# Patient Record
Sex: Male | Born: 1935 | Race: White | Hispanic: No | Marital: Married | Smoking: Former smoker
Health system: Southern US, Community
[De-identification: ages and names within clinical notes are randomized; demographics above are authoritative.]

## PROBLEM LIST (undated history)

## (undated) DIAGNOSIS — J9621 Acute and chronic respiratory failure with hypoxia: Principal | ICD-10-CM

## (undated) DIAGNOSIS — M1990 Unspecified osteoarthritis, unspecified site: Secondary | ICD-10-CM

## (undated) DIAGNOSIS — I2510 Atherosclerotic heart disease of native coronary artery without angina pectoris: Secondary | ICD-10-CM

## (undated) DIAGNOSIS — I4821 Permanent atrial fibrillation: Secondary | ICD-10-CM

## (undated) DIAGNOSIS — E782 Mixed hyperlipidemia: Secondary | ICD-10-CM

## (undated) DIAGNOSIS — I10 Essential (primary) hypertension: Secondary | ICD-10-CM

## (undated) HISTORY — DX: Mixed hyperlipidemia: E78.2

## (undated) HISTORY — DX: Permanent atrial fibrillation: I48.21

## (undated) HISTORY — PX: JOINT REPLACEMENT: SHX530

## (undated) HISTORY — DX: Essential (primary) hypertension: I10

## (undated) HISTORY — DX: Atherosclerotic heart disease of native coronary artery without angina pectoris: I25.10

---

## 2005-09-04 ENCOUNTER — Ambulatory Visit: Payer: Self-pay | Admitting: *Deleted

## 2005-09-10 ENCOUNTER — Encounter (HOSPITAL_COMMUNITY): Admission: RE | Admit: 2005-09-10 | Discharge: 2005-10-10 | Payer: Self-pay | Admitting: *Deleted

## 2005-09-10 ENCOUNTER — Ambulatory Visit: Payer: Self-pay | Admitting: *Deleted

## 2005-09-10 ENCOUNTER — Encounter: Payer: Self-pay | Admitting: Cardiology

## 2005-09-11 ENCOUNTER — Encounter: Payer: Self-pay | Admitting: Cardiology

## 2005-09-12 ENCOUNTER — Encounter: Payer: Self-pay | Admitting: Cardiology

## 2005-09-12 ENCOUNTER — Ambulatory Visit: Payer: Self-pay | Admitting: *Deleted

## 2005-09-12 ENCOUNTER — Ambulatory Visit (HOSPITAL_COMMUNITY): Admission: RE | Admit: 2005-09-12 | Discharge: 2005-09-12 | Payer: Self-pay | Admitting: *Deleted

## 2005-09-20 ENCOUNTER — Ambulatory Visit: Payer: Self-pay | Admitting: Cardiology

## 2005-09-20 ENCOUNTER — Encounter: Payer: Self-pay | Admitting: Cardiology

## 2005-09-20 ENCOUNTER — Inpatient Hospital Stay (HOSPITAL_BASED_OUTPATIENT_CLINIC_OR_DEPARTMENT_OTHER): Admission: RE | Admit: 2005-09-20 | Discharge: 2005-09-20 | Payer: Self-pay | Admitting: Cardiology

## 2005-09-26 ENCOUNTER — Ambulatory Visit: Payer: Self-pay | Admitting: *Deleted

## 2006-11-04 ENCOUNTER — Ambulatory Visit: Payer: Self-pay | Admitting: Cardiology

## 2007-10-23 ENCOUNTER — Ambulatory Visit: Payer: Self-pay | Admitting: Cardiology

## 2008-10-27 ENCOUNTER — Ambulatory Visit: Payer: Self-pay | Admitting: Cardiology

## 2009-11-07 DIAGNOSIS — E785 Hyperlipidemia, unspecified: Secondary | ICD-10-CM

## 2009-11-07 DIAGNOSIS — I10 Essential (primary) hypertension: Secondary | ICD-10-CM

## 2009-11-07 DIAGNOSIS — I2510 Atherosclerotic heart disease of native coronary artery without angina pectoris: Secondary | ICD-10-CM

## 2009-11-09 ENCOUNTER — Ambulatory Visit: Payer: Self-pay | Admitting: Cardiology

## 2009-12-26 ENCOUNTER — Encounter: Payer: Self-pay | Admitting: Cardiology

## 2010-07-24 ENCOUNTER — Encounter: Payer: Self-pay | Admitting: Cardiology

## 2010-09-28 ENCOUNTER — Encounter: Payer: Self-pay | Admitting: Cardiology

## 2010-11-08 ENCOUNTER — Encounter: Payer: Self-pay | Admitting: Cardiology

## 2010-11-08 ENCOUNTER — Ambulatory Visit
Admission: RE | Admit: 2010-11-08 | Discharge: 2010-11-08 | Payer: Self-pay | Source: Home / Self Care | Attending: Cardiology | Admitting: Cardiology

## 2010-11-08 DIAGNOSIS — I4821 Permanent atrial fibrillation: Secondary | ICD-10-CM | POA: Insufficient documentation

## 2010-11-14 ENCOUNTER — Encounter (INDEPENDENT_AMBULATORY_CARE_PROVIDER_SITE_OTHER): Payer: Self-pay | Admitting: *Deleted

## 2010-11-14 NOTE — Assessment & Plan Note (Signed)
Summary: yearly   Visit Type:  Follow-up Primary Provider:  DR [name]  CC:  no complaints.  History of Present Illness: Craig [name] returned for followup management of his coronary heart disease and hypertension.  In 2006, he was evaluated by Dr. Dorethea Clan because of an abnormal electrocardiogram showing anterior T- wave changes.  He had an echocardiogram which showed LVH, but good LV systolic function.  He had a catheterization which showed mild nonobstructive disease with 30% lesions in several areas.  He has done well since that time.  He preferred to follow up with me and I have seen him annually for the last couple of years.    He said he has had no recent chest pain, shortness of breath, or palpitations.   Current Medications (verified): 1)  Furosemide 20 Mg Tabs (Furosemide) .... Take One Tablet By Mouth Daily. 2)  Doxazosin Mesylate 4 Mg Tabs (Doxazosin Mesylate) .Marland Kitchen.. 1 Tab Once Daily 3)  Gemfibrozil 600 Mg Tabs (Gemfibrozil) .Marland Kitchen.. 1 Tab Two Times A Day 4)  Metoprolol Succinate 25 Mg Xr24h-Tab (Metoprolol Succinate) .... Take One Tablet By Mouth Daily 5)  Lipitor 20 Mg Tabs (Atorvastatin Calcium) .... Take One Tablet By Mouth Daily. 6)  Amlodipine Besylate 5 Mg Tabs (Amlodipine Besylate) .... Take One Tablet By Mouth Daily 7)  Nu Iron 150mg  .... 1 Tab Two Times A Day 8)  Clotramazole .... Cream For Dry Skin 9)  Bethamethasone .... Cream Dry Skin 10)  Dipropionate .... Cream For Dry Skin  Allergies (verified): No Known Drug Allergies  Past History:  Past Medical History: Reviewed history from 11/07/2009 and no changes required. Current Problems:  CAD (ICD-414.00) HYPERLIPIDEMIA (ICD-272.4) HYPERTENSION (ICD-401.9)  Review of Systems       ROS is negative except as outlined in HPI.   Vital Signs:  Patient profile:   76 year old male Height:      73 inches Weight:      229 pounds BMI:     30.32 Pulse rate:   74 / minute BP sitting:   150 / 77  (left  arm) Cuff size:   large  Vitals Entered By: Burnett Kanaris, CNA (November 09, 2009 11:20 AM)  Physical Exam  Additional Exam:  Gen. Well-nourished, in no distress   Neck: No JVD, thyroid not enlarged, no carotid bruits Lungs: No tachypnea, clear without rales, rhonchi or wheezes Cardiovascular: Rhythm regular, PMI not displaced,  heart sounds  normal, no murmurs or gallops, no peripheral edema, pulses normal in all 4 extremities. Abdomen: BS normal, abdomen soft and non-tender without masses or organomegaly, no hepatosplenomegaly. MS: No deformities, no cyanosis or clubbing   Neuro:  No focal sns   Skin:  no lesions    Impression & Recommendations:  Problem # 1:  HYPERTENSION (ICD-401.9) His BP is up today and we will increase the amlodipine.  Our target is SBP < 130-135. His updated medication list for this problem includes:    Furosemide 20 Mg Tabs (Furosemide) .Marland Kitchen... Take one tablet by mouth daily.    Doxazosin Mesylate 4 Mg Tabs (Doxazosin mesylate) .Marland Kitchen... 1 tab once daily    Metoprolol Succinate 25 Mg Xr24h-tab (Metoprolol succinate) .Marland Kitchen... Take one tablet by mouth daily    Amlodipine Besylate 10 Mg Tabs (Amlodipine besylate) .Marland Kitchen... Take one tablet by mouth daily  Orders: EKG w/ Interpretation (93000)  Problem # 2:  HYPERLIPIDEMIA (ICD-272.4) This will be checked by Dr Sherril Croon. His updated medication list for this problem includes:  Gemfibrozil 600 Mg Tabs (Gemfibrozil) .Marland Kitchen... 1 tab two times a day    Lipitor 20 Mg Tabs (Atorvastatin calcium) .Marland Kitchen... Take one tablet by mouth daily.  Orders: EKG w/ Interpretation (93000)  Problem # 3:  CAD (ICD-414.00) He has non-obst CAD.  He should be managed as secondary prevention. His updated medication list for this problem includes:    Metoprolol Succinate 25 Mg Xr24h-tab (Metoprolol succinate) .Marland Kitchen... Take one tablet by mouth daily    Amlodipine Besylate 10 Mg Tabs (Amlodipine besylate) .Marland Kitchen... Take one tablet by mouth daily  Orders: EKG  w/ Interpretation ([address])  Patient Instructions: 1)  Your physician has recommended you make the following change in your medication: 1) Increase Norvasc (amlodipine) to 10mg  once daily.  2)  Your physician wants you to follow-up in: 1 year with Dr. Lewayne Bunting in the [place] office.  You will receive a reminder letter in the mail two months in advance. If you don't receive a letter, please call our office to schedule the follow-up appointment. Prescriptions: AMLODIPINE BESYLATE 10 MG TABS (AMLODIPINE BESYLATE) Take one tablet by mouth daily  #90 x 3   Entered by:   Sherri Rad, RN, BSN   Authorized by:   Lenoria Farrier, MD, [facility]   Signed by:   Sherri Rad, RN, BSN on 11/09/2009   Method used:   Electronically to        [facility] (mail-order)       [address]       [place], [place]  [address]       Ph: [phone]       Fax: [phone]   RxID:   [id number] LIPITOR 20 MG TABS (ATORVASTATIN CALCIUM) Take one tablet by mouth daily.  #90 x 3   Entered by:   Sherri Rad, RN, BSN   Authorized by:   Lenoria Farrier, MD, [facility]   Signed by:   Sherri Rad, RN, BSN on 11/09/2009   Method used:   Electronically to        [facility] (mail-order)       [address]       [place], [place]  [address]       Ph: [phone]       Fax: [phone]   RxID:   [id number] METOPROLOL SUCCINATE 25 MG XR24H-TAB (METOPROLOL SUCCINATE) Take one tablet by mouth daily  #90 x 3   Entered by:   Sherri Rad, RN, BSN   Authorized by:   Lenoria Farrier, MD, [facility]   Signed by:   Sherri Rad, RN, BSN on 11/09/2009   Method used:   Electronically to        [facility] (mail-order)       [address]       [place], [place]  [address]       Ph: [phone]       Fax: [phone]   RxID:   [id number] GEMFIBROZIL 600 MG TABS (GEMFIBROZIL) 1 tab two times a day  #180 x 3   Entered by:   Sherri Rad, RN, BSN   Authorized  by:   Lenoria Farrier, MD, [facility]   Signed by:   Sherri Rad, RN, BSN on 11/09/2009   Method used:   Electronically to        [facility] (mail-order)       [address]       [place], [place]  [address]  Ph: [phone]       Fax: [phone]   RxID:   [id number] DOXAZOSIN MESYLATE 4 MG TABS (DOXAZOSIN MESYLATE) 1 tab once daily  #90 x 3   Entered by:   Sherri Rad, RN, BSN   Authorized by:   Lenoria Farrier, MD, [facility]   Signed by:   Sherri Rad, RN, BSN on 11/09/2009   Method used:   Electronically to        [facility] (mail-order)       [address]       [place], [place]  [address]       Ph: [phone]       Fax: [phone]   RxID:   [id number] FUROSEMIDE 20 MG TABS (FUROSEMIDE) Take one tablet by mouth daily.  #90 x 3   Entered by:   Sherri Rad, RN, BSN   Authorized by:   Lenoria Farrier, MD, [facility]   Signed by:   Sherri Rad, RN, BSN on 11/09/2009   Method used:   Electronically to        [facility] (mail-order)       [address]       [place], [place]  [address]       Ph: [phone]       Fax: [phone]   RxID:   [id number]

## 2010-11-15 ENCOUNTER — Ambulatory Visit: Payer: Self-pay

## 2010-11-16 NOTE — Cardiovascular Report (Signed)
Summary: Cardiac Catheterization  Cardiac Catheterization   Imported By: Dorise Hiss 11/08/2010 09:11:24  _____________________________________________________________________  External Attachment:    Type:   Image     Comment:   External Document

## 2010-11-16 NOTE — Assessment & Plan Note (Signed)
Summary: 1 yr fu per jan reminder   Visit Type:  Follow-up Primary Provider:  DR [name]  CC:  patient need surgical clearance left hip replacement.  History of Present Illness: the patient is a 76 year old male a former patient of Dr. Charlies Constable.  The patient has been evaluated several years ago in 2006 with a cardiac catheterization because of an abnormal EKG with significant T wave inversions.  The patient had nonobstructive coronary artery disease.  The patient brought all the way from [facility] and has not transferred to his care to the [facility] office because of Dr. Marian Sorrow retirement.  The patient states that he's been doing well.  He denies any chest pain shortness of breath orthopnea or PND.  However he is limited in his physical activity due " hip pain".  A matter of fact the patient is contemplating possible future surgery on the right hip joint.  Routine EKG today and on physical examination we established that the patient has new onset atrial fibrillation.  Duration is unknown.  The patient is entirely asymptomatic with this.  His heart rate appears to be controlled at 96 beats/min.  He reports no palpitations.  He has not had presyncope or syncope.  The patient's score for thromboembolic risk is low at one.  He only has a history of hypertension.  Prior echocardiogram demonstrated normal LV function and the patient has no clinical findings or historical findings of congestive heart failure to suggest that he may have developed tachycardia-induced cardiomyopathy  Preventive Screening-Counseling & Management  Alcohol-Tobacco     Smoking Status: quit     Year Quit: 1984  Current Medications (verified): 1)  Furosemide 20 Mg Tabs (Furosemide) .... Take One Tablet By Mouth Daily. 2)  Doxazosin Mesylate 4 Mg Tabs (Doxazosin Mesylate) .Marland Kitchen.. 1 Tab Once Daily 3)  Gemfibrozil 600 Mg Tabs (Gemfibrozil) .Marland Kitchen.. 1 Tab Two Times A Day 4)  Metoprolol Succinate 25 Mg Xr24h-Tab (Metoprolol  Succinate) .... Take One Tablet By Mouth Daily 5)  Lipitor 20 Mg Tabs (Atorvastatin Calcium) .... Take One Tablet By Mouth Daily. 6)  Amlodipine Besylate 10 Mg Tabs (Amlodipine Besylate) .... Take One Tablet By Mouth Daily 7)  Bethamethasone .... Cream Dry Skin 8)  Allopurinol 300 Mg Tabs (Allopurinol) .... Take 1 Tablet By Mouth Once A Day 9)  Colcrys 0.6 Mg Tabs (Colchicine) .... Take 1 Tablet By Mouth Two Times A Day 10)  Aspir-Low 81 Mg Tbec (Aspirin) .... Take 1 Tablet By Mouth Once A Day  Allergies (verified): No Known Drug Allergies  Comments:  Nurse/Medical Assistant: The patient's medication list and allergies were reviewed with the patient and were updated in the Medication and Allergy Lists.  Past History:  Risk Factors: Smoking Status: quit (11/08/2010)  Past Medical History: Current Problems:  CAD (ICD-414.00) cardiac catheterization in 2006 with no obstructive coronary arteries disease normal LV function. HYPERLIPIDEMIA (ICD-272.4) HYPERTENSION (ICD-401.9) atrial fibrillation new onset diagnosed her clinic visits November 07, 2009  Family History: Reviewed history and no changes required. Negative FH of Diabetes, Hypertension, or Coronary Artery Disease  Social History: Reviewed history from 11/07/2009 and no changes required. His daughter Amy ,who has been a PA, I think is living now in [place], expecting her second child.Smoking Status:  quit  Review of Systems  The patient denies fatigue, malaise, fever, weight gain/loss, vision loss, decreased hearing, hoarseness, chest pain, palpitations, shortness of breath, prolonged cough, wheezing, sleep apnea, coughing up blood, abdominal pain, blood in stool, nausea,  vomiting, diarrhea, heartburn, incontinence, blood in urine, muscle weakness, joint pain, leg swelling, rash, skin lesions, headache, fainting, dizziness, depression, anxiety, enlarged lymph nodes, easy bruising or bleeding, and environmental  allergies.         right-sided hip pain  Vital Signs:  Patient profile:   76 year old male Height:      73 inches Weight:      218 pounds BMI:     28.87 Pulse rate:   98 / minute BP sitting:   127 / 83  (left arm) Cuff size:   large  Vitals Entered By: Carlye Grippe (November 08, 2010 9:08 AM)  Nutrition Counseling: Patient's BMI is greater than 25 and therefore counseled on weight management options. CC: patient need surgical clearance left hip replacement   Physical Exam  Additional Exam:  General: Well-developed, well-nourished in no distress head: Normocephalic and atraumatic eyes PERRLA/EOMI intact, conjunctiva and lids normal nose: No deformity or lesions mouth normal dentition, normal posterior pharynx neck: Supple, no JVD.  No masses, thyromegaly or abnormal cervical nodes lungs: Normal breath sounds bilaterally without wheezing.  Normal percussion heart: irregular rate and rhythm with normal S1 and S2, no S3 or S4.  PMI is normal.  No pathological murmurs abdomen: Normal bowel sounds, abdomen is soft and nontender without masses, organomegaly or hernias noted.  No hepatosplenomegaly musculoskeletal: Back normal, normal gait muscle strength and tone normal pulsus: Pulse is normal in all 4 extremities Extremities: No peripheral pitting edema neurologic: Alert and oriented x 3 skin: Intact without lesions or rashes cervical nodes: No significant adenopathy psychologic: Normal affect    EKG  Procedure date:  11/08/2010  Findings:      atrial fibrillation heart rate 96 beats/min.  Chronic T wave inversions in inferolateral leads no change from prior tracing  Impression & Recommendations:  Problem # 1:  CAD (ICD-414.00) the patient has nonobstructive coronary artery disease.  He has no chest pain.  No further workup is needed.  He needs to continue with risk factor modification.  His EKG does shows significant T-wave inversions which are old and have bled in the  past for cardiac catheterization.  I provided the patient and copy of his EKG to carry with him at all times His updated medication list for this problem includes:    Metoprolol Succinate 25 Mg Xr24h-tab (Metoprolol succinate) .Marland Kitchen... Take one tablet by mouth daily    Amlodipine Besylate 10 Mg Tabs (Amlodipine besylate) .Marland Kitchen... Take one tablet by mouth daily    Aspir-low 81 Mg Tbec (Aspirin) .Marland Kitchen... Take 1 tablet by mouth once a day  Orders: EKG w/ Interpretation ([address])  Problem # 2:  HYPERLIPIDEMIA (ICD-272.4) followed by Dr. Sherril Croon.  patient is scheduled for blood work next week.really referred her management to his primary care physician His updated medication list for this problem includes:    Gemfibrozil 600 Mg Tabs (Gemfibrozil) .Marland Kitchen... 1 tab two times a day    Lipitor 20 Mg Tabs (Atorvastatin calcium) .Marland Kitchen... Take one tablet by mouth daily.  Problem # 3:  HYPERTENSION (ICD-401.9) blood pressure is well controlled.  There is no need to change her medications. His updated medication list for this problem includes:    Furosemide 20 Mg Tabs (Furosemide) .Marland Kitchen... Take one tablet by mouth daily.    Doxazosin Mesylate 4 Mg Tabs (Doxazosin mesylate) .Marland Kitchen... 1 tab once daily    Metoprolol Succinate 25 Mg Xr24h-tab (Metoprolol succinate) .Marland Kitchen... Take one tablet by mouth daily    Amlodipine Besylate  10 Mg Tabs (Amlodipine besylate) .Marland Kitchen... Take one tablet by mouth daily    Aspir-low 81 Mg Tbec (Aspirin) .Marland Kitchen... Take 1 tablet by mouth once a day  Problem # 4:  FIBRILLATION, ATRIAL (ICD-427.31)  the patient has developed new onset atrial fibrillation since his last office visit with Dr. Dickie La.  However the duration is unknown the patient is entirely asymptomatic with this rhythm.  His heart rate at rest also appears to be well-controlled.  However we do not know on physical activity and doing daily activity if he has reasonable rate control.  We will provide him with a 48-hour Holter monitor and maintain his heart  rate being 90 to 110 beats/min.  He may not need any further adjustments in rate controlling agents.  There is no evidence clinically that the patient has a tachycardia-induced cardiomyopathy.  We also discussed the risk of stroke associated with this rhythm.  The patient understands that his risk for stroke is relatively low and only has one risk factor which is hypertension.  At this point we will hold off on anticoagulation after discussing the pros and cons with the patient, but will reconsider anticoagulation at age 18 when he picks up an additional risk factor. His updated medication list for this problem includes:    Metoprolol Succinate 25 Mg Xr24h-tab (Metoprolol succinate) .Marland Kitchen... Take one tablet by mouth daily    Aspir-low 81 Mg Tbec (Aspirin) .Marland Kitchen... Take 1 tablet by mouth once a day  Orders: Holter Monitor (Holter Monitor) T-TSH [phone])  Patient Instructions: 1)  48 hour holter monitor  2)  Follow up in  6 months     Other Screening   PSA: Not documented   Smoking status: quit  (11/08/2010)   Hypertension   Last Blood Pressure: 127 / 83  (11/08/2010)   Serum creatinine: Not documented   Serum potassium Not documented  Self-Management Support :    Hypertension self-management support: Not documented    Lipid self-management support: Not documented

## 2010-11-17 ENCOUNTER — Encounter: Payer: Self-pay | Admitting: Cardiology

## 2010-11-17 ENCOUNTER — Ambulatory Visit (INDEPENDENT_AMBULATORY_CARE_PROVIDER_SITE_OTHER): Payer: Medicare Other

## 2010-11-17 DIAGNOSIS — I4891 Unspecified atrial fibrillation: Secondary | ICD-10-CM

## 2010-11-18 DIAGNOSIS — I4891 Unspecified atrial fibrillation: Secondary | ICD-10-CM

## 2010-11-22 ENCOUNTER — Encounter: Payer: Self-pay | Admitting: Cardiology

## 2010-11-22 NOTE — Letter (Signed)
Summary: Letter/ [facility]  Letter/ [facility]   Imported By: Dorise Hiss 11/16/2010 16:07:38  _____________________________________________________________________  External Attachment:    Type:   Image     Comment:   External Document

## 2010-11-22 NOTE — Letter (Signed)
Summary: Engineer, materials at [facility]  [address]   [place], [place] [address]   Phone: [phone]  Fax: [phone]        November 14, 2010 MRN: [id number]   [facility] [address] [place], [place]  [address]   Dear Mr. [facility],  Your test ordered by Selena Batten has been reviewed by your physician (or physician assistant) and was found to be normal or stable. Your physician (or physician assistant) felt no changes were needed at this time.  ____ Echocardiogram  ____ Cardiac Stress Test  __X__ Lab Work - TSH (thyroid)  ____ Peripheral vascular study of arms, legs or neck  ____ CT scan or X-ray  ____ Lung or Breathing test  ____ Other:   Thank you.   Hoover Brunette, LPN    Duane Boston, M.D., F.A.C.C. Thressa Sheller, M.D., F.A.C.C. Oneal Grout, M.D., F.A.C.C. Cheree Ditto, M.D., F.A.C.C. Daiva Nakayama, M.D., F.A.C.C. Kenney Houseman, M.D., F.A.C.C. Jeanne Ivan, PA-C

## 2010-11-30 NOTE — Assessment & Plan Note (Signed)
Summary: 48 HOUR HOLTER-JM  CC: 48 holter placed   CC:  48 holter placed.  Allergies: No Known Drug Allergies

## 2010-12-06 NOTE — Procedures (Addendum)
Summary: Holter and Event/ [facility]ARD[facility]  Holter and Event/ [facility]   Imported By: Dorise Hiss 12/01/2010 16:55:56  _____________________________________________________________________  External Attachment:    Type:   Image     Comment:   External Document  Appended Document: Holter and Event/ [facility] increase metoprolol E. R. 50 milligrams p.o. daily.  Patientneeds to take his dose early in the morning.folder shows episodes of rapid atrial for relation around 6 p.m. and again around 6 a.m. in the morning.  Appended Document: Holter and Event/ [facility] Patient informed of the above. new rx sent to [facility]

## 2011-02-14 ENCOUNTER — Telehealth: Payer: Self-pay | Admitting: Cardiology

## 2011-02-14 NOTE — Telephone Encounter (Signed)
All Cardiac faxed to [facility] @ [phone]  02/14/11/km

## 2011-02-22 ENCOUNTER — Encounter (HOSPITAL_COMMUNITY): Payer: Medicare Other

## 2011-02-22 ENCOUNTER — Other Ambulatory Visit: Payer: Self-pay | Admitting: Orthopedic Surgery

## 2011-02-22 ENCOUNTER — Other Ambulatory Visit (HOSPITAL_COMMUNITY): Payer: Self-pay | Admitting: Orthopedic Surgery

## 2011-02-22 ENCOUNTER — Ambulatory Visit (HOSPITAL_COMMUNITY)
Admission: RE | Admit: 2011-02-22 | Discharge: 2011-02-22 | Disposition: A | Discharge status: Home / Self Care | Payer: Medicare Other | Source: Ambulatory Visit | Attending: Orthopedic Surgery | Admitting: Orthopedic Surgery

## 2011-02-22 DIAGNOSIS — Z01812 Encounter for preprocedural laboratory examination: Secondary | ICD-10-CM | POA: Insufficient documentation

## 2011-02-22 DIAGNOSIS — Z01818 Encounter for other preprocedural examination: Secondary | ICD-10-CM | POA: Insufficient documentation

## 2011-02-22 DIAGNOSIS — I4891 Unspecified atrial fibrillation: Secondary | ICD-10-CM | POA: Insufficient documentation

## 2011-02-22 DIAGNOSIS — I10 Essential (primary) hypertension: Secondary | ICD-10-CM | POA: Insufficient documentation

## 2011-02-22 LAB — COMPREHENSIVE METABOLIC PANEL
ALT: 10 U/L (ref 0–53)
AST: 18 U/L (ref 0–37)
Albumin: 4.1 g/dL (ref 3.5–5.2)
Alkaline Phosphatase: 101 U/L (ref 39–117)
BUN: 12 mg/dL (ref 6–23)
CO2: 29 mEq/L (ref 19–32)
Calcium: 10.8 mg/dL — ABNORMAL HIGH (ref 8.4–10.5)
Chloride: 103 mEq/L (ref 96–112)
Creatinine, Ser: 1.06 mg/dL (ref 0.4–1.5)
GFR calc Af Amer: >60 mL/min (ref >60)
GFR calc non Af Amer: >60 mL/min (ref >60)
Glucose, Bld: 89 mg/dL (ref 70–99)
Potassium: 3.9 mEq/L (ref 3.5–5.1)
Sodium: 140 mEq/L (ref 135–145)
Total Bilirubin: 0.5 mg/dL (ref 0.3–1.2)
Total Protein: 7 g/dL (ref 6.0–8.3)

## 2011-02-22 LAB — URINALYSIS, ROUTINE W REFLEX MICROSCOPIC
Bilirubin Urine: NEGATIVE
Hgb urine dipstick: NEGATIVE
Ketones, ur: NEGATIVE mg/dL
Specific Gravity, Urine: 1.01 (ref 1.005–1.030)
Urobilinogen, UA: 0.2 mg/dL (ref 0.0–1.0)
pH: 6.5 (ref 5.0–8.0)

## 2011-02-22 LAB — DIFFERENTIAL
Basophils Absolute: 0 10*3/uL (ref 0.0–0.1)
Basophils Relative: 0 % (ref 0–1)
Eosinophils Absolute: 0.1 10*3/uL (ref 0.0–0.7)
Eosinophils Relative: 3 % (ref 0–5)
Lymphocytes Relative: 20 % (ref 12–46)
Lymphs Abs: 1 10*3/uL (ref 0.7–4.0)
Monocytes Absolute: 0.4 10*3/uL (ref 0.1–1.0)
Monocytes Relative: 9 % (ref 3–12)
Neutro Abs: 3.4 10*3/uL (ref 1.7–7.7)
Neutrophils Relative %: 69 % (ref 43–77)

## 2011-02-22 LAB — CBC
Hemoglobin: 15.7 g/dL (ref 13.0–17.0)
MCH: 33.3 pg (ref 26.0–34.0)
MCHC: 33.8 g/dL (ref 30.0–36.0)
MCV: 98.7 fL (ref 78.0–100.0)
Platelets: 183 10*3/uL (ref 150–400)
RBC: 4.71 MIL/uL (ref 4.22–5.81)

## 2011-02-22 LAB — PROTIME-INR
INR: 1.4 (ref 0.00–1.49)
Prothrombin Time: 17.4 seconds — ABNORMAL HIGH (ref 11.6–15.2)

## 2011-02-22 LAB — SURGICAL PCR SCREEN
MRSA, PCR: NEGATIVE
Staphylococcus aureus: NEGATIVE

## 2011-02-22 LAB — APTT: aPTT: 45 seconds — ABNORMAL HIGH (ref 24–37)

## 2011-02-27 NOTE — Assessment & Plan Note (Signed)
[facility]                            CARDIOLOGY OFFICE NOTE   NAME:Craig [name], Craig [name]                        MRN:          [id number]  DATE:10/23/2007                            DOB:          1935-10-02    Primary care physician is previously Dr. Eliberto Ivory.   CLINICAL HISTORY:  Craig [name] is 76 years old and was evaluated by Dr.  Dorethea Clan in 2006 with an abnormal ECG.  He had an echocardiogram which  showed LVH but was otherwise normal and a Cardiolite scan which  suggested inferior ischemia.  He had a catheterization and was found to  have mild nonobstructive coronary disease with normal LV function.   He has requested cardiac follow-up here since that time and I saw him  last year and we did not make any change in his therapy.  He has had no  recent chest pain, shortness of breath or palpitations.   PAST MEDICAL HISTORY:  1. Hypertension.  2. Hyperlipidemia.  3. He had previously been a smoker.   CURRENT MEDICATIONS:  Doxazosin, furosemide, gemfibrozil, aspirin,  Toprol and Lipitor.   EXAMINATION:  The blood pressure was 118/90.  There was no venous distension.  Carotid pulses were full without  bruits.  CHEST:  Clear.  The heart rhythm was regular.  No murmurs or gallops.  ABDOMEN:  Soft.  Normal bowel sounds.  There was no hepatosplenomegaly.  Peripheral pulses were full with no peripheral edema.   IMPRESSION:  1. Nonobstructive coronary artery disease.  2. Hypertension with left ventricular hypertrophy by echocardiography.  3. Abnormal electrocardiogram.  4. Hyperlipidemia.   RECOMMENDATIONS:  I think Craig [name] is doing well.  He is scheduled for  a complete physical in [place] next week by Dr. Eliberto Ivory or another doctor in  [place] next week.  We will not  make any change in his medications here today and will plan to see him  back in follow-up in a year.     [name], MD, [facility]  Electronically Signed    BRB/MedQ  DD: 10/23/2007  DT:  10/24/2007  Job #: [id number]   cc:   Weyman Pedro

## 2011-02-27 NOTE — Assessment & Plan Note (Signed)
[facility]                            CARDIOLOGY OFFICE NOTE   NAME:[name], Craig SABOL                        MRN:          [id number]  DATE:10/27/2008                            DOB:          Feb 14, 1935    PRIMARY CARE PHYSICIAN:  Doreen Beam, MD   CLINICAL HISTORY:  Craig [name] returned for followup management of his  coronary heart disease and hypertension.  In 2006, he was evaluated by  Dr. Dorethea [name] because of an abnormal electrocardiogram showing anterior T-  wave changes.  He had an echocardiogram which showed LVH, but good LV  systolic function.  He had a catheterization which showed mild  nonobstructive disease with 30% lesions in several areas.  He has done  well since that time.  He preferred to follow up with me and I have seen  him annually for the last couple of years.  He said he has had no recent  chest pain, shortness of breath, or palpitations.   PAST MEDICAL HISTORY:  Significant for hyperlipidemia and he has  previous history of smoker.  He also has some BPH.   CURRENT MEDICATIONS:  1. Doxazosin.  2. Furosemide 20 mg daily.  3. Gemfibrozil 600 mg b.i.d.  4. Aspirin 81 mg daily.  5. Toprol 25 mg daily.  6. Lipitor 20 mg nightly.   SOCIAL HISTORY:  His daughter Craig [name], who has been a PA, I think is living  now in [place], expecting her second child.   PHYSICAL EXAMINATION:  VITAL SIGNS:  Today, the blood pressure is 160/78  and pulse 85 and regular.  NECK:  There is no venous distention.  The carotid pulses were full  without bruits.  CHEST:  Clear.  CARDIAC:  Rhythm was regular.  I could hear no murmurs or gallops.  ABDOMEN:  Soft with normal bowel sounds.  There is no  hepatosplenomegaly.  EXTREMITIES:  Peripheral pulses are full with no peripheral edema.   Electrocardiogram showed sinus rhythm with anterior T-wave changes from  V3 to V6.   IMPRESSION:  1. Hypertension with left ventricular hypertrophy by echo and  ECG.  2. Hyperlipidemia.  3. Nonobstructive coronary artery disease at catheterization.   RECOMMENDATIONS:  I think Craig [name] is stable.  His blood pressure is  elevated and we know from his electrocardiogram and his echocardiogram  that he does have LVH, so I think we need to be more aggressive in  controlling his blood pressure.  I doubt going up on his Toprol will be  enough to control his blood pressure and I suggested adding amlodipine.  He has an appointment to see Dr. Sherril Croon in about 2 weeks for a complete  physical and he wanted to wait and see where his blood pressure was  there before making a final decision about starting the medicines.  We  gave him a prescription for amlodipine at 5 mg, but he will defer until  he sees Dr. Sherril Croon about implementing that.  We also documented coronary  artery disease at catheterization.  I think we should target an LDL and  cholesterol of below 80.  I will plan to see him back in followup in a  year.     Craig Elvera Lennox Juanda Chance, MD, [facility]  Electronically Signed    BRB/MedQ  DD: 10/27/2008  DT: 10/28/2008  Job #: [id number]   cc:   Doreen Beam, MD

## 2011-03-01 ENCOUNTER — Inpatient Hospital Stay (HOSPITAL_COMMUNITY): Payer: Medicare Other

## 2011-03-01 ENCOUNTER — Inpatient Hospital Stay (HOSPITAL_COMMUNITY)
Admission: RE | Admit: 2011-03-01 | Discharge: 2011-03-03 | DRG: 470 | Disposition: A | Discharge status: Home Health | Payer: Medicare Other | Source: Ambulatory Visit | Attending: Orthopedic Surgery | Admitting: Orthopedic Surgery

## 2011-03-01 DIAGNOSIS — M169 Osteoarthritis of hip, unspecified: Principal | ICD-10-CM | POA: Diagnosis present

## 2011-03-01 DIAGNOSIS — I10 Essential (primary) hypertension: Secondary | ICD-10-CM | POA: Diagnosis present

## 2011-03-01 DIAGNOSIS — R9431 Abnormal electrocardiogram [ECG] [EKG]: Secondary | ICD-10-CM | POA: Diagnosis present

## 2011-03-01 DIAGNOSIS — M1610 Unilateral primary osteoarthritis, unspecified hip: Principal | ICD-10-CM | POA: Diagnosis present

## 2011-03-01 DIAGNOSIS — D62 Acute posthemorrhagic anemia: Secondary | ICD-10-CM | POA: Diagnosis not present

## 2011-03-01 DIAGNOSIS — E785 Hyperlipidemia, unspecified: Secondary | ICD-10-CM | POA: Diagnosis present

## 2011-03-01 DIAGNOSIS — Z01812 Encounter for preprocedural laboratory examination: Secondary | ICD-10-CM

## 2011-03-01 DIAGNOSIS — I4891 Unspecified atrial fibrillation: Secondary | ICD-10-CM | POA: Diagnosis present

## 2011-03-01 LAB — ABO/RH: ABO/RH(D): O POS

## 2011-03-02 DIAGNOSIS — R9431 Abnormal electrocardiogram [ECG] [EKG]: Secondary | ICD-10-CM

## 2011-03-02 DIAGNOSIS — I4891 Unspecified atrial fibrillation: Secondary | ICD-10-CM

## 2011-03-02 LAB — CROSSMATCH
ABO/RH(D): O POS
Antibody Screen: NEGATIVE
Unit division: 0
Unit division: 0

## 2011-03-02 LAB — CARDIAC PANEL(CRET KIN+CKTOT+MB+TROPI)
Relative Index: 1.8 (ref 0.0–2.5)
Total CK: 249 U/L — ABNORMAL HIGH (ref 7–232)
Troponin I: <0.3 ng/mL (ref <0.30)

## 2011-03-02 LAB — CBC
HCT: 32.7 % — ABNORMAL LOW (ref 39.0–52.0)
MCH: 33.3 pg (ref 26.0–34.0)
MCHC: 33.6 g/dL (ref 30.0–36.0)
MCV: 99.1 fL (ref 78.0–100.0)
Platelets: 135 10*3/uL — ABNORMAL LOW (ref 150–400)
RBC: 3.3 MIL/uL — ABNORMAL LOW (ref 4.22–5.81)
WBC: 5.9 10*3/uL (ref 4.0–10.5)

## 2011-03-02 LAB — BASIC METABOLIC PANEL
BUN: 12 mg/dL (ref 6–23)
Chloride: 104 mEq/L (ref 96–112)
GFR calc non Af Amer: >60 mL/min (ref >60)
Glucose, Bld: 119 mg/dL — ABNORMAL HIGH (ref 70–99)
Potassium: 3.9 mEq/L (ref 3.5–5.1)

## 2011-03-02 NOTE — Procedures (Signed)
Craig [name], Craig [name]                 ACCOUNT NO.:  [id number]   MEDICAL RECORD NO.:  [id number]           PATIENT TYPE:   LOCATION:                                 FACILITY:   PHYSICIAN:  Vida Roller, M.D.   DATE OF BIRTH:  November 21, 1934   DATE OF PROCEDURE:  DATE OF DISCHARGE:                                  ECHOCARDIOGRAM   PRIMARY CARE PHYSICIAN:  Dr. Elby Beck, [place], [place].   INDICATIONS:  This is a gentleman with LVH on EKG and chest pain.  Technical  quality of this study is difficult.   M-MODE TRACINGS:  Aorta is 42 mm.   Left atrium is 50 mm.   Septum is 13 mm.   Posterior wall is 13 mm.   Left ventricular diastolic dimension is 42 mm.   Left ventricular systolic dimension 25 mm.   A 2D AND DOPPLER IMAGING:  The left ventricle is normal size with normal  systolic function.  Estimated ejection fraction 70-75%.  There is mild  concentric left ventricular hypertrophy.  No obvious wall motion  abnormalities are seen.   Right ventricle is normal size with normal systolic function.   Both atria are enlarged.   Aortic valve is sclerotic with no stenosis or regurgitation.   The mitral valve his mild annular calcification with no stenosis or  regurgitation.   Tricuspid valve has trace regurgitation.   There is no pericardial effusion.   The ascending aorta appears to be top normal in size.  The inferior vena  cava appears to be normal size.      Vida Roller, M.D.  Electronically Signed     JH/MEDQ  D:  09/10/2005  T:  09/11/2005  Job:  [id number]   cc:   Elby Beck, M.D.  [place], [place]

## 2011-03-02 NOTE — Cardiovascular Report (Signed)
[name], [name]                 ACCOUNT NO.:  [id number]   MEDICAL RECORD NO.:  [id number]          PATIENT TYPE:  OIB   LOCATION:  1962                         FACILITY:  [facility]   PHYSICIAN:  Charlies Constable, M.D. [facility] DATE OF BIRTH:  03-12-1935   DATE OF PROCEDURE:  09/20/2005  DATE OF DISCHARGE:                              CARDIAC CATHETERIZATION   CLINICAL HISTORY:  [name] is 76 years old and no prior known heart  disease.  I did have a history of hypertension.  He was seen for  preoperative evaluation prior to eye surgery by Dr. Sheffield Slider by Dr. Dorethea Clan.  His ECG was abnormal suggesting a possible old anterior infarction with  lateral T-wave changes.  He had an echocardiogram  which showed normal left  ventricular function and LVH and he had a Cardiolite scan which suggested  inferior ischemia.  For this reason, he was scheduled for evaluation with  cardiac catheterization.   PROCEDURE:  The procedure was done in the outpatient laboratory.  Procedure  was performed via the right femoral arteries and arterial sheath and 4  French preformed coronary catheters.  A front wall arterial puncture was  performed. Omnipaque contrast was used.  The patient tolerated the procedure  well and left the laboratory in satisfactory condition.   RESULTS:  Left main coronary artery:  The left main coronary artery was free  of significant disease.   Left anterior descending coronary artery:  The left anterior descending  coronary artery gave rise to a septal perforator and two diagonal branches.  Note it is irregular but there is no significant obstruction.   Circumflex coronary artery:  Circumflex artery gave rise to a large and  small marginal branch and two posterolateral branches.  Circumflex was  irregular and there was a 30% narrowing in the distal vessel before the  posterolateral branches.   Right coronary artery:  The right coronary artery is a fairly large vessel  that gave rise to a  conus branch, a right ventricular branch, a posterior  descending branch and three posterolateral branches.  The vessel had luminal  irregularities and there was 30% narrowing in the proximal and 30% narrowing  in the mid vessel.   Left ventriculogram:  The left ventriculogram was performed in the RAO  projection, showed good wall motion with no areas of hypokinesis.  The  estimated ejection fraction was 60%.   CONCLUSION:  Mild nonobstructive coronary artery disease with irregularities  in the left anterior descending, 30% narrowing in the distal circumflex  artery, 30% proximal and 30% mid stenosis in the right coronary artery and  normal left ventricular function.   RECOMMENDATIONS:  In view of these findings, I think the abnormal ECG is  probably best explained by hypertensive disease.  The abnormal Cardiolite  scan probably represents a false positive with inferior ischemia.  Will plan  reassurance, secondary risk factor modification and will clear the patient  to proceed with eye surgery.           ______________________________  Charlies Constable, M.D. [facility]     BB/MEDQ  D:  09/20/2005  T:  09/20/2005  Job:  [id number]   cc:   Vida Roller, M.D.  Fax: [phone]   Elby Beck, M.D.   Allen Norris, M.D.  Fax: [phone]   Cardiopulmonary Lab

## 2011-03-02 NOTE — Assessment & Plan Note (Signed)
[facility]                            CARDIOLOGY OFFICE NOTE   NAME:Mathena, Craig [name]                        MRN:          [id number]  DATE:11/04/2006                            DOB:          12-15-1934    NEW PATIENT VISIT   PRIMARY CARE PHYSICIAN:  Dr. Winona Legato.   CLINICAL HISTORY:  Craig [name] is 76 years old and was evaluated by Dr.  Dorethea [name] in late 2006 for a preoperative evaluation prior to eye surgery  by Craig [name].  His ECG was normal suggesting a possible old anterior  infarction with lateral T-wave changes.  An echocardiogram which showed  good LV function but LVH and then a Cardiolite scan which suggested  inferior ischemia.  He underwent catheterization in the outpatient  laboratory and was found to have nonobstructive disease with 30%  narrowing of the distal circumflex artery, 30% proximal, and 30% mid  stenosis in the right coronary artery, and normal LV function.   Craig [name] left practice in [place] and Craig [name] has formally been a  patient of Craig [name] but when he went to the hospital Craig [name] has  assumed his care.  He came here to get established with Cardiology here  in [place].  He says he has had no recent chest pain, shortness of  breath, or palpitations.   PAST MEDICAL HISTORY:  Significant for hypertension and hyperlipidemia.  He had previously been a smoker.   CURRENT MEDICATIONS:  Include:  1. Doxazosin.  2. Furosemide 20 mg daily.  3. Gemfibrozil.  4. Lipitor.  5. Aspirin.   SOCIAL HISTORY:  He is retired from [employer] in [place].  He is  married and has no children.  He knows Craig [name] who was a PA of ours  in [place] until she moved to [place].   FAMILY HISTORY:  Negative for heart disease.  His father died of  uncertain causes and his mother died of cancer.   REVIEW OF SYSTEMS:  Negative except for some urinary frequency.   EXAMINATION:  The blood pressure is 148/85 and the  pulse 93 and regular.  There was no venous distention.  The carotid pulses were full without  bruits.  CHEST:  Clear.  Cardiac rhythm was regular.  First and second heart sounds were normal.  There was a short, systolic ejection murmur in the left sternal edge.  I  could hear no diastolic murmur.  ABDOMEN:  Soft with normal bowel sounds.  There was no  hepatosplenomegaly.  The peripheral pulses were full and there was no peripheral edema.  MUSCULOSKELETAL:  No deformities.  SKIN:  Warm and dry.  NEUROLOGIC:  No focal neurological signs.   An electrocardiogram showed poor R-wave progression and lateral T-wave  changes.   IMPRESSION:  1. Nonobstructive coronary artery disease.  2. Hypertension with left ventricular hypertrophy by echocardiography      and an abnormal EKG.  3. Hyperlipidemia.   RECOMMENDATIONS:  Craig [name] appears to be stable from a cardiac  standpoint.  His blood pressure is somewhat up and I think he  would  benefit from a beta blocker both for secondary prevention for his  coronary heart disease and for a better control of his blood pressure.  He does have LVH on his echocardiogram indicating that his elevated  blood pressure is having a significant effect.  We will start him on  Toprol XL 25 daily.  He is to have his lipid profile checked by Dr.  Eliberto [name] next month.  I will plan to see him back in followup in a year.     [name], MD, [facility]  Electronically Signed    BRB/MedQ  DD: 11/04/2006  DT: 11/04/2006  Job #: [id number]   cc:   Craig [name]

## 2011-03-03 LAB — CBC
Hemoglobin: 10.5 g/dL — ABNORMAL LOW (ref 13.0–17.0)
Platelets: 117 10*3/uL — ABNORMAL LOW (ref 150–400)
RBC: 3.2 MIL/uL — ABNORMAL LOW (ref 4.22–5.81)
RDW: 13.5 % (ref 11.5–15.5)
WBC: 5.5 10*3/uL (ref 4.0–10.5)

## 2011-03-03 LAB — BASIC METABOLIC PANEL
BUN: 10 mg/dL (ref 6–23)
CO2: 26 mEq/L (ref 19–32)
Calcium: 9.9 mg/dL (ref 8.4–10.5)
Chloride: 109 mEq/L (ref 96–112)
Creatinine, Ser: 0.84 mg/dL (ref 0.4–1.5)
GFR calc Af Amer: >60 mL/min (ref >60)
GFR calc non Af Amer: >60 mL/min (ref >60)
Glucose, Bld: 118 mg/dL — ABNORMAL HIGH (ref 70–99)
Potassium: 4 mEq/L (ref 3.5–5.1)
Sodium: 139 mEq/L (ref 135–145)

## 2011-03-03 LAB — TSH: TSH: 1.525 u[IU]/mL (ref 0.350–4.500)

## 2011-03-08 NOTE — Consult Note (Signed)
NAMERUCKER, PRIDGEON                 ACCOUNT NO.:  [id number]  MEDICAL RECORD NO.:  [id number]           PATIENT TYPE:  I  LOCATION:  1618                         FACILITY:  [facility]  PHYSICIAN:  Pricilla Riffle, MD, FACCDATE OF BIRTH:  [birth year]  DATE OF CONSULTATION:  03/02/2011 DATE OF DISCHARGE:                                CONSULTATION   PRIMARY CARE PHYSICIAN:  Raphael Gibney, M.[place]. [place].  PRIMARY CARDIOLOGIST:  Learta Codding, M.D., [facility]  CHIEF COMPLAINT:  Atrial fibrillation after a left total hip replacement and an abnormal EKG.  HISTORY OF PRESENT ILLNESS:  Mr. Yom is a 76 year old male with a previous history of nonobstructive coronary artery disease.  He was seen in the office for preoperative evaluation in January 2012 and was noted to be in atrial fibrillation.  He had a Holter monitor after that, but those results are not available.  He is not particularly symptomatic from the atrial fibrillation and came in yesterday for the surgery. Postoperatively, he has done well and was ambulating with physical therapy and rehab today.  He did well with this.  He denies chest pain, dyspnea on exertion, presyncope or syncope.  He has not had lower extremity edema.  He denies orthopnea or PND.  He feels that his respiratory status has been unchanged.  He is aware of the atrial fibrillation and can feel the palpitations, but is not particularly symptomatic secondary to them.  He tolerates the arrhythmia very well.  PAST MEDICAL HISTORY: 1. History of an abnormal EKG with anterolateral T-wave inversions and     cardiac catheterization in 2006 showing no significant obstruction     in the LAD, circumflex and RCA 30% stenosis: 2. Preserved left ventricular function with an EF of 70%-75% and mild     concentric LVH by echocardiogram in 2006. 3. Atrial fibrillation diagnosed on a clinic visit in January 2012. 4. Remote history of tobacco use. 5. Hypertension. 6.  Hyperlipidemia. 7. BPH. 8. Osteoarthritis.  PAST SURGICAL HISTORY:  He is status post eye surgery remotely as well as hip surgery yesterday.  ALLERGIES:  No known drug allergies.  CURRENT MEDICATIONS: 1. Zyloprim 300 mg a day. 2. Norvasc 5 mg a day. 3. Ancef q.6h. 4. Celebrex 200 mg b.i.d. 5. Colace 100 mg b.i.d. 6. Cardura 8 mg a day. 7. Iron 325 mg t.i.d. 8. Lasix 20 mg a day. 9. Lopid 600 mg b.i.d. 10.Dilaudid p.r.n. 11.Toprol XL 50 mg a day. 12.Xarelto 10 mg daily. 13.Crestor 10 mg a day. 14.Senokot b.i.d. 15.Flomax 0.4 mg q.h.s.  SOCIAL HISTORY:  He lives in [place], [place] with family nearby. His niece, Amy Mercy Riding was a PA with our service until her husband was transferred to [place].  He quit tobacco back in 1984.  He is retired.  He has no history of alcohol or drug abuse.  FAMILY HISTORY:  Both of his parents are deceased and neither one had coronary artery disease, although his father's cause of death is not known.  No siblings have coronary artery disease.  REVIEW OF SYSTEMS:  He has had significant  joint pain in the left hip prior to surgery.  Since the surgery, his pain has been fairly well- controlled.  He has had no fevers or chills.  He denies lower extremity edema.  He denies melena or GI issues.  Full 14-point review of systems is otherwise negative except as stated in the HPI.  PHYSICAL EXAMINATION:  VITAL SIGNS:  Temperature 98.1, blood pressure 115/66, heart rate 104, respiratory rate 18, O2 saturation 97% on 2 liters. GENERAL:  He is a well-developed elderly white male, in no acute distress. HEENT:  Normal for age. NECK:  There is no lymphadenopathy, thyromegaly, bruit or JVD noted. CV:  His heart is irregular in rate and rhythm with an S1, S2 and a short soft systolic murmur is noted at the left upper sternal border. Distal pulses are intact in upper extremities. SKIN:  No rashes or lesions are noted. ABDOMEN:  Soft and  nontender with active bowel sounds. EXTREMITIES:  There is no cyanosis, clubbing or edema noted.  His incision is bandaged and dressed and without significant drainage. NEUROLOGIC:  He is alert and oriented.  Cranial nerves II through XII grossly intact.  DIAGNOSTIC STUDIES:  Chest x-ray performed on Feb 22, 2011 showed mild COPD, possible left lower lobe pneumonia.  EKG is AFib with RVR, rate 102 with anterolateral T-wave inversions that are not significantly changed from an EKG dated January 2012.  LABORATORY DATA:  Hemoglobin 11.0, hematocrit 32.7, WBC 5.9, platelets 135,000.  Sodium 136, potassium 3.9, chloride 104, CO2 of 26, BUN 12, creatinine 1.05, glucose 119, CK-MB 249/4.6 with an index of 1.8 and troponin I less than 0.30.  IMPRESSION: 1. Mr. Nix was seen today by Dr. Tenny Craw, the patient evaluated and the     data reviewed.  He is a 76 year old male with a history of mild     coronary artery disease, hypertension and atrial fibrillation, who     is postop day #1 of left total hip replacement.  We were asked to     see him regarding his atrial fibrillation.  He is relatively     asymptomatic.  His EKG is abnormal, but relatively unchanged from     previous studies.  With age, hypertension and history of coronary     artery disease, we recommend anticoagulation.  He is currently     getting Xarelto 10 mg and we will increase it to a full dose if     okay with ortho. 2. He should get an echocardiogram, which can be done as an     outpatient. 3. He should be continued on beta blocker and titrate as needed for     heart rate and blood pressure control.  The Norvasc was held this     morning for hypotension and we will continue to hold that in order     to make sure the beta blocker gets on board.  He should be     continued on aspirin and statin because of his history of coronary     artery disease albeit nonobstructive.     Theodore Demark,  PA-C   ______________________________ Pricilla Riffle, MD, [facility]    RB/MEDQ  D:  03/02/2011  T:  03/02/2011  Job:  [id number]  Electronically Signed by Theodore Demark PA-C on 03/08/2011 11:33:10 AM Electronically Signed by Dietrich Pates MD FACC on 03/08/2011 12:57:20 PM

## 2011-03-13 NOTE — Op Note (Signed)
NAMECARLON, CHALOUX                 ACCOUNT NO.:  [id number]  MEDICAL RECORD NO.:  [id number]           PATIENT TYPE:  I  LOCATION:  0005                         FACILITY:  [facility]  PHYSICIAN:  Grissel Tyrell L. Rendall, M.D.  DATE OF BIRTH:  07-04-1935  DATE OF PROCEDURE:  03/01/2011 DATE OF DISCHARGE:                              OPERATIVE REPORT   INDICATIONS AND JUSTIFICATION FOR PROCEDURE:  Painful left hip with bone- against-bone, unremitting pain despite all conservative measures.  PREOPERATIVE DIAGNOSIS:  Osteoarthritis of the left hip.  SURGICAL PROCEDURES:  Left total-hip arthroplasty.  POSTOPERATIVE DIAGNOSIS:  Osteoarthritis of the left hip.  SURGEON:  Tenise Stetler L. Rendall, M.D.  ASSISTANT:  Legrand Pitts. Duffy, P.A.C, present and participating in the entire procedure.  ANESTHESIA:  General.  PATHOLOGY:  Bone-against-bone left hip with an irregularly shaped left femoral head and minimally out of round acetabulum.  PROCEDURE IN DETAIL:  Under general anesthesia, the patient was placed in the right lateral decubitus position.  The hip was draped free with DuraPrep and a posterior approach was used.  The skin incision was carried down through skin and subcutaneous tissue through the IT band. The Charnley retractor was inserted.  Short external rotators and hip capsule were taken down from bone with electrocautery and bleeding was controlled.  Following this, the hip capsule was exposed with a Cobb elevator and opened in a T-shaped manner.  The hip was then dislocated. The superior femoral neck was then exposed with a Mueller and vented and once it was properly exposed, an IM initiator was used to open the canal just medial to the greater trochanter.  A canal finder was then used. Care was taken to be as far lateral as reasonably possible.  Reaming was then done and #5 on the reamers gives excellent canal fill at the level of the greater trochanter.  Femoral neck was then osteotomized  and care was taken to do this several times to get the appropriate neck length and rasps were then used 1, 3 and 5 and the calcar reamer was used to basically smooth this area.  Once the 5 stem rasp was in place and found to be in excellent fit and fill, attention was turned to the acetabulum. It was exposed with two cobras inferiorly and wing retractors superiorly in the interval between the labrum and the hip capsule.  Care was taken to remove the entire labrum.  Transverse acetabular ligament was exposed and mostly trimmed away.  Reaming was then done in the acetabulum 47, 49, 51, 53, 54, 55 and this leads to a excellent fit with ultimately the 58.  We reamed to a 57 briefly and a 58 sector cup was used.  There was a medial acetabular cyst that was encountered and this was packed full of cancellus bone from the femoral head neck.  Once this was packed and appropriately filled in, the pinnacle cup was inserted and one 20-mm screw with excellent bone grip was inserted into the superior hole into the ileum.  With this in place, trial acetabular liners were used, first the +4 10-degrees for a 36  hip ball and a 58 liner.  With the #5 rasp in place, different neck lengths were tried and the high offset with a +5 neck length seems to give the best fit and fill with the leg lengths virtually identical.  The hip was very stable.  With this in place, the hip did not dislocate with flexion and internal rotation or full extension either.  Permanent components were then obtained with the exception of the metal ball.  The apex hole eliminator was inserted, the Marathon polyethylene and the Summit femoral stem 12/14 taper and the size 5 high offset.  The different hip balls were then trialed, a +5 and a +8 and the +5 felt more natural.  This was then used and the leg lengths were felt to be identical.  The hip capsule was then repaired with #1 Tycron.  The piriformis was reattached.  A small incision  in the gluteus medius tendon was repaired.  This was done to avoid damage to the tendon during reaming.  The IT band was then closed with #1 Tycron, subcutaneous with #1 Vicryl, 2-0 Vicryl and skin clips.  Operative time approximately one hour to one hour and 20 minutes.  Blood loss estimated at 800.  The patient was stable through the procedure and returned to recovery in good condition.     Zailey Audia L. Priscille Kluver, M.D.     Renato Gails  D:  03/01/2011  T:  03/01/2011  Job:  [id number]  Electronically Signed by Erasmo Leventhal M.D. on 03/13/2011 05:37:54 PM

## 2011-03-28 NOTE — Discharge Summary (Addendum)
Craig, [name]                 ACCOUNT NO.:  [id number]  MEDICAL RECORD NO.:  [id number]  LOCATION:  1618                         FACILITY:  [facility]  PHYSICIAN:  Ardyth Kelso L. Rendall, M.D.  DATE OF BIRTH:  1934-12-19  DATE OF ADMISSION:  03/01/2011 DATE OF DISCHARGE:  03/03/2011                              DISCHARGE SUMMARY   ADMISSION DIAGNOSES: 1. End-stage osteoarthritis, left hip. 2. History of atrial fibrillation. 3. Hypertension. 4. Hypercholesterolemia. 5. Benign prostatic hypertrophy. 6. Gout.  DISCHARGE DIAGNOSES: 1. End-stage osteoarthritis left hip, status post left total hip     arthroplasty. 2. Acute blood loss anemia secondary to surgery. 3. New onset atrial fibrillation with a history of chronic atrial     fibrillation. 4. Hypertension. 5. Hypercholesterolemia. 6. Benign prostatic hypertrophy. 7. Gout.  SURGICAL PROCEDURE:  On Mar 01, 2011, Craig [name] underwent a left total hip arthroplasty by Dr. Jonny Ruiz L.  Rendall, assisted by Arnoldo Morale, PA-C. He had a DePuy Pinnacle acetabular shell sector 2, 58 mm outer diameter, placed with 1 Pinnacle cancellous bone screw 8.5 x 20 mm.  A apex hole eliminator.  A Pinnacle Marathon polyethylene acetabular liner +4 10 degree 36 mm inner diameter, 58 mm outer diameter.  A Summit femoral stem 12/14 taper with a fixed size 5 and a metal-on-metal femoral head +5 neck length, 12/14 taper.  COMPLICATIONS:  None.  CONSULTS: 1. Physical therapy consult, Mar 02, 2011, in addition to a [facility]     Cardiology consult. 2. Occupational therapy consult, Mar 03, 2011.  HISTORY OF PRESENT ILLNESS:  This 76 year old white male patient presented to Dr. Priscille [name] with a 1-1/2-year history of gradual-onset progressive left hip pain without injury or surgery.  Left hip pain is now constant with weightbearing and dull ache over the lateral hip and groin without radiation.  Pain increases with walking and prolonged sitting and decreases  with rest and some walking.  The hip pops, it might give way.  He can lie on his left side for a short period of time, but he has difficulty putting on his socks and shoes.  He is taking Naprosyn for some relief, but x-rays show end-stage arthritic changes and because of that he is presenting for a left hip replacement.  HOSPITAL COURSE:  Craig [name] tolerated his surgical procedure well without immediate postoperative complications.  He was transferred to the orthopedic floor.  Postop day #1, he was afebrile, vitals were stable, however, his pulse was irregular.  With his history of AFib, a new EKG was ordered and cardiology consult.  His hemoglobin was 11, hematocrit 32.7.  Left hip prosthesis was in good position, alignment, and the leg was neurovascularly intact.  He was started on therapy.  CK, CK-MB, and troponin were done and plans were made for discharge home over the next several days if stable.  Cardiology did see him and recommended medications adjustment and an outpatient echocardiogram.  Postop day #2, he continued to do well, remained afebrile, vitals were stable.  He was tolerating therapy well and no new cardiac issues. Postop, he was able to be discharge home later that day.  MEDICATIONS:  Please see the patient home  med rec discharge sheet.  We did add Celebrex, iron sulfate, Robaxin, Percocet, Xarelto for him. Again you please see the home med discharge sheet for complete documentation of the medications.  DISCHARGE INSTRUCTIONS:  Diet:  He is to resume his regular prehospitalization diet.  Activity:  He can be out of bed, weightbearing as tolerated on the left leg with use of a walker.  No lifting or driving for 6 weeks.  Please see the white total joint discharge sheet for further activity instructions.  Wound Care:  Please see the white total joint discharge sheet for further wound care instructions.  Followup:  He needs to follow up with Dr. Priscille [name] in our  office on Tuesday, May 29th, and needs to call [phone] for that appointment.  If that does not work, he can follow up in [place] with Dr. Cleophas Dunker and then follow up with [place].  He does need to follow up with Dr. Andee Lineman in about 4- 6 weeks.  LABORATORY DATA:  Hemoglobin/hematocrit ranged from 11 and 32.7 on the 18th to 10.5 and 31.7 on the 19th.  Platelets went from 135 on the 18th to 117 on the 19th.  Glucose ranged from 119 on the 18th to 118 on the 19th.  CK was 249 with a CK-MB of 4.6 and a troponin of less than 0.3 on May 18th.  All other laboratory studies were within normal limits.  X-rays taken of the left hip after the total hip done on May 17th showed the prosthesis in good position alignment.     Craig [name], P.A.   ______________________________ Carlisle Beers. Craig [name], M.D.    KED/MEDQ  D:  03/21/2011  T:  03/22/2011  Job:  [id number]  Electronically Signed by Otilio Jefferson. on 03/28/2011 10:33:46 AM Electronically Signed by Erasmo Leventhal M.D. on 03/28/2011 11:21:47 AM

## 2011-05-11 ENCOUNTER — Encounter: Payer: Self-pay | Admitting: Cardiology

## 2011-05-18 ENCOUNTER — Ambulatory Visit (INDEPENDENT_AMBULATORY_CARE_PROVIDER_SITE_OTHER): Payer: Medicare Other | Admitting: Cardiology

## 2011-05-18 ENCOUNTER — Encounter: Payer: Self-pay | Admitting: Cardiology

## 2011-05-18 VITALS — BP 145/81 | HR 97 | Ht 73.0 in | Wt 219.0 lb

## 2011-05-18 DIAGNOSIS — I4891 Unspecified atrial fibrillation: Secondary | ICD-10-CM

## 2011-05-18 DIAGNOSIS — I2510 Atherosclerotic heart disease of native coronary artery without angina pectoris: Secondary | ICD-10-CM

## 2011-05-18 DIAGNOSIS — I10 Essential (primary) hypertension: Secondary | ICD-10-CM

## 2011-05-18 DIAGNOSIS — R9431 Abnormal electrocardiogram [ECG] [EKG]: Secondary | ICD-10-CM

## 2011-05-18 MED ORDER — METOPROLOL SUCCINATE ER 50 MG PO TB24: ORAL_TABLET | ORAL | Status: DC

## 2011-05-18 NOTE — Assessment & Plan Note (Signed)
Nonobstructive CAD by catheterization 2006. No chest pain

## 2011-05-18 NOTE — Assessment & Plan Note (Signed)
Patient has chronically abnormal EKG. He carries a copy with him all the time. He has no significant coronary artery disease associated with this

## 2011-05-18 NOTE — Assessment & Plan Note (Signed)
Overall rate is reasonably controlled between 90-110 beats per minute period however there are still. For the patient's heart rate goes to 140-160. This occurs mainly in the evening and early morning hours we will add a small dose of metoprolol 25 mg to take in the late afternoon.

## 2011-05-18 NOTE — Assessment & Plan Note (Signed)
Blood pressure stable followup with his primary care physician

## 2011-05-18 NOTE — Patient Instructions (Signed)
   Increase Metoprolol to 50mg  every morning & 25mg  every evening Your physician wants you to follow up in:  1 year.  You will receive a reminder letter in the mail one-two months in advance.  If you don't receive a letter, please call our office to schedule the follow up appointment

## 2011-05-18 NOTE — Progress Notes (Signed)
HPI The patient is a 76 year old male former patient of Dr. Dickie La with prior abnormal echocardiogram with significant T wave inversions in the precordial leads. Had a cardiac catheterization in 2006 which showed nonobstructive coronary artery disease. Prior echocardiogram also demonstrated normal LV function. When seen for the first time in this office several months ago the patient was found to be in new-onset atrial fibrillation. We discussed implications of atrial fibrillation as it relates to stroke and risk for heart failure. We also discussed issues of rhythm and rate control. A 48-hour Holter monitor was obtained and the patient's overall heart rate was well-controlled. There were some increasing heart rates between 4 PM and 6 PM as well as early in the morning. The patient however denies any palpitations. He has no presyncope or syncope. He has no orthopnea PND. He stable from a cardiovascular perspective. The patient carries a copy of his EKG with him at all times in the event he presents to the emergency room with chest pain or acute comparison. Of note is that the patient status post hip replacement he 17th. He had no postoperative cardiac complications. He does report some gout in the left knee.   No Known Allergies  Current Outpatient Prescriptions on File Prior to Visit  Medication Sig Dispense Refill  . allopurinol (ZYLOPRIM) 300 MG tablet Take 300 mg by mouth daily.        Marland Kitchen aspirin 81 MG EC tablet Take 81 mg by mouth daily.        Marland Kitchen atorvastatin (LIPITOR) 20 MG tablet Take 20 mg by mouth daily.        . furosemide (LASIX) 20 MG tablet Take 20 mg by mouth daily.        Marland Kitchen gemfibrozil (LOPID) 600 MG tablet Take 600 mg by mouth 2 (two) times daily.        . NON FORMULARY bethamethasone - cream dry skin         Past Medical History  Diagnosis Date  . CAD (coronary artery disease)   . HLD (hyperlipidemia)   . HTN (hypertension)   . Atrial fibrillation      new onset diagnosed her  clinic visits 11/07/09    Past Surgical History  Procedure Date  . Cardiac catheterization 2006    no obstructive CAD; nml LV function     Family History  Problem Relation Age of Onset  . Diabetes Neg Hx   . Hypertension Neg Hx   . Coronary artery disease Neg Hx     History   Social History  . Marital Status: Married    Spouse Name: N/A    Number of Children: N/A  . Years of Education: N/A   Occupational History  . Not on file.   Social History Main Topics  . Smoking status: Former Smoker -- 1.0 packs/day for 30 years    Types: Cigarettes    Quit date: 10/15/1982  . Smokeless tobacco: Never Used  . Alcohol Use: Not on file  . Drug Use: Not on file  . Sexually Active: Not on file   Other Topics Concern  . Not on file   Social History Narrative  . No narrative on file   WUJ:WJXBJYNWG positives as outlined above. The remainder of the 18  point review of systems is negative   PHYSICAL EXAM BP 145/81  Pulse 97  Ht 6\' 1"  (1.854 m)  Wt 219 lb (99.338 kg)  BMI 28.89 kg/m2  General: Well-developed, well-nourished in no distress  Head: Normocephalic and atraumatic Eyes:PERRLA/EOMI intact, conjunctiva and lids normal Ears: No deformity or lesions Mouth:normal dentition, normal posterior pharynx Neck: Supple, no JVD.  No masses, thyromegaly or abnormal cervical nodes Lungs: Normal breath sounds bilaterally without wheezing.  Normal percussion Cardiac: Irregular rate and rhythm  with normal S1 and S2, no S3 or S4.  PMI is normal.  No pathological murmurs Abdomen: Normal bowel sounds, abdomen is soft and nontender without masses, organomegaly or hernias noted.  No hepatosplenomegaly MSK: Back normal, normal gait muscle strength and tone normal Vascular: Pulse is normal in all 4 extremities Extremities: No peripheral pitting edema Neurologic: Alert and oriented x 3 Skin: Intact without lesions or rashes Lymphatics: No significant adenopathy Psychologic: Normal  affect   ECG: Atrial fibrillation with rate control. Significantly inverted T waves anterior precordial leads chronic finding  ASSESSMENT AND PLAN

## 2012-05-09 ENCOUNTER — Telehealth: Payer: Self-pay | Admitting: Cardiology

## 2012-05-12 ENCOUNTER — Ambulatory Visit: Payer: Medicare Other | Admitting: Cardiology

## 2012-05-19 ENCOUNTER — Ambulatory Visit: Payer: Medicare Other | Admitting: Cardiology

## 2012-06-06 ENCOUNTER — Encounter: Payer: Self-pay | Admitting: Cardiology

## 2012-06-06 ENCOUNTER — Ambulatory Visit (INDEPENDENT_AMBULATORY_CARE_PROVIDER_SITE_OTHER): Payer: Medicare Other | Admitting: Cardiology

## 2012-06-06 VITALS — BP 118/75 | HR 92 | Ht 73.0 in | Wt 226.0 lb

## 2012-06-06 DIAGNOSIS — R9431 Abnormal electrocardiogram [ECG] [EKG]: Secondary | ICD-10-CM

## 2012-06-06 DIAGNOSIS — I2510 Atherosclerotic heart disease of native coronary artery without angina pectoris: Secondary | ICD-10-CM

## 2012-06-06 DIAGNOSIS — Z01810 Encounter for preprocedural cardiovascular examination: Secondary | ICD-10-CM

## 2012-06-06 DIAGNOSIS — I4891 Unspecified atrial fibrillation: Secondary | ICD-10-CM

## 2012-06-06 NOTE — Patient Instructions (Addendum)
Continue all current medications. Patient is cleared for surgery per Dr. Andee Lineman. Your physician wants you to follow up in:  1 year.  You will receive a reminder letter in the mail one-two months in advance.  If you don't receive a letter, please call our office to schedule the follow up appointment

## 2012-06-22 DIAGNOSIS — Z01810 Encounter for preprocedural cardiovascular examination: Secondary | ICD-10-CM | POA: Insufficient documentation

## 2012-06-22 NOTE — Assessment & Plan Note (Signed)
Coronary artery disease excluded in 2006.  Patient has no recurrent chest pain.  No indication for further stress testing.

## 2012-06-22 NOTE — Assessment & Plan Note (Signed)
Permanent atrial fibrillation.  Patient apprised 48 hour Holter monitor which showed rate controlled atrial fibrillation.  Your previous office visit we discussed the risk of stroke and risk for heart failure.  Although hi CHADS2 score equals 2 the patient declines any Coumadin.  I also think his risk is acceptably low at this point in time.  Revision of this recommendation and always be decided on in the future.

## 2012-06-22 NOTE — Assessment & Plan Note (Signed)
Patient can proceed with laminectomy by Dr. Ophelia Charter.  The patient at low risk for cardiovascular complications and no further ischemia testing is necessary at this point

## 2012-06-22 NOTE — Assessment & Plan Note (Signed)
Old significant T wave inversions in the precordial leads.  Cardiac catheterization 2006 showed nonobstructive disease.  Echocardiogram also showed no structural heart disease with normal LV function.

## 2012-06-22 NOTE — Progress Notes (Signed)
Peyton Bottoms, MD, [facility] ABIM Board Certified in Adult Cardiovascular Medicine,Internal Medicine and Critical Care Medicine    CC: preoperative evaluation prior to laminectomy.                                                                                 HPI:       The patient 77 year old male with a history of chronic afibrillation not on Coumadin therapy.  The patient has declined this in the past.  He did have a very abnormal EKG with deep T-wave inversions but this was evaluated with a normal catheterization and normal echocardiogram.  She has no peripheral heart disease.  He does have some mild shortness of breath on exertion but this is chronic and he also has no chest pain.  He is scheduled for laminectomy in the next couple of weeks.  We've been asked for cardiovascular clearance.  PMH: reviewed and listed in Problem List in Electronic Records (and see below) Past Medical History  Diagnosis Date  . CAD (coronary artery disease)   . HLD (hyperlipidemia)   . HTN (hypertension)   . Atrial fibrillation      new onset diagnosed her clinic visits 11/07/09   Past Surgical History  Procedure Date  . Cardiac catheterization 2006    no obstructive CAD; nml LV function     Allergies/SH/FHX : available in Electronic Records for review  No Known Allergies History   Social History  . Marital Status: Married    Spouse Name: N/A    Number of Children: N/A  . Years of Education: N/A   Occupational History  . Not on file.   Social History Main Topics  . Smoking status: Former Smoker -- 1.0 packs/day for 30 years    Types: Cigarettes    Quit date: 10/15/1982  . Smokeless tobacco: Never Used  . Alcohol Use: Not on file  . Drug Use: Not on file  . Sexually Active: Not on file   Other Topics Concern  . Not on file   Social History Narrative  . No narrative on file   Family History  Problem Relation Age of Onset  . Diabetes Neg Hx   . Hypertension Neg Hx   . Coronary  artery disease Neg Hx     Medications: Current Outpatient Prescriptions  Medication Sig Dispense Refill  . allopurinol (ZYLOPRIM) 300 MG tablet Take 300 mg by mouth daily.        Marland Kitchen amLODipine (NORVASC) 5 MG tablet Take 1 tablet by mouth Daily.      Marland Kitchen aspirin 81 MG EC tablet Take 81 mg by mouth daily.        Marland Kitchen atorvastatin (LIPITOR) 20 MG tablet Take 20 mg by mouth daily.        Marland Kitchen doxazosin (CARDURA) 8 MG tablet Take 1 tablet by mouth Daily.      . furosemide (LASIX) 20 MG tablet Take 20 mg by mouth daily.        Marland Kitchen gemfibrozil (LOPID) 600 MG tablet Take 600 mg by mouth 2 (two) times daily.        . metoprolol (TOPROL-XL) 50 MG 24 hr tablet Take one  tab (50mg ) every morning & 1/2 tab (25mg ) every evening  135 tablet  3  . naproxen (NAPROSYN) 500 MG tablet Take 500 mg by mouth 2 (two) times daily as needed.        . NON FORMULARY bethamethasone - cream dry skin         ROS: No nausea or vomiting. No fever or chills.No melena or hematochezia.No bleeding.No claudication  Physical Exam: BP 118/75  Pulse 92  Ht 6\' 1"  (1.854 m)  Wt 226 lb (102.513 kg)  BMI 29.82 kg/m2  SpO2 98% General:well-nourished white male in no distress Neck:normal upstroke and no carotid bruits. Lungs:clear breath sounds bilaterally without any wheezing. Cardiac:regular rate and rhythm with normal S1-S2 no murmurs or gallops Vascular:no edema.  Normal distal pulses Skin:skin warm and dry Physcologic:normal affect  12lead ZOX:WRUEAV fibrillation with deeply inverted T waves particularly in the inferolateral leads no change compared to old tracing Limited bedside ECHO:N/A No images are attached to the encounter.   I reviewed and summarized the old records. I reviewed ECG and prior blood work.  Assessment and Plan  Abnormal EKG Old significant T wave inversions in the precordial leads.  Cardiac catheterization 2006 showed nonobstructive disease.  Echocardiogram also showed no structural heart disease with  normal LV function.  CAD Coronary artery disease excluded in 2006.  Patient has no recurrent chest pain.  No indication for further stress testing.  FIBRILLATION, ATRIAL Permanent atrial fibrillation.  Patient apprised 48 hour Holter monitor which showed rate controlled atrial fibrillation.  Your previous office visit we discussed the risk of stroke and risk for heart failure.  Although hi CHADS2 score equals 2 the patient declines any Coumadin.  I also think his risk is acceptably low at this point in time.  Revision of this recommendation and always be decided on in the future.  Preoperative cardiovascular examination Patient can proceed with laminectomy by Dr. Ophelia Charter.  The patient at low risk for cardiovascular complications and no further ischemia testing is necessary at this point    Patient Active Problem List  Diagnosis  . HYPERLIPIDEMIA  . HYPERTENSION  . CAD  . FIBRILLATION, ATRIAL  . Abnormal EKG  . Preoperative cardiovascular examination

## 2012-06-26 ENCOUNTER — Other Ambulatory Visit (HOSPITAL_COMMUNITY): Payer: Self-pay | Admitting: Orthopaedic Surgery

## 2012-07-11 ENCOUNTER — Encounter (HOSPITAL_COMMUNITY): Payer: Self-pay | Admitting: Pharmacy Technician

## 2012-07-15 ENCOUNTER — Encounter (HOSPITAL_COMMUNITY)
Admission: RE | Admit: 2012-07-15 | Discharge: 2012-07-15 | Disposition: A | Discharge status: Home / Self Care | Payer: Medicare Other | Source: Ambulatory Visit | Attending: Orthopaedic Surgery | Admitting: Orthopaedic Surgery

## 2012-07-15 ENCOUNTER — Encounter (HOSPITAL_COMMUNITY): Payer: Self-pay

## 2012-07-15 HISTORY — DX: Unspecified osteoarthritis, unspecified site: M19.90

## 2012-07-15 LAB — COMPREHENSIVE METABOLIC PANEL
ALT: 24 U/L (ref 0–53)
AST: 30 U/L (ref 0–37)
Albumin: 4 g/dL (ref 3.5–5.2)
CO2: 27 mEq/L (ref 19–32)
Calcium: 10.4 mg/dL (ref 8.4–10.5)
Chloride: 101 mEq/L (ref 96–112)
Creatinine, Ser: 0.98 mg/dL (ref 0.50–1.35)
Sodium: 138 mEq/L (ref 135–145)
Total Bilirubin: 0.7 mg/dL (ref 0.3–1.2)

## 2012-07-15 LAB — CBC
MCV: 103.7 fL — ABNORMAL HIGH (ref 78.0–100.0)
Platelets: 140 10*3/uL — ABNORMAL LOW (ref 150–400)
RBC: 4.65 MIL/uL (ref 4.22–5.81)
RDW: 13 % (ref 11.5–15.5)
WBC: 5.4 10*3/uL (ref 4.0–10.5)

## 2012-07-15 LAB — SURGICAL PCR SCREEN
MRSA, PCR: NEGATIVE
Staphylococcus aureus: NEGATIVE

## 2012-07-15 LAB — PROTIME-INR: INR: 1.05 (ref 0.00–1.49)

## 2012-07-15 NOTE — Pre-Procedure Instructions (Signed)
22 Craig [name]  07/15/2012   Your procedure is scheduled on:  07-21-2012  Report to [facility] at 8:30 AM.  Call this number if you have problems the morning of surgery: [phone]   Remember:   Do not eat food or drink:After Midnight.   .  Take these medicines the morning of surgery with A SIP OF WATER: allopurinol,amlodipine,doxazosin(Cardura),Metoprolol(Toprol XL)   Do not wear jewelry  Do not wear lotions, powders, or perfumes. You may wear deodorant.  Do not shave 48 hours prior to surgery. Men may shave face and neck.  Do not bring valuables to the hospital.  Contacts, dentures or bridgework may not be worn into surgery.  Leave suitcase in the car. After surgery it may be brought to your room.   For patients admitted to the hospital, checkout time is 11:00 AM the day of discharge.     Special Instructions: Incentive Spirometry - Practice and bring it with you on the day of surgery. Shower using CHG 2 nights before surgery and the night before surgery.  If you shower the day of surgery use CHG.  Use special wash - you have one bottle of CHG for all showers.  You should use approximately 1/3 of the bottle for each shower.   Please read over the following fact sheets that you were given: Pain Booklet, Coughing and Deep Breathing, MRSA Information and Surgical Site Infection Prevention

## 2012-07-18 NOTE — Progress Notes (Signed)
Pt notified of time change of procedure & arrival-Arrival:10:30AM, Procedure:12:28PM.  Pt verbalized understanding.

## 2012-07-20 MED ORDER — CEFAZOLIN SODIUM-DEXTROSE 2-3 GM-% IV SOLR
2.0000 g | INTRAVENOUS | Status: AC
  Administered 2012-07-21: 2 g via INTRAVENOUS
  Filled 2012-07-20: qty 50

## 2012-07-21 ENCOUNTER — Encounter (HOSPITAL_COMMUNITY)
Admission: RE | Disposition: A | Discharge status: Home / Self Care | Payer: Self-pay | Source: Ambulatory Visit | Attending: Orthopaedic Surgery

## 2012-07-21 ENCOUNTER — Encounter (HOSPITAL_COMMUNITY): Payer: Self-pay | Admitting: Anesthesiology

## 2012-07-21 ENCOUNTER — Inpatient Hospital Stay (HOSPITAL_COMMUNITY): Payer: Medicare Other

## 2012-07-21 ENCOUNTER — Inpatient Hospital Stay (HOSPITAL_COMMUNITY): Payer: Medicare Other | Admitting: Anesthesiology

## 2012-07-21 ENCOUNTER — Encounter (HOSPITAL_COMMUNITY): Payer: Self-pay | Admitting: *Deleted

## 2012-07-21 ENCOUNTER — Ambulatory Visit (HOSPITAL_COMMUNITY)
Admission: RE | Admit: 2012-07-21 | Discharge: 2012-07-22 | Disposition: A | Discharge status: Home / Self Care | Payer: Medicare Other | Source: Ambulatory Visit | Attending: Orthopaedic Surgery | Admitting: Orthopaedic Surgery

## 2012-07-21 DIAGNOSIS — Z01812 Encounter for preprocedural laboratory examination: Secondary | ICD-10-CM | POA: Insufficient documentation

## 2012-07-21 DIAGNOSIS — Z01818 Encounter for other preprocedural examination: Secondary | ICD-10-CM | POA: Insufficient documentation

## 2012-07-21 DIAGNOSIS — M5126 Other intervertebral disc displacement, lumbar region: Principal | ICD-10-CM | POA: Diagnosis present

## 2012-07-21 DIAGNOSIS — I10 Essential (primary) hypertension: Secondary | ICD-10-CM | POA: Insufficient documentation

## 2012-07-21 HISTORY — PX: MICRODISCECTOMY LUMBAR: SUR864

## 2012-07-21 HISTORY — PX: LUMBAR LAMINECTOMY: SHX95

## 2012-07-21 SURGERY — MICRODISCECTOMY LUMBAR LAMINECTOMY
Anesthesia: General | Site: Back | Laterality: Left | Wound class: Clean

## 2012-07-21 MED ORDER — ONDANSETRON HCL 4 MG/2ML IJ SOLN
INTRAMUSCULAR | Status: DC | PRN
  Administered 2012-07-21: 4 mg via INTRAVENOUS

## 2012-07-21 MED ORDER — ACETAMINOPHEN 10 MG/ML IV SOLN
INTRAVENOUS | Status: DC | PRN
  Administered 2012-07-21: 1000 mg via INTRAVENOUS

## 2012-07-21 MED ORDER — ASPIRIN 81 MG PO TBEC: 81.0000 mg | DELAYED_RELEASE_TABLET | Freq: Every day | ORAL | Status: DC

## 2012-07-21 MED ORDER — SODIUM CHLORIDE 0.9 % IV SOLN
10.0000 mg | INTRAVENOUS | Status: DC | PRN
  Administered 2012-07-21: 20 ug/min via INTRAVENOUS

## 2012-07-21 MED ORDER — LACTATED RINGERS IV SOLN
INTRAVENOUS | Status: DC
  Administered 2012-07-21: 12:00:00 via INTRAVENOUS

## 2012-07-21 MED ORDER — ACETAMINOPHEN 325 MG PO TABS: 650.0000 mg | ORAL_TABLET | ORAL | Status: DC | PRN

## 2012-07-21 MED ORDER — ACETAMINOPHEN 10 MG/ML IV SOLN
INTRAVENOUS | Status: AC
  Filled 2012-07-21: qty 100

## 2012-07-21 MED ORDER — OXYCODONE HCL 5 MG/5ML PO SOLN: 5.0000 mg | Freq: Once | ORAL | Status: DC | PRN

## 2012-07-21 MED ORDER — METOPROLOL SUCCINATE ER 50 MG PO TB24
50.0000 mg | ORAL_TABLET | Freq: Every day | ORAL | Status: DC
  Filled 2012-07-21: qty 1

## 2012-07-21 MED ORDER — SODIUM CHLORIDE 0.9 % IV SOLN: 250.0000 mL | INTRAVENOUS | Status: DC

## 2012-07-21 MED ORDER — PANTOPRAZOLE SODIUM 40 MG PO TBEC
40.0000 mg | DELAYED_RELEASE_TABLET | Freq: Every day | ORAL | Status: DC
  Administered 2012-07-21: 40 mg via ORAL
  Filled 2012-07-21: qty 1

## 2012-07-21 MED ORDER — LIDOCAINE HCL (CARDIAC) 20 MG/ML IV SOLN
INTRAVENOUS | Status: DC | PRN
  Administered 2012-07-21: 100 mg via INTRAVENOUS

## 2012-07-21 MED ORDER — BUPIVACAINE HCL (PF) 0.25 % IJ SOLN
INTRAMUSCULAR | Status: DC | PRN
  Administered 2012-07-21: 8 mL

## 2012-07-21 MED ORDER — ACETAMINOPHEN 650 MG RE SUPP: 650.0000 mg | RECTAL | Status: DC | PRN

## 2012-07-21 MED ORDER — CEFAZOLIN SODIUM 1-5 GM-% IV SOLN
1.0000 g | Freq: Three times a day (TID) | INTRAVENOUS | Status: AC
  Administered 2012-07-21 – 2012-07-22 (×2): 1 g via INTRAVENOUS
  Filled 2012-07-21 (×2): qty 50

## 2012-07-21 MED ORDER — HYDROCODONE-ACETAMINOPHEN 5-325 MG PO TABS
1.0000 | ORAL_TABLET | ORAL | Status: DC | PRN
  Administered 2012-07-21 (×2): 2 via ORAL
  Filled 2012-07-21 (×2): qty 2

## 2012-07-21 MED ORDER — KCL IN DEXTROSE-NACL 20-5-0.45 MEQ/L-%-% IV SOLN
INTRAVENOUS | Status: DC
  Administered 2012-07-21: 23:00:00 via INTRAVENOUS
  Filled 2012-07-21 (×2): qty 1000

## 2012-07-21 MED ORDER — ASPIRIN EC 81 MG PO TBEC
81.0000 mg | DELAYED_RELEASE_TABLET | Freq: Every day | ORAL | Status: DC
  Administered 2012-07-21: 81 mg via ORAL
  Filled 2012-07-21 (×2): qty 1

## 2012-07-21 MED ORDER — ONDANSETRON HCL 4 MG/2ML IJ SOLN: 4.0000 mg | INTRAMUSCULAR | Status: DC | PRN

## 2012-07-21 MED ORDER — ONDANSETRON HCL 4 MG/2ML IJ SOLN: 4.0000 mg | Freq: Once | INTRAMUSCULAR | Status: DC | PRN

## 2012-07-21 MED ORDER — METHOCARBAMOL 500 MG PO TABS: 500.0000 mg | ORAL_TABLET | Freq: Four times a day (QID) | ORAL | Status: DC | PRN

## 2012-07-21 MED ORDER — METOPROLOL SUCCINATE ER 25 MG PO TB24
25.0000 mg | ORAL_TABLET | Freq: Every day | ORAL | Status: DC
  Administered 2012-07-21: 25 mg via ORAL
  Filled 2012-07-21 (×2): qty 1

## 2012-07-21 MED ORDER — OXYCODONE HCL 5 MG PO TABS: 5.0000 mg | ORAL_TABLET | Freq: Once | ORAL | Status: DC | PRN

## 2012-07-21 MED ORDER — HYDROMORPHONE HCL PF 1 MG/ML IJ SOLN
INTRAMUSCULAR | Status: AC
  Filled 2012-07-21: qty 1

## 2012-07-21 MED ORDER — LACTATED RINGERS IV SOLN
INTRAVENOUS | Status: DC | PRN
  Administered 2012-07-21 (×2): via INTRAVENOUS

## 2012-07-21 MED ORDER — BUPIVACAINE HCL (PF) 0.25 % IJ SOLN
INTRAMUSCULAR | Status: AC
  Filled 2012-07-21: qty 30

## 2012-07-21 MED ORDER — DOCUSATE SODIUM 100 MG PO CAPS
100.0000 mg | ORAL_CAPSULE | Freq: Two times a day (BID) | ORAL | Status: DC
  Administered 2012-07-21: 100 mg via ORAL
  Filled 2012-07-21 (×3): qty 1

## 2012-07-21 MED ORDER — SODIUM CHLORIDE 0.9 % IJ SOLN: 3.0000 mL | INTRAMUSCULAR | Status: DC | PRN

## 2012-07-21 MED ORDER — DOXAZOSIN MESYLATE 8 MG PO TABS
8.0000 mg | ORAL_TABLET | Freq: Every day | ORAL | Status: DC
  Administered 2012-07-21: 8 mg via ORAL
  Filled 2012-07-21 (×2): qty 1

## 2012-07-21 MED ORDER — SODIUM CHLORIDE 0.9 % IJ SOLN
3.0000 mL | Freq: Two times a day (BID) | INTRAMUSCULAR | Status: DC
  Administered 2012-07-21: 23:00:00 via INTRAVENOUS

## 2012-07-21 MED ORDER — FENTANYL CITRATE 0.05 MG/ML IJ SOLN
INTRAMUSCULAR | Status: DC | PRN
  Administered 2012-07-21: 150 ug via INTRAVENOUS
  Administered 2012-07-21 (×2): 50 ug via INTRAVENOUS

## 2012-07-21 MED ORDER — PROPOFOL 10 MG/ML IV BOLUS
INTRAVENOUS | Status: DC | PRN
  Administered 2012-07-21: 40 mg via INTRAVENOUS
  Administered 2012-07-21: 100 mg via INTRAVENOUS

## 2012-07-21 MED ORDER — HYDROMORPHONE HCL PF 1 MG/ML IJ SOLN
0.2500 mg | INTRAMUSCULAR | Status: DC | PRN
  Administered 2012-07-21: 0.5 mg via INTRAVENOUS

## 2012-07-21 MED ORDER — FUROSEMIDE 20 MG PO TABS
20.0000 mg | ORAL_TABLET | Freq: Every day | ORAL | Status: DC
  Filled 2012-07-21 (×2): qty 1

## 2012-07-21 MED ORDER — OXYCODONE-ACETAMINOPHEN 5-325 MG PO TABS: 1.0000 | ORAL_TABLET | ORAL | Status: DC | PRN

## 2012-07-21 MED ORDER — MORPHINE SULFATE 2 MG/ML IJ SOLN: 1.0000 mg | INTRAMUSCULAR | Status: DC | PRN

## 2012-07-21 MED ORDER — ROCURONIUM BROMIDE 100 MG/10ML IV SOLN
INTRAVENOUS | Status: DC | PRN
  Administered 2012-07-21: 50 mg via INTRAVENOUS

## 2012-07-21 MED ORDER — NEOSTIGMINE METHYLSULFATE 1 MG/ML IJ SOLN
INTRAMUSCULAR | Status: DC | PRN
  Administered 2012-07-21: 5 mg via INTRAVENOUS

## 2012-07-21 MED ORDER — SENNOSIDES-DOCUSATE SODIUM 8.6-50 MG PO TABS: 1.0000 | ORAL_TABLET | Freq: Every evening | ORAL | Status: DC | PRN

## 2012-07-21 MED ORDER — ALLOPURINOL 300 MG PO TABS
300.0000 mg | ORAL_TABLET | Freq: Every day | ORAL | Status: DC
  Administered 2012-07-21: 300 mg via ORAL
  Filled 2012-07-21 (×2): qty 1

## 2012-07-21 MED ORDER — ZOLPIDEM TARTRATE 5 MG PO TABS: 5.0000 mg | ORAL_TABLET | Freq: Every evening | ORAL | Status: DC | PRN

## 2012-07-21 MED ORDER — PANTOPRAZOLE SODIUM 40 MG IV SOLR
40.0000 mg | Freq: Every day | INTRAVENOUS | Status: DC
  Filled 2012-07-21: qty 40

## 2012-07-21 MED ORDER — BISACODYL 10 MG RE SUPP: 10.0000 mg | Freq: Every day | RECTAL | Status: DC | PRN

## 2012-07-21 MED ORDER — PHENYLEPHRINE HCL 10 MG/ML IJ SOLN
INTRAMUSCULAR | Status: DC | PRN
  Administered 2012-07-21: 40 ug via INTRAVENOUS

## 2012-07-21 MED ORDER — METHOCARBAMOL 100 MG/ML IJ SOLN
500.0000 mg | Freq: Four times a day (QID) | INTRAVENOUS | Status: DC | PRN
  Filled 2012-07-21: qty 5

## 2012-07-21 MED ORDER — MENTHOL 3 MG MT LOZG: 1.0000 | LOZENGE | OROMUCOSAL | Status: DC | PRN

## 2012-07-21 MED ORDER — FLEET ENEMA 7-19 GM/118ML RE ENEM: 1.0000 | ENEMA | Freq: Once | RECTAL | Status: AC | PRN

## 2012-07-21 MED ORDER — METOPROLOL SUCCINATE ER 25 MG PO TB24: 25.0000 mg | ORAL_TABLET | Freq: Every day | ORAL | Status: DC

## 2012-07-21 MED ORDER — GEMFIBROZIL 600 MG PO TABS
600.0000 mg | ORAL_TABLET | Freq: Two times a day (BID) | ORAL | Status: DC
  Administered 2012-07-21: 600 mg via ORAL
  Filled 2012-07-21 (×3): qty 1

## 2012-07-21 MED ORDER — ATORVASTATIN CALCIUM 20 MG PO TABS
20.0000 mg | ORAL_TABLET | Freq: Every day | ORAL | Status: DC
  Administered 2012-07-21: 20 mg via ORAL
  Filled 2012-07-21 (×2): qty 1

## 2012-07-21 MED ORDER — MEPERIDINE HCL 25 MG/ML IJ SOLN: 6.2500 mg | INTRAMUSCULAR | Status: DC | PRN

## 2012-07-21 MED ORDER — GLYCOPYRROLATE 0.2 MG/ML IJ SOLN
INTRAMUSCULAR | Status: DC | PRN
  Administered 2012-07-21: .6 mg via INTRAVENOUS

## 2012-07-21 MED ORDER — PHENOL 1.4 % MT LIQD: 1.0000 | OROMUCOSAL | Status: DC | PRN

## 2012-07-21 MED ORDER — AMLODIPINE BESYLATE 5 MG PO TABS
5.0000 mg | ORAL_TABLET | Freq: Every day | ORAL | Status: DC
  Filled 2012-07-21: qty 1

## 2012-07-21 MED ORDER — KETOROLAC TROMETHAMINE 30 MG/ML IJ SOLN
30.0000 mg | Freq: Four times a day (QID) | INTRAMUSCULAR | Status: DC
  Administered 2012-07-21 – 2012-07-22 (×2): 30 mg via INTRAVENOUS
  Filled 2012-07-21 (×3): qty 1

## 2012-07-21 SURGICAL SUPPLY — 55 items
ADH SKN CLS APL DERMABOND .7 (GAUZE/BANDAGES/DRESSINGS) ×1
APL SKNCLS STERI-STRIP NONHPOA (GAUZE/BANDAGES/DRESSINGS)
BENZOIN TINCTURE PRP APPL 2/3 (GAUZE/BANDAGES/DRESSINGS) ×1 IMPLANT
BUR ROUND FLUTED 4 SOFT TCH (BURR) ×1 IMPLANT
CLOTH BEACON ORANGE TIMEOUT ST (SAFETY) ×2 IMPLANT
CORDS BIPOLAR (ELECTRODE) ×2 IMPLANT
COVER SURGICAL LIGHT HANDLE (MISCELLANEOUS) ×2 IMPLANT
DERMABOND ADVANCED (GAUZE/BANDAGES/DRESSINGS) ×1
DERMABOND ADVANCED .7 DNX12 (GAUZE/BANDAGES/DRESSINGS) IMPLANT
DRAPE MICROSCOPE LEICA (MISCELLANEOUS) ×2 IMPLANT
DRAPE PROXIMA HALF (DRAPES) ×4 IMPLANT
DRSG EMULSION OIL 3X3 NADH (GAUZE/BANDAGES/DRESSINGS) ×1 IMPLANT
DRSG MEPILEX BORDER 4X8 (GAUZE/BANDAGES/DRESSINGS) ×1 IMPLANT
DURAPREP 26ML APPLICATOR (WOUND CARE) ×2 IMPLANT
ELECT REM PT RETURN 9FT ADLT (ELECTROSURGICAL) ×2
ELECTRODE REM PT RTRN 9FT ADLT (ELECTROSURGICAL) ×1 IMPLANT
GLOVE BIOGEL PI IND STRL 7.5 (GLOVE) ×1 IMPLANT
GLOVE BIOGEL PI IND STRL 8 (GLOVE) ×1 IMPLANT
GLOVE BIOGEL PI INDICATOR 7.5 (GLOVE) ×1
GLOVE BIOGEL PI INDICATOR 8 (GLOVE) ×2
GLOVE BIOGEL PI ORTHO PRO SZ7 (GLOVE) ×1
GLOVE ECLIPSE 7.0 STRL STRAW (GLOVE) ×3 IMPLANT
GLOVE ORTHO TXT STRL SZ7.5 (GLOVE) ×2 IMPLANT
GLOVE PI ORTHO PRO STRL SZ7 (GLOVE) IMPLANT
GLOVE SURG SS PI 7.0 STRL IVOR (GLOVE) ×1 IMPLANT
GLOVE SURG SS PI 8.0 STRL IVOR (GLOVE) ×2 IMPLANT
GOWN PREVENTION PLUS LG XLONG (DISPOSABLE) IMPLANT
GOWN STRL NON-REIN LRG LVL3 (GOWN DISPOSABLE) ×5 IMPLANT
GOWN STRL REIN XL XLG (GOWN DISPOSABLE) ×2 IMPLANT
KIT BASIN OR (CUSTOM PROCEDURE TRAY) ×2 IMPLANT
KIT ROOM TURNOVER OR (KITS) ×2 IMPLANT
MANIFOLD NEPTUNE II (INSTRUMENTS) ×2 IMPLANT
NDL HYPO 25GX1X1/2 BEV (NEEDLE) ×1 IMPLANT
NDL SPNL 18GX3.5 QUINCKE PK (NEEDLE) ×1 IMPLANT
NDL SUT .5 MAYO 1.404X.05X (NEEDLE) ×1 IMPLANT
NEEDLE HYPO 25GX1X1/2 BEV (NEEDLE) ×2 IMPLANT
NEEDLE MAYO TAPER (NEEDLE) ×2
NEEDLE SPNL 18GX3.5 QUINCKE PK (NEEDLE) ×2 IMPLANT
NS IRRIG 1000ML POUR BTL (IV SOLUTION) ×2 IMPLANT
PACK LAMINECTOMY ORTHO (CUSTOM PROCEDURE TRAY) ×2 IMPLANT
PAD ARMBOARD 7.5X6 YLW CONV (MISCELLANEOUS) ×4 IMPLANT
PATTIES SURGICAL .5 X.5 (GAUZE/BANDAGES/DRESSINGS) ×2 IMPLANT
PATTIES SURGICAL .75X.75 (GAUZE/BANDAGES/DRESSINGS) IMPLANT
SPONGE GAUZE 4X4 12PLY (GAUZE/BANDAGES/DRESSINGS) ×1 IMPLANT
STRIP CLOSURE SKIN 1/2X4 (GAUZE/BANDAGES/DRESSINGS) IMPLANT
SUT VIC AB 2-0 CT1 27 (SUTURE)
SUT VIC AB 2-0 CT1 TAPERPNT 27 (SUTURE) ×1 IMPLANT
SUT VICRYL 0 TIES 12 18 (SUTURE) ×2 IMPLANT
SUT VICRYL 4-0 PS2 18IN ABS (SUTURE) ×1 IMPLANT
SUT VICRYL AB 2 0 TIES (SUTURE) ×2 IMPLANT
SYR 20ML ECCENTRIC (SYRINGE) IMPLANT
SYR CONTROL 10ML LL (SYRINGE) ×2 IMPLANT
TOWEL OR 17X24 6PK STRL BLUE (TOWEL DISPOSABLE) ×2 IMPLANT
TOWEL OR 17X26 10 PK STRL BLUE (TOWEL DISPOSABLE) ×2 IMPLANT
WATER STERILE IRR 1000ML POUR (IV SOLUTION) ×1 IMPLANT

## 2012-07-21 NOTE — Preoperative (Signed)
Beta Blockers   Reason not to administer Beta Blockers:Not Applicable 

## 2012-07-21 NOTE — Anesthesia Preprocedure Evaluation (Addendum)
Anesthesia Evaluation  Patient identified by MRN, date of birth, ID band Patient awake    Reviewed: Allergy & Precautions, H&P , NPO status , Patient's Chart, lab work & pertinent test results, reviewed documented beta blocker date and time   Airway Mallampati: II TM Distance: <3 FB Neck ROM: Full    Dental  (+) Edentulous Upper, Teeth Intact and Dental Advisory Given   Pulmonary former smoker,  breath sounds clear to auscultation  Pulmonary exam normal       Cardiovascular hypertension, Pt. on medications and Pt. on home beta blockers + CAD + dysrhythmias Atrial Fibrillation Rhythm:Irregular Rate:Normal     Neuro/Psych    GI/Hepatic   Endo/Other    Renal/GU      Musculoskeletal   Abdominal Normal abdominal exam  (+)   Peds  Hematology   Anesthesia Other Findings   Reproductive/Obstetrics                          Anesthesia Physical Anesthesia Plan  ASA: II  Anesthesia Plan: General   Post-op Pain Management:    Induction: Intravenous  Airway Management Planned: Oral ETT  Additional Equipment:   Intra-op Plan:   Post-operative Plan: Extubation in OR  Informed Consent: I have reviewed the patients History and Physical, chart, labs and discussed the procedure including the risks, benefits and alternatives for the proposed anesthesia with the patient or authorized representative who has indicated his/her understanding and acceptance.   Dental advisory given  Plan Discussed with: CRNA and Surgeon  Anesthesia Plan Comments:       Anesthesia Quick Evaluation

## 2012-07-21 NOTE — H&P (Signed)
Craig [name] is an 77 y.o. male.   Chief Complaint: back and left leg pain HPI: Greater than one year of back pain and related left leg pain, numbness and tingling which is worse with ambulation and standing.  Some relief with lying and flexion. Radiographic studies indicate 2 mm spondylolisthesis, broad base disc bulge with left lateral recess and neuroforaminal nerve root compression at the L5-S1 level, more severe on the left.  Pt has been treated conservatively with epidural steroid injections, analgesics and activity modification which does not give him relief to his symptoms.  Surgical intervention discussed with the pt by Dr Ophelia Charter and pt wishes to proceed with Left L5-S1 foraminotomy and microdiscectomy.  Pt seen by his cardiologist and felt stable for the procedure.  Past Medical History  Diagnosis Date  . HLD (hyperlipidemia)   . HTN (hypertension)   . CAD (coronary artery disease)   . Atrial fibrillation      new onset diagnosed her clinic visits 11/07/09  . Arthritis     Past Surgical History  Procedure Date  . Joint replacement     hip left  . Cardiac catheterization 2006    no obstructive CAD; nml LV function     Family History  Problem Relation Age of Onset  . Diabetes Neg Hx   . Hypertension Neg Hx   . Coronary artery disease Neg Hx    Social History:  reports that he quit smoking about 29 years ago. His smoking use included Cigarettes. He has a 30 pack-year smoking history. He has never used smokeless tobacco. He reports that he drinks about 8.4 ounces of alcohol per week. He reports that he does not use illicit drugs.  Allergies: No Known Allergies  Medications Prior to Admission  Medication Sig Dispense Refill  . allopurinol (ZYLOPRIM) 300 MG tablet Take 300 mg by mouth daily.       Marland Kitchen amLODipine (NORVASC) 5 MG tablet Take 5 mg by mouth daily.       Marland Kitchen aspirin 81 MG EC tablet Take 81 mg by mouth daily.       Marland Kitchen atorvastatin (LIPITOR) 20 MG tablet Take 20 mg by  mouth daily.       . betamethasone valerate (VALISONE) 0.1 % cream Apply 1 application topically 2 (two) times daily as needed. For dry skin      . doxazosin (CARDURA) 8 MG tablet Take 8 mg by mouth at bedtime.       . furosemide (LASIX) 20 MG tablet Take 20 mg by mouth daily.       Marland Kitchen gemfibrozil (LOPID) 600 MG tablet Take 600 mg by mouth 2 (two) times daily.       . metoprolol succinate (TOPROL-XL) 50 MG 24 hr tablet Take 25-50 mg by mouth daily. Take one tab (50mg ) every morning & 1/2 tab (25mg ) every evening      . naproxen (NAPROSYN) 500 MG tablet Take 500 mg by mouth 2 (two) times daily as needed. For pain        No results found for this or any previous visit (from the past 48 hour(s)). No results found.  Review of Systems  Constitutional: Negative.   HENT: Negative.   Eyes: Negative.   Respiratory: Negative.   Cardiovascular: Negative.   Gastrointestinal: Negative.   Genitourinary:       No bowel or bladder dysfuction  Musculoskeletal:       Left leg pain with ambulation and standing.    Skin:  Negative.   Neurological: Positive for tingling and focal weakness.       Left leg intermittently   Endo/Heme/Allergies: Negative.   Psychiatric/Behavioral: Negative.     Blood pressure 145/71, pulse 84, temperature 97.8 F (36.6 C), temperature source Oral, resp. rate 20, SpO2 96.00%. Physical Exam  Constitutional: He is oriented to person, place, and time. He appears well-developed and well-nourished.  HENT:  Head: Normocephalic and atraumatic.  Eyes: EOM are normal. Pupils are equal, round, and reactive to light.  Neck: Neck supple.  Cardiovascular: Normal rate, regular rhythm, normal heart sounds and intact distal pulses.   Respiratory: Effort normal and breath sounds normal.  GI: Soft. Bowel sounds are normal.  Musculoskeletal:       Negative SLR bilateral.  No focal weakness of LEs on exam.  Nontender over LS spine.  Mild sciatic notch tenderness on the left.    Neurological: He is alert and oriented to person, place, and time.  Skin: Skin is warm and dry.  Psychiatric: He has a normal mood and affect.     Assessment/Plan Left L5-S1 foraminal stenosis  PLAN:  Left L5-S1 microdiscectomy and foraminotomy.  Craig [name] M 07/21/2012, 12:05 PM

## 2012-07-21 NOTE — Transfer of Care (Signed)
Immediate Anesthesia Transfer of Care Note  Patient: Craig [name]  Procedure(s) Performed: Procedure(s) (LRB) with comments: MICRODISCECTOMY LUMBAR LAMINECTOMY (Left) - Left L5-S1 Foraminotomy, Microdiscectomy  Patient Location: PACU  Anesthesia Type: General  Level of Consciousness: awake, alert , oriented and patient cooperative  Airway & Oxygen Therapy: Patient Spontanous Breathing and Patient connected to face mask oxygen  Post-op Assessment: Report given to PACU RN and Post -op Vital signs reviewed and stable  Post vital signs: Reviewed and stable  Complications: No apparent anesthesia complications

## 2012-07-21 NOTE — Anesthesia Procedure Notes (Signed)
Procedure Name: Intubation Date/Time: 07/21/2012 1:10 PM Performed by: Romie Minus K Pre-anesthesia Checklist: Patient identified, Timeout performed, Emergency Drugs available, Suction available and Patient being monitored Patient Re-evaluated:Patient Re-evaluated prior to inductionOxygen Delivery Method: Circle system utilized Preoxygenation: Pre-oxygenation with 100% oxygen Intubation Type: IV induction Ventilation: Mask ventilation without difficulty and Oral airway inserted - appropriate to patient size Laryngoscope Size: Miller and 2 Grade View: Grade I Tube type: Oral Tube size: 7.5 mm Number of attempts: 1 Airway Equipment and Method: Patient positioned with wedge pillow,  Stylet and Oral airway Placement Confirmation: ETT inserted through vocal cords under direct vision,  positive ETCO2 and breath sounds checked- equal and bilateral Secured at: 23 cm Tube secured with: Tape Dental Injury: Teeth and Oropharynx as per pre-operative assessment

## 2012-07-21 NOTE — Interval H&P Note (Signed)
History and Physical Interval Note:  07/21/2012 1:07 PM  Craig [name]  has presented today for surgery, with the diagnosis of Left L5-S1 foraminal stenosis  The various methods of treatment have been discussed with the patient and family. After consideration of risks, benefits and other options for treatment, the patient has consented to  Procedure(s) (LRB) with comments: MICRODISCECTOMY LUMBAR LAMINECTOMY (Left) - Left L5-S1 Foraminotomy, Microdiscectomy as a surgical intervention .  The patient's history has been reviewed, patient examined, no change in status, stable for surgery.  I have reviewed the patient's chart and labs.  Questions were answered to the patient's satisfaction.     Kamalani Mastro C

## 2012-07-21 NOTE — Brief Op Note (Cosign Needed)
07/21/2012  2:46 PM  PATIENT:  Craig [name]  77 y.o. male  PRE-OPERATIVE DIAGNOSIS:  Left L5-S1 foraminal stenosis  POST-OPERATIVE DIAGNOSIS:  Left L5-S1 foraminal stenosis  PROCEDURE:  Procedure(s) (LRB) with comments: MICRODISCECTOMY LUMBAR LAMINECTOMY (Left) - Left L5-S1 Foraminotomy, Microdiscectomy  SURGEON:  Surgeon(s) and Role:    * Eldred Manges, MD - Primary  PHYSICIAN ASSISTANT: [facility]  ASSISTANTS: none   ANESTHESIA:   local and general  EBL:  Total I/O In: 1000 [I.V.:1000] Out: 25 [Blood:25]  BLOOD ADMINISTERED:none  DRAINS: none   LOCAL MEDICATIONS USED:  MARCAINE     SPECIMEN:  No Specimen  DISPOSITION OF SPECIMEN:  N/A  COUNTS:  YES  TOURNIQUET:  * No tourniquets in log *  DICTATION: .Note written in EPIC  PLAN OF CARE: Admit for overnight observation  PATIENT DISPOSITION:  PACU - hemodynamically stable.   Delay start of Pharmacological VTE agent (>24hrs) due to surgical blood loss or risk of bleeding: yes

## 2012-07-22 ENCOUNTER — Encounter (HOSPITAL_COMMUNITY): Payer: Self-pay | Admitting: General Practice

## 2012-07-22 MED ORDER — INFLUENZA VIRUS VACC SPLIT PF IM SUSP
0.5000 mL | Freq: Once | INTRAMUSCULAR | Status: AC
  Administered 2012-07-22: 0.5 mL via INTRAMUSCULAR
  Filled 2012-07-22: qty 0.5

## 2012-07-22 MED ORDER — PNEUMOCOCCAL VAC POLYVALENT 25 MCG/0.5ML IJ INJ
0.5000 mL | INJECTION | INTRAMUSCULAR | Status: AC
  Administered 2012-07-22: 0.5 mL via INTRAMUSCULAR
  Filled 2012-07-22 (×2): qty 0.5

## 2012-07-22 NOTE — Progress Notes (Signed)
Pt. Discharged via wheelchair in stable condition. Discharge instructions and prescriptions were given and explained.

## 2012-07-22 NOTE — Anesthesia Postprocedure Evaluation (Signed)
Anesthesia Post Note  Patient: Craig [name]  Procedure(s) Performed: Procedure(s) (LRB): MICRODISCECTOMY LUMBAR LAMINECTOMY (Left)  Anesthesia type: general  Patient location: PACU  Post pain: Pain level controlled  Post assessment: Patient's Cardiovascular Status Stable  Last Vitals:  Filed Vitals:   07/21/12 2344  BP: 118/69  Pulse: 70  Temp: 36.6 C  Resp: 18    Post vital signs: Reviewed and stable  Level of consciousness: sedated  Complications: No apparent anesthesia complications

## 2012-07-22 NOTE — Discharge Summary (Signed)
Physician Discharge Summary  Patient ID: Craig [name] MRN: [id number] DOB/AGE: July 20, 1935 77 y.o.  Admit date: 07/21/2012 Discharge date: 07/22/2012  Admission Diagnoses:  Discharge Diagnoses:  Principal Problem:  *HNP (herniated nucleus pulposus), lumbar  Left L5-S1  Discharged Condition: [facility] Course: admitted, underwent above procedure with good relief of radicular leg pain. Ambulatory no weakness.   Consults: None  Significant Diagnostic Studies: normal admission labs  Treatments: surgery:  Discharge Exam: Blood pressure 118/69, pulse 70, temperature 97.8 F (36.6 C), temperature source Oral, resp. rate 18, SpO2 98.00%.   Disposition: 06-Home-Health Care Svc     Medication List     As of 07/22/2012  6:56 AM    TAKE these medications         allopurinol 300 MG tablet   Commonly known as: ZYLOPRIM   Take 300 mg by mouth daily.      amLODipine 5 MG tablet   Commonly known as: NORVASC   Take 5 mg by mouth daily.      aspirin 81 MG EC tablet   Take 81 mg by mouth daily.      atorvastatin 20 MG tablet   Commonly known as: LIPITOR   Take 20 mg by mouth daily.      betamethasone valerate 0.1 % cream   Commonly known as: VALISONE   Apply 1 application topically 2 (two) times daily as needed. For dry skin      doxazosin 8 MG tablet   Commonly known as: CARDURA   Take 8 mg by mouth at bedtime.      furosemide 20 MG tablet   Commonly known as: LASIX   Take 20 mg by mouth daily.      gemfibrozil 600 MG tablet   Commonly known as: LOPID   Take 600 mg by mouth 2 (two) times daily.      metoprolol succinate 50 MG 24 hr tablet   Commonly known as: TOPROL-XL   Take 25-50 mg by mouth daily. Take one tab (50mg ) every morning & 1/2 tab (25mg ) every evening      naproxen 500 MG tablet   Commonly known as: NAPROSYN   Take 500 mg by mouth 2 (two) times daily as needed. For pain      oxyCODONE-acetaminophen 5-325 MG per tablet   Commonly known as:  PERCOCET/ROXICET   Take 1 tablet by mouth every 4 (four) hours as needed for pain.           Follow-up Information    Follow up with Eldred Manges, MD. Schedule an appointment as soon as possible for a visit in 1 week.   Contact information:   [facility] [address] [place] [address] [phone]          Signed: Eldred Manges 07/22/2012, 6:56 AM

## 2012-07-22 NOTE — Op Note (Signed)
Craig [name], Craig [name]                 ACCOUNT NO.:  [id number]  MEDICAL RECORD NO.:  [id number]  LOCATION:  [id number]                        FACILITY:  [facility]  PHYSICIAN:  Symphony Demuro C. Ophelia Charter, M.D.    DATE OF BIRTH:  02-03-1935  DATE OF PROCEDURE:  07/21/2012 DATE OF DISCHARGE:                              OPERATIVE REPORT   PREOPERATIVE DIAGNOSIS:  Left L5-S1 herniated nucleus pulposus with foraminal stenosis.  POSTOPERATIVE DIAGNOSIS:  Left L5-S1 herniated nucleus pulposus with foraminal stenosis.  PROCEDURE:  Left L5-S1 microdiskectomy, foraminotomy.  SURGEON:  Jenell Dobransky C. Ophelia Charter, MD  ASSISTANT:  Wende Neighbors, PA-C, medically necessary and present for the entire procedure.  INDICATIONS:  GOT plus Marcaine skin local.  ESTIMATED BLOOD LOSS:  Minimal.  FINDINGS:  Foraminal stenosis, multifactorial with ligamentum hypertrophy, overhanging facet screws and disk protrusion with nerve root compression at the entry into the foramina.  PROCEDURE:  After induction of general anesthesia and orotracheal intubation, the patient was placed in the prone position over chest rolls, arms at 90 and 90, pads over the shoulders, had some stiffness of the shoulders and extra pads were added since he had limited abduction. Back was prepped with DuraPrep.  Calf bumper was applied.  Warmer was applied.  Preoperative Ancef was given and time-out procedure was completed while the DuraPrep was drying.  Area was squared with towels, sterile Betadine, Steri-Drape application, laminectomy sheet and spinal needle localization at the planned 5-1 level.  Cross-table lateral x-ray confirmed this at the appropriate level.  Incision was made. Subperiosteal dissection on the lamina on the left side was performed. Operative microscope was draped while Penfield 4 was placed and second x- ray was taken.  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  Initially, there was a small incision  of the ligamentum at this level.  There was no motion at this level and Penfield 4 was placed at it.  The lamina at the S1 was not very wide from cephalad to caudad.  The ligamentum that was stretched over the L5- S1 interspace was tight thick, it was incised and then Penfield 4 was placed down to the base.  Next, the disk and another x-ray was taken, which was compared by the radiologist who agreed, this is the planned level of the foraminal stenosis.  Taylor retractor was placed laterally. Foraminotomy was performed.  Thick chunks of ligament were removed. There was overhanging spurs that was pressing on the nerve root and there was a disk protrusion with minimal amount of degenerative disk material removed centrally and most of it had extruded out in the lateral gutter and out to the mid pedicle level.  Using the ball-tipped nerve hook, Epstein and hockey stick pieces of disk material were pushed down centrally and then grabbed with a micro-down-biter to help remove them decompressing the nerve root performing a foraminotomy.  Hockey stick was passed into the nerve root, 108 degrees sweep with no areas of compression.  Dura was intact.  Some final bone trimming with bone removed out to the level of the pedicle in the lateral wall.  After irrigation with saline solution, deep fascia  was closed with #1 Vicryl, 2-0 Vicryl in the subcutaneous tissue, subcuticular skin closure.  Dermabond, postop dressing, and then transferred to the recovery room in stable condition. Instrument count and needle count were correct.     Camara Renstrom C. Ophelia Charter, M.D.     MCY/MEDQ  D:  07/21/2012  T:  07/22/2012  Job:  [id number]

## 2012-07-22 NOTE — Progress Notes (Signed)
Subjective: 1 Day Post-Op Procedure(s) (LRB): MICRODISCECTOMY LUMBAR LAMINECTOMY (Left) Patient reports pain as mild.    Objective: Vital signs in last 24 hours: Temp:  [97.6 F (36.4 C)-98 F (36.7 C)] 97.8 F (36.6 C) (10/07 2344) Pulse Rate:  [66-84] 70  (10/07 2344) Resp:  [11-20] 18  (10/07 2344) BP: (95-145)/(53-71) 118/69 mmHg (10/07 2344) SpO2:  [94 %-100 %] 98 % (10/07 2344)  Intake/Output from previous day: 10/07 0701 - 10/08 0700 In: 2250 [I.V.:2150; IV Piggyback:100] Out: 500 [Urine:475; Blood:25] Intake/Output this shift: Total I/O In: 850 [I.V.:750; IV Piggyback:100] Out: 475 [Urine:475]  No results found for this basename: HGB:5 in the last 72 hours No results found for this basename: WBC:2,RBC:2,HCT:2,PLT:2 in the last 72 hours No results found for this basename: NA:2,K:2,CL:2,CO2:2,BUN:2,CREATININE:2,GLUCOSE:2,CALCIUM:2 in the last 72 hours No results found for this basename: LABPT:2,INR:2 in the last 72 hours  Neurologically intact  Assessment/Plan: 1 Day Post-Op Procedure(s) (LRB): MICRODISCECTOMY LUMBAR LAMINECTOMY (Left) Plan"    D/C Home.  offic e one week  Torrie Lafavor C 07/22/2012, 6:53 AM

## 2012-07-25 ENCOUNTER — Encounter (HOSPITAL_COMMUNITY): Payer: Self-pay | Admitting: Orthopaedic Surgery

## 2012-08-07 ENCOUNTER — Other Ambulatory Visit: Payer: Self-pay | Admitting: Cardiology

## 2013-06-10 ENCOUNTER — Encounter: Payer: Self-pay | Admitting: Cardiology

## 2013-06-10 ENCOUNTER — Ambulatory Visit (INDEPENDENT_AMBULATORY_CARE_PROVIDER_SITE_OTHER): Payer: Medicare Other | Admitting: Cardiology

## 2013-06-10 VITALS — BP 131/86 | HR 89 | Ht 73.0 in | Wt 221.0 lb

## 2013-06-10 DIAGNOSIS — I2510 Atherosclerotic heart disease of native coronary artery without angina pectoris: Secondary | ICD-10-CM

## 2013-06-10 DIAGNOSIS — E785 Hyperlipidemia, unspecified: Secondary | ICD-10-CM

## 2013-06-10 DIAGNOSIS — I4891 Unspecified atrial fibrillation: Secondary | ICD-10-CM

## 2013-06-10 DIAGNOSIS — I4821 Permanent atrial fibrillation: Secondary | ICD-10-CM

## 2013-06-10 NOTE — Progress Notes (Signed)
Clinical Summary Craig [name] is a 78 y.o.male presenting for office visit. He is a former patient of Craig [name], last seen in August 2013. He reports no sense of palpitations, no exertional chest pain.  Record review finds microdiscectomy lumbar laminectomy left L5-S1 back in October 2013. No obvious perioperative cardiac issues noted.  Reports compliance with his medications. I did address stroke risk with atrial fibrillation, he continues to prefer aspirin to any other anticoagulants.  ECG today shows atrial fibrillation with controlled rate, diffuse nonspecific ST T changes with T-wave inversions are old.  Tells me that he cares for his wife at home with advancing Parkinson's disease.   No Known Allergies  Current Outpatient Prescriptions  Medication Sig Dispense Refill  . allopurinol (ZYLOPRIM) 300 MG tablet Take 300 mg by mouth daily.       Marland Kitchen amLODipine (NORVASC) 5 MG tablet Take 5 mg by mouth daily.       Marland Kitchen aspirin 81 MG EC tablet Take 81 mg by mouth daily.       Marland Kitchen atorvastatin (LIPITOR) 20 MG tablet Take 20 mg by mouth daily.       . betamethasone valerate (VALISONE) 0.1 % cream Apply 1 application topically 2 (two) times daily as needed. For dry skin      . doxazosin (CARDURA) 8 MG tablet Take 8 mg by mouth at bedtime.       . furosemide (LASIX) 20 MG tablet Take 20 mg by mouth daily.       Marland Kitchen gemfibrozil (LOPID) 600 MG tablet Take 600 mg by mouth 2 (two) times daily.       . metoprolol succinate (TOPROL-XL) 50 MG 24 hr tablet TAKE 1 TABLET (50 MG)      EVERY MORNING AND 1/2      TABLET (25 MG) EVERY       EVENING  45 tablet  3   No current facility-administered medications for this visit.    Past Medical History  Diagnosis Date  . Mixed hyperlipidemia   . Essential hypertension, benign   . Coronary atherosclerosis of native coronary artery     Nonobstructive at catheterization 2006  . Permanent atrial fibrillation     Declined anticoagulation with Craig [name] 06/2012  .  Arthritis     Social History Craig [name] reports that he quit smoking about 30 years ago. His smoking use included Cigarettes. He has a 30 pack-year smoking history. He has never used smokeless tobacco. Craig [name] reports that he drinks about 8.4 ounces of alcohol per week.  Review of Systems Negative except as outlined.  Physical Examination Filed Vitals:   06/10/13 0912  BP: 131/86  Pulse: 89   Filed Weights   06/10/13 0912  Weight: 221 lb (100.245 kg)   Appears comfortable at rest. HEENT: Conjunctiva and lids normal, oropharynx clear. Neck: Supple, no elevated JVP or carotid bruits, no thyromegaly. Lungs: Clear to auscultation, nonlabored breathing at rest. Cardiac: Irregularly irregular, no S3 or significant systolic murmur, no pericardial rub. Abdomen: Soft, nontender, bowel sounds present. Extremities: No pitting edema, distal pulses 2+.   Problem List and Plan   Permanent atrial fibrillation Continue aspirin and Toprol-XL. Heart rate is adequately controlled based on ECG, he reports no palpitations or breathlessness. He continues to decline anticoagulation.  Coronary atherosclerosis of native coronary artery Nonobstructive at catheterization 2006. No angina symptoms. Has chronically abnormal ECG with diffuse ST-T wave changes.  HYPERLIPIDEMIA Continues on Lipitor and Lopid, followed by Craig [name].  Craig [name], M.D., F.A.C.C.

## 2013-06-10 NOTE — Assessment & Plan Note (Addendum)
Continue aspirin and Toprol-XL. Heart rate is adequately controlled based on ECG, he reports no palpitations or breathlessness. He continues to decline anticoagulation.

## 2013-06-10 NOTE — Assessment & Plan Note (Signed)
Nonobstructive at catheterization 2006. No angina symptoms. Has chronically abnormal ECG with diffuse ST-T wave changes.

## 2013-06-10 NOTE — Assessment & Plan Note (Signed)
Continues on Lipitor and Lopid, followed by Dr. Sherril Croon.

## 2013-06-10 NOTE — Patient Instructions (Addendum)

## 2014-06-10 ENCOUNTER — Encounter: Payer: Self-pay | Admitting: Cardiology

## 2014-06-10 ENCOUNTER — Ambulatory Visit (INDEPENDENT_AMBULATORY_CARE_PROVIDER_SITE_OTHER): Payer: Medicare Other | Admitting: Cardiology

## 2014-06-10 VITALS — BP 127/76 | HR 80 | Ht 73.0 in | Wt 217.0 lb

## 2014-06-10 DIAGNOSIS — E785 Hyperlipidemia, unspecified: Secondary | ICD-10-CM

## 2014-06-10 DIAGNOSIS — I4821 Permanent atrial fibrillation: Secondary | ICD-10-CM

## 2014-06-10 DIAGNOSIS — I2510 Atherosclerotic heart disease of native coronary artery without angina pectoris: Secondary | ICD-10-CM

## 2014-06-10 DIAGNOSIS — I10 Essential (primary) hypertension: Secondary | ICD-10-CM

## 2014-06-10 DIAGNOSIS — I4891 Unspecified atrial fibrillation: Secondary | ICD-10-CM

## 2014-06-10 NOTE — Assessment & Plan Note (Signed)
Blood pressure control is good today. 

## 2014-06-10 NOTE — Progress Notes (Signed)
    Clinical Summary Craig [name] is a 79 y.o.male last seen in August 2014. He reports no chest pain or palpitations. Remains consistent with his medical regimen outlined below. He has preferred aspirin to anticoagulants long-term.  ECG today shows atrial fibrillation, rate controlled, with inferior and anterolateral T wave inversions that have not changed compared to serial tracing.  Continues to care for his wife at home who has advanced dementia.  Recent lab work reviewed primary care. Lipid panel showed cholesterol 160, triglycerides 121, HDL 52, and LDL 84.  No Known Allergies  Current Outpatient Prescriptions  Medication Sig Dispense Refill  . allopurinol (ZYLOPRIM) 300 MG tablet Take 300 mg by mouth daily.       Marland Kitchen amLODipine (NORVASC) 5 MG tablet Take 5 mg by mouth daily.       Marland Kitchen aspirin 81 MG EC tablet Take 81 mg by mouth daily.       Marland Kitchen atorvastatin (LIPITOR) 20 MG tablet Take 20 mg by mouth daily.       . betamethasone valerate (VALISONE) 0.1 % cream Apply 1 application topically 2 (two) times daily as needed. For dry skin      . doxazosin (CARDURA) 8 MG tablet Take 8 mg by mouth at bedtime.       . furosemide (LASIX) 20 MG tablet Take 20 mg by mouth daily.       Marland Kitchen gemfibrozil (LOPID) 600 MG tablet Take 600 mg by mouth 2 (two) times daily.       . metoprolol succinate (TOPROL-XL) 50 MG 24 hr tablet TAKE 1 TABLET (50 MG)      EVERY MORNING AND 1/2      TABLET (25 MG) EVERY       EVENING  45 tablet  3   No current facility-administered medications for this visit.    Past Medical History  Diagnosis Date  . Mixed hyperlipidemia   . Essential hypertension, benign   . Coronary atherosclerosis of native coronary artery     Nonobstructive at catheterization 2006  . Permanent atrial fibrillation     Declined anticoagulation with Dr. Andee Lineman 06/2012  . Arthritis     Social History Craig [name] reports that he quit smoking about 31 years ago. His smoking use included Cigarettes. He has  a 30 pack-year smoking history. He has never used smokeless tobacco. Craig [name] reports that he drinks about 8.4 ounces of alcohol per week.  Review of Systems No falls, no dizziness. Stable appetite. Other systems reviewed and negative  Physical Examination Filed Vitals:   06/10/14 1313  BP: 127/76  Pulse: 80   Filed Weights   06/10/14 1313  Weight: 217 lb (98.431 kg)    Appears comfortable at rest.  HEENT: Conjunctiva and lids normal, oropharynx clear.  Neck: Supple, no elevated JVP or carotid bruits, no thyromegaly.  Lungs: Clear to auscultation, nonlabored breathing at rest.  Cardiac: Irregularly irregular, no S3 or significant systolic murmur, no pericardial rub.  Abdomen: Soft, nontender, bowel sounds present.  Extremities: No pitting edema, distal pulses 2+.   Problem List and Plan   Permanent atrial fibrillation Continue current medical regimen. He has declined anticoagulation over time. ECG is stable today.  Essential hypertension, benign Blood pressure control is good today.  Coronary atherosclerosis of native coronary artery No angina symptoms with history of nonobstructive CAD. Continues on aspirin and statin.  HYPERLIPIDEMIA Continues on Lipitor, LDL 84.    Jonelle Sidle, M.D., F.A.C.C.

## 2014-06-10 NOTE — Assessment & Plan Note (Signed)
Continues on Lipitor, LDL 84.

## 2014-06-10 NOTE — Assessment & Plan Note (Signed)
Continue current medical regimen. He has declined anticoagulation over time. ECG is stable today.

## 2014-06-10 NOTE — Assessment & Plan Note (Signed)
No angina symptoms with history of nonobstructive CAD. Continues on aspirin and statin.

## 2014-06-10 NOTE — Patient Instructions (Signed)

## 2015-06-14 ENCOUNTER — Ambulatory Visit (INDEPENDENT_AMBULATORY_CARE_PROVIDER_SITE_OTHER): Payer: Medicare Other | Admitting: Cardiology

## 2015-06-14 ENCOUNTER — Encounter: Payer: Self-pay | Admitting: *Deleted

## 2015-06-14 ENCOUNTER — Encounter: Payer: Self-pay | Admitting: Cardiology

## 2015-06-14 VITALS — BP 140/90 | HR 82 | Ht 73.0 in | Wt 210.8 lb

## 2015-06-14 DIAGNOSIS — I2510 Atherosclerotic heart disease of native coronary artery without angina pectoris: Secondary | ICD-10-CM | POA: Diagnosis not present

## 2015-06-14 DIAGNOSIS — R9431 Abnormal electrocardiogram [ECG] [EKG]: Secondary | ICD-10-CM | POA: Diagnosis not present

## 2015-06-14 DIAGNOSIS — I4821 Permanent atrial fibrillation: Secondary | ICD-10-CM

## 2015-06-14 DIAGNOSIS — E785 Hyperlipidemia, unspecified: Secondary | ICD-10-CM | POA: Diagnosis not present

## 2015-06-14 DIAGNOSIS — I482 Chronic atrial fibrillation: Secondary | ICD-10-CM

## 2015-06-14 NOTE — Patient Instructions (Signed)
Your physician recommends that you continue on your current medications as directed. Please refer to the Current Medication list given to you today. Your physician has requested that you have an echocardiogram. Echocardiography is a painless test that uses sound waves to create images of your heart. It provides your doctor with information about the size and shape of your heart and how well your heart's chambers and valves are working. This procedure takes approximately one hour. There are no restrictions for this procedure. Your physician recommends that you schedule a follow-up appointment in: 1 year. You will receive a reminder letter in the mail in about 10 months reminding you to call and schedule your appointment. If you don't receive this letter, please contact our office. 

## 2015-06-14 NOTE — Progress Notes (Signed)
Cardiology Office Note  Date: 06/14/2015   ID: Craig [name], DOB 05-Oct-1935, MRN [id number]  PCP: Ignatius Specking., MD  Primary Cardiologist: Nona Dell, MD   Chief Complaint  Patient presents with  . Atrial Fibrillation  . Coronary Artery Disease    History of Present Illness: Craig [name] is a 80 y.o. male last seen in August 2015. He presents for a routine follow-up visit. From a symptom perspective he reports no problems with palpitations or exertional chest pain, has stable NYHA class II dyspnea with typical activities. He enjoys working outdoors, has a Engineer, agricultural garden and also feeds birds.  His wife is in a rest home with advancing dementia. He visits her every day, lives nearby in [place].  He continues on aspirin and Toprol-XL for management of atrial fibrillation, has declined anticoagulation over time. Follow-up ECG is chronically abnormal with fairly significant inferior and anterolateral T wave inversions. Has the appearance of possible hypertrophic cardiomyopathy, although this was not described by echocardiogram back in 2006.  He continues to follow with Dr. Sherril Croon.  Past Medical History  Diagnosis Date  . Mixed hyperlipidemia   . Essential hypertension, benign   . Coronary atherosclerosis of native coronary artery     Nonobstructive at catheterization 2006  . Permanent atrial fibrillation     Declined anticoagulation with Dr. Andee Lineman 06/2012  . Arthritis     Past Surgical History  Procedure Laterality Date  . Joint replacement      hip left  . Microdiscectomy lumbar  07/21/2012  . Lumbar laminectomy  07/21/2012    Procedure: MICRODISCECTOMY LUMBAR LAMINECTOMY;  Surgeon: Eldred Manges, MD;  Location: [facility] OR;  Service: Orthopedics;  Laterality: Left;  Left L5-S1 Foraminotomy, Microdiscectomy    Current Outpatient Prescriptions  Medication Sig Dispense Refill  . allopurinol (ZYLOPRIM) 300 MG tablet Take 300 mg by mouth daily.     Marland Kitchen amLODipine (NORVASC) 5  MG tablet Take 5 mg by mouth daily.     Marland Kitchen aspirin 81 MG EC tablet Take 81 mg by mouth daily.     Marland Kitchen atorvastatin (LIPITOR) 20 MG tablet Take 20 mg by mouth daily.     Marland Kitchen doxazosin (CARDURA) 8 MG tablet Take 8 mg by mouth at bedtime.     . furosemide (LASIX) 20 MG tablet Take 20 mg by mouth daily.     Marland Kitchen gemfibrozil (LOPID) 600 MG tablet Take 600 mg by mouth 2 (two) times daily.     . metoprolol succinate (TOPROL-XL) 50 MG 24 hr tablet TAKE 1 TABLET (50 MG)      EVERY MORNING AND 1/2      TABLET (25 MG) EVERY       EVENING 45 tablet 3   No current facility-administered medications for this visit.    Allergies:  Review of patient's allergies indicates no known allergies.   Social History: The patient  reports that he quit smoking about 32 years ago. His smoking use included Cigarettes. He has a 30 pack-year smoking history. He has never used smokeless tobacco. He reports that he drinks about 8.4 oz of alcohol per week. He reports that he does not use illicit drugs.   ROS:  Please see the history of present illness. Otherwise, complete review of systems is positive for mild arthritic pains.  All other systems are reviewed and negative.   Physical Exam: VS:  BP 140/90 mmHg  Pulse 82  Ht  (1.854 m)  Wt 210 lb 12.8 oz (  95.618 kg)  BMI 27.82 kg/m2  SpO2 100%, BMI Body mass index is 27.82 kg/(m^2).  Wt Readings from Last 3 Encounters:  06/14/15 210 lb 12.8 oz (95.618 kg)  06/10/14 217 lb (98.431 kg)  06/10/13 221 lb (100.245 kg)     Appears comfortable at rest.  HEENT: Conjunctiva and lids normal, oropharynx clear.  Neck: Supple, no elevated JVP or carotid bruits, no thyromegaly.  Lungs: Clear to auscultation, nonlabored breathing at rest.  Cardiac: Irregularly irregular, no S3 or significant systolic murmur, no pericardial rub.  Abdomen: Soft, nontender, bowel sounds present.  Extremities: No pitting edema, distal pulses 2+. Skin: Warm and dry. Musculoskeletal: No  kyphosis. Neuropsychiatric: Alert and oriented 3, affect appropriate.   ECG: ECG is ordered today and shows rate-controlled atrial fibrillation with significant inferior and anterolateral T wave inversions that are deep, overall stable compared to prior tracings.   Other Studies Reviewed Today:  Echocardiogram from 09/10/2005 reported mild LVH with LVEF 70-75%, no wall motion abnormalities, mildly dilated left and right atrium, sclerotic aortic valve without stenosis, mitral annular calcification, trace tricuspid regurgitation, no pericardial effusion.  Assessment and Plan:  1. Chronic atrial fibrillation. Patient continues on aspirin and atenolol, overall asymptomatic. He has declined anticoagulation over time.  2. Abnormal ECG with significant T wave inversions as outlined above, old compared to previous tracings. Most likely repolarization abnormalities, we will obtain an echocardiogram to reassess cardiac structure and function.  3. History of nonobstructive coronary atherosclerosis by previous evaluation in 2006, no active angina symptoms. He continues on aspirin and statin therapy.  4. Hyperlipidemia, on Lipitor, followed by Dr. Sherril Croon. Will obtain most recent lab work for review.  Current medicines were reviewed with the patient today.   Orders Placed This Encounter  Procedures  . EKG 12-Lead    Disposition: FU with me in 1 year.   Signed, Jonelle Sidle, MD, [facility] 06/14/2015 9:18 AM    [facility] at [facility] [address] [place], [place], [place] [address] Phone: [phone]; Fax: [phone]

## 2015-06-16 ENCOUNTER — Other Ambulatory Visit: Payer: Self-pay

## 2015-06-16 ENCOUNTER — Ambulatory Visit (INDEPENDENT_AMBULATORY_CARE_PROVIDER_SITE_OTHER): Payer: Medicare Other

## 2015-06-16 ENCOUNTER — Telehealth: Payer: Self-pay | Admitting: *Deleted

## 2015-06-16 DIAGNOSIS — I482 Chronic atrial fibrillation: Secondary | ICD-10-CM | POA: Diagnosis not present

## 2015-06-16 DIAGNOSIS — I4821 Permanent atrial fibrillation: Secondary | ICD-10-CM

## 2015-06-16 NOTE — Telephone Encounter (Signed)
-----   Message from Jonelle Sidle, MD sent at 06/16/2015 10:57 AM EDT ----- Reviewed. Please let him know that LVEF remains normal with LVH as seen previously. No major valvular abnormalities of symptomatic concern at this time. We will continue observation.

## 2015-06-21 NOTE — Telephone Encounter (Signed)
Patient informed. 

## 2016-06-13 NOTE — Progress Notes (Signed)
Cardiology Office Note  Date: 06/14/2016   ID: [name], DOB 07-08-1935, MRN [id number]  PCP: Ignatius Specking, MD  Primary Cardiologist: Nona Dell, MD   Chief Complaint  Patient presents with  . Atrial Fibrillation    History of Present Illness: Craig [name] is a 81 y.o. male last seen in August 2016. He presents for a routine follow-up visit. Does not report any significant palpitations or chest pain. He tells me that his wife of 54 years passed away last Jul 28, 2023. He lives alone in the same house, has been taking care of his ADLs. Has other family support.  I reviewed his medications. He stoped taking aspirin due to easy bruising. He has consistently declined anticoagulation and we discussed this again today. CHADSVASC score is 4. He agreed to try to take baby aspirin at least every other day, although this is not optimal for stroke prophylaxis. Otherwise he continues on Toprol-XL with good heart rate control. He has had no dizziness or syncope.  He had a physical with Dr. Sherril Croon back in June. Requesting lab work for review. He does remain on Lipitor with known vascular disease and hyperlipidemia. LDL was 91 last year.  I reviewed his ECG today which shows rate-controlled atrial fibrillation with diffuse ST-T wave abnormalities, most notable in the inferior and anterolateral leads (old).   Past Medical History:  Diagnosis Date  . Arthritis   . Coronary atherosclerosis of native coronary artery    Nonobstructive at catheterization 2006  . Essential hypertension, benign   . Mixed hyperlipidemia   . Permanent atrial fibrillation (HCC)    Declined anticoagulation with Dr. Andee Lineman 06/2012    Past Surgical History:  Procedure Laterality Date  . JOINT REPLACEMENT     hip left  . LUMBAR LAMINECTOMY  07/21/2012   Procedure: MICRODISCECTOMY LUMBAR LAMINECTOMY;  Surgeon: Eldred Manges, MD;  Location: [facility] OR;  Service: Orthopedics;  Laterality: Left;  Left L5-S1 Foraminotomy,  Microdiscectomy  . MICRODISCECTOMY LUMBAR  07/21/2012    Current Outpatient Prescriptions  Medication Sig Dispense Refill  . allopurinol (ZYLOPRIM) 300 MG tablet Take 300 mg by mouth daily.     Marland Kitchen amLODipine (NORVASC) 5 MG tablet Take 5 mg by mouth daily.     Marland Kitchen aspirin 81 MG EC tablet Take 81 mg by mouth every other day.    Marland Kitchen atorvastatin (LIPITOR) 20 MG tablet Take 20 mg by mouth daily.     Marland Kitchen doxazosin (CARDURA) 8 MG tablet Take 8 mg by mouth at bedtime.     . furosemide (LASIX) 20 MG tablet Take 20 mg by mouth daily.     Marland Kitchen gemfibrozil (LOPID) 600 MG tablet Take 600 mg by mouth 2 (two) times daily.     . metoprolol succinate (TOPROL-XL) 50 MG 24 hr tablet TAKE 1 TABLET (50 MG)      EVERY MORNING AND 1/2      TABLET (25 MG) EVERY       EVENING 45 tablet 3  . naproxen (NAPROSYN) 500 MG tablet Take 500 mg by mouth as needed.    Marland Kitchen SILDENAFIL CITRATE PO Take 20 mg by mouth.     No current facility-administered medications for this visit.    Allergies:  Review of patient's allergies indicates no known allergies.   Social History: The patient  reports that he quit smoking about 33 years ago. His smoking use included Cigarettes. He has a 30.00 pack-year smoking history. He has never used smokeless tobacco.  He reports that he drinks about 8.4 oz of alcohol per week . He reports that he does not use drugs.   ROS:  Please see the history of present illness. Otherwise, complete review of systems is positive for occasional gout flares.  All other systems are reviewed and negative.   Physical Exam: VS:  BP 132/68   Pulse 78   Ht 6\' 1"  (1.854 m)   Wt 214 lb (97.1 kg)   SpO2 99%   BMI 28.23 kg/m , BMI Body mass index is 28.23 kg/m.  Wt Readings from Last 3 Encounters:  06/14/16 214 lb (97.1 kg)  06/14/15 210 lb 12.8 oz (95.6 kg)  06/10/14 217 lb (98.4 kg)    Elderly male, appears comfortable at rest.  HEENT: Conjunctiva and lids normal, oropharynx clear.  Neck: Supple, no elevated JVP or  carotid bruits, no thyromegaly.  Lungs: Clear to auscultation, nonlabored breathing at rest.  Cardiac: Irregularly irregular, no S3 or significant systolic murmur, no pericardial rub.  Abdomen: Soft, nontender, bowel sounds present.  Extremities: No pitting edema, distal pulses 2+. Skin: Warm and dry. Musculoskeletal: No kyphosis. Neuropsychiatric: Alert and oriented 3, affect appropriate.  ECG: I personally reviewed the tracing from 06/14/2015 which showed rate-controlled atrial fibrillation with pronounced inferior and anterolateral ST/T-wave abnormalities that are old.  Recent Labwork:  June 2016: TSH 2.01, cholesterol 159, triglycerides 125, HDL 43, LDL 91, hemoglobin 16.5, BUN 11, creatinine 1.2, potassium 4.2, platelets 198, AST 30, ALT 18  Other Studies Reviewed Today:  Echocardiogram 09/10/2005: Mild LVH with LVEF 70-75%, no wall motion abnormalities, mildly dilated left and right atrium, sclerotic aortic valve without stenosis, mitral annular calcification, trace tricuspid regurgitation, no pericardial effusion.  Assessment and Plan:  1. Chronic atrial fibrillation. Asymptomatic in terms of palpitations and with adequate heart rate control on Toprol-XL. As best course for, although he consistently declined anticoagulation. He is had easy bruising on aspirin, will try and take a baby aspirin every other day. No other changes made.  2. History of nonobstructive CAD without angina symptoms. ECG is chronically abnormal as outlined above. Continue aspirin and statin therapy. No clear indication for follow-up ischemic testing at this time.  3. Hyperlipidemia, continues on statin therapy. Last LDL was 91. Requesting most recent lab work from Dr. Sherril CroonVyas.  4. Essential hypertension, blood pressure is adequately controlled today. No changes were made.  Current medicines were reviewed with the patient today.   Orders Placed This Encounter  Procedures  . EKG 12-Lead     Disposition: Follow-up with me in one year.  Signed, Jonelle SidleSamuel G. Worley Radermacher, MD, [facility] 06/14/2016 8:20 AM    [facility] [address], [place], [place] [address] Phone: [phone]; Fax: [phone]

## 2016-06-14 ENCOUNTER — Encounter: Payer: Self-pay | Admitting: *Deleted

## 2016-06-14 ENCOUNTER — Ambulatory Visit (INDEPENDENT_AMBULATORY_CARE_PROVIDER_SITE_OTHER): Payer: Medicare Other | Admitting: Cardiology

## 2016-06-14 ENCOUNTER — Encounter: Payer: Self-pay | Admitting: Cardiology

## 2016-06-14 VITALS — BP 132/68 | HR 78 | Ht 73.0 in | Wt 214.0 lb

## 2016-06-14 DIAGNOSIS — R9431 Abnormal electrocardiogram [ECG] [EKG]: Secondary | ICD-10-CM

## 2016-06-14 DIAGNOSIS — I4821 Permanent atrial fibrillation: Secondary | ICD-10-CM

## 2016-06-14 DIAGNOSIS — I2510 Atherosclerotic heart disease of native coronary artery without angina pectoris: Secondary | ICD-10-CM

## 2016-06-14 DIAGNOSIS — I10 Essential (primary) hypertension: Secondary | ICD-10-CM

## 2016-06-14 DIAGNOSIS — E785 Hyperlipidemia, unspecified: Secondary | ICD-10-CM

## 2016-06-14 DIAGNOSIS — I482 Chronic atrial fibrillation: Secondary | ICD-10-CM | POA: Diagnosis not present

## 2016-06-14 NOTE — Patient Instructions (Signed)
Medication Instructions:   Resume Aspirin every other day.   Continue all other current medications.  Labwork: none  Testing/Procedures: none  Follow-Up: Your physician wants you to follow up in:  1 year.  You will receive a reminder letter in the mail one-two months in advance.  If you don't receive a letter, please call our office to schedule the follow up appointment   Any Other Special Instructions Will Be Listed Below (If Applicable).  If you need a refill on your cardiac medications before your next appointment, please call your pharmacy.

## 2017-06-04 ENCOUNTER — Ambulatory Visit: Payer: Medicare Other | Admitting: Cardiology

## 2017-06-18 NOTE — Progress Notes (Signed)
Cardiology Office Note  Date: 06/19/2017   ID: Craig [name], DOB 01-May-1935, MRN [id number]  PCP: Craig [name], Craig B, MD  Primary Cardiologist: Craig DellSamuel McDowell, MD   Chief Complaint  Patient presents with  . Atrial Fibrillation    History of Present Illness: Craig [name] is a 82 y.o. male last seen in August 2017. He presents for a routine follow-up visit. Reports no palpitations or chest pain. 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 He reports NYHA class II dyspnea, no change in stamina.  He has consistently declined anticoagulation, CHADSVASC score is 4. He also reports difficulty taking even low-dose aspirin due to significant bruising.  I personally reviewed his ECG today which shows rate-controlled atrial fibrillation with chronic ST-T wave abnormalities. He is on Toprol-XL 50 mg daily for heart rate control.  Past Medical History:  Diagnosis Date  . Arthritis   . Coronary atherosclerosis of native coronary artery    Nonobstructive at catheterization 2006  . Essential hypertension, benign   . Mixed hyperlipidemia   . Permanent atrial fibrillation (HCC)    Declined anticoagulation with Dr. Andee [name] 06/2012    Past Surgical History:  Procedure Laterality Date  . JOINT REPLACEMENT     hip left  . LUMBAR LAMINECTOMY  07/21/2012   Procedure: MICRODISCECTOMY LUMBAR LAMINECTOMY;  Surgeon: Craig MangesMark C Yates, MD;  [facility];  Service: Orthopedics;  Laterality: Left;  Left L5-S1 Foraminotomy, Microdiscectomy  . MICRODISCECTOMY LUMBAR  07/21/2012    Current Outpatient Prescriptions  Medication Sig Dispense Refill  . allopurinol (ZYLOPRIM) 300 MG tablet Take 300 mg by mouth daily.     Marland Kitchen. amLODipine (NORVASC) 5 MG tablet Take 5 mg by mouth daily.     Marland Kitchen. aspirin 81 MG EC tablet Take 81 mg by mouth every other day.    Marland Kitchen. atorvastatin (LIPITOR) 20 MG tablet Take 20 mg by mouth daily.     Marland Kitchen. doxazosin (CARDURA) 8 MG tablet Take 8 mg by mouth at bedtime.      . furosemide (LASIX) 20 MG tablet Take 20 mg by mouth daily.     Marland Kitchen. gemfibrozil (LOPID) 600 MG tablet Take 600 mg by mouth 2 (two) times daily.     . metoprolol succinate (TOPROL-XL) 50 MG 24 hr tablet TAKE 1 TABLET (50 MG)      EVERY MORNING AND 1/2      TABLET (25 MG) EVERY       EVENING 45 tablet 3  . naproxen (NAPROSYN) 500 MG tablet Take 500 mg by mouth as needed.    Marland Kitchen. SILDENAFIL CITRATE PO Take 20 mg by mouth.     No current facility-administered medications for this visit.    Allergies:  Patient has no known allergies.   Social History: The patient  reports that he quit smoking about 34 years ago. His smoking use included Cigarettes. He has a 30.00 pack-year smoking history. He has never used smokeless tobacco. He reports that he drinks about 8.4 oz of alcohol per week . He reports that he does not use drugs.   ROS:  Please see the history of present illness. Otherwise, complete review of systems is positive for none.  All other systems are reviewed and negative.   Physical Exam: VS:  BP 112/72   Pulse 78   Ht 6\' 1"  (1.854 m)   Wt 200 lb (90.7 kg)   SpO2 98%   BMI 26.39 kg/m ,  BMI Body mass index is 26.39 kg/m.  Wt Readings from Last 3 Encounters:  06/19/17 200 lb (90.7 kg)  06/14/16 214 lb (97.1 kg)  06/14/15 210 lb 12.8 oz (95.6 kg)    General: Elderly male, appears comfortable at rest. HEENT: Conjunctiva and lids normal, oropharynx clear. Neck: Supple, no elevated JVP or carotid bruits, no thyromegaly. Lungs: Clear to auscultation, nonlabored breathing at rest. Cardiac: Irregularly irregular, no S3 or significant systolic murmur, no pericardial rub. Abdomen: Soft, nontender, bowel sounds present. Extremities: No pitting edema, distal pulses 2+.  ECG: I personally reviewed the tracing from 06/14/2016 which showed rate-controlled atrial fibrillation with diffuse ST-T wave abnormalities.  Recent Labwork:  June 2017: BUN 15, creatinine 1.27, potassium 3.9, AST 29, ALT  16, cholesterol 207, triglycerides 107, HDL 43, LDL 143, hemoglobin 15.6, platelets 164, TSH 2.65  Other Studies Reviewed Today:  Echocardiogram 06/16/2015: Study Conclusions  - Left ventricle: The cavity size was normal. Wall thickness was   increased in a pattern of mild LVH with sigmoid basal septum.   Systolic function was normal. The estimated ejection fraction was   in the range of 55% to 60%. Wall motion was normal; there were no   regional wall motion abnormalities. The study is not technically   sufficient to allow evaluation of LV diastolic function. - Aortic valve: Mildly calcified annulus. Trileaflet; mildly   calcified leaflets. There was mild regurgitation. - Aortic root: The aortic root was mildly ectatic. - Mitral valve: Calcified annulus. There was mild regurgitation. - Left atrium: The atrium was mildly to moderately dilated. - Right atrium: The atrium was mildly dilated. - Tricuspid valve: There was trivial regurgitation. - Pulmonary arteries: Systolic pressure could not be accurately   estimated. - Pericardium, extracardiac: A trivial pericardial effusion was   identified posterior to the heart.  Impressions:  - Mild LVH with sigmoid basal septum and LVEF 55-60%. Indeterminate   diastolic function. Mild to moderate left atrial enlargement.   Mitral annular calcification with mild mitral regurgitation.   Mildly ectatic aortic root. Mildly sclerotic aortic valve with   mild aortic regurgitation. Mild right atrial enlargement. Unable   to assess PASP. Trivial posterior pericardial effusion.  Assessment and Plan:  1. Chronic atrial fibrillation. Heart rate adequately controlled on Toprol-XL. He declines anticoagulation as noted above, and even reports significant bruising on baby aspirin which she does not take consistently.  2. Hyperlipidemia, continues on Lipitor with follow-up per Dr. Sherril [name].  3. Essential hypertension, blood pressure is well controlled  today.  Current medicines were reviewed with the patient today.   Orders Placed This Encounter  Procedures  . EKG 12-Lead    Disposition: Follow-up in one year.  Signed, Jonelle Sidle, MD, [facility] 06/19/2017 9:47 AM    [facility] at [facility] [address] [place], [place], [place] [address] Phone: [phone]; Fax: [phone]

## 2017-06-19 ENCOUNTER — Encounter: Payer: Self-pay | Admitting: Cardiology

## 2017-06-19 ENCOUNTER — Ambulatory Visit (INDEPENDENT_AMBULATORY_CARE_PROVIDER_SITE_OTHER): Payer: Medicare Other | Admitting: Cardiology

## 2017-06-19 VITALS — BP 112/72 | HR 78 | Ht 73.0 in | Wt 200.0 lb

## 2017-06-19 DIAGNOSIS — I4821 Permanent atrial fibrillation: Secondary | ICD-10-CM

## 2017-06-19 DIAGNOSIS — I10 Essential (primary) hypertension: Secondary | ICD-10-CM | POA: Diagnosis not present

## 2017-06-19 DIAGNOSIS — E782 Mixed hyperlipidemia: Secondary | ICD-10-CM

## 2017-06-19 DIAGNOSIS — I482 Chronic atrial fibrillation: Secondary | ICD-10-CM | POA: Diagnosis not present

## 2017-06-19 NOTE — Patient Instructions (Signed)

## 2018-01-27 ENCOUNTER — Telehealth: Payer: Self-pay

## 2018-01-27 NOTE — Telephone Encounter (Signed)
Patient contacted office stating last night he had chest pain 8/10 that radiated down his arm and lasted several hours. Patient reports he took two asa 81 mg. Patient states he does not have any nitro. Patient stated he needed an appointment to see Dr. Diona BrownerMcDowell today. Questioned patient on why he did not go to the ER last night. Patient reported he did not have anyone to take him and did not think it was serious enough to call 911. Patient states this morning he is still not feeling right. Patient denies any chest pain or shortness of breath this morning. Advised patient it would be best for him to go to the ER to be evaluated by a physician due to not having any openings in office today. Patient now states he feels like it was indigestion and will not be going to the ER. Will let provider know.

## 2018-01-30 ENCOUNTER — Other Ambulatory Visit: Payer: Self-pay

## 2018-01-30 ENCOUNTER — Inpatient Hospital Stay (HOSPITAL_COMMUNITY)
Admission: EM | Admit: 2018-01-30 | Discharge: 2018-02-04 | DRG: 281 | Disposition: A | Discharge status: Home / Self Care | Payer: Medicare Other | Attending: Cardiovascular Disease | Admitting: Cardiovascular Disease

## 2018-01-30 ENCOUNTER — Emergency Department (HOSPITAL_COMMUNITY): Payer: Medicare Other

## 2018-01-30 ENCOUNTER — Encounter (HOSPITAL_COMMUNITY): Payer: Self-pay | Admitting: *Deleted

## 2018-01-30 DIAGNOSIS — I495 Sick sinus syndrome: Secondary | ICD-10-CM | POA: Diagnosis not present

## 2018-01-30 DIAGNOSIS — E785 Hyperlipidemia, unspecified: Secondary | ICD-10-CM

## 2018-01-30 DIAGNOSIS — E782 Mixed hyperlipidemia: Secondary | ICD-10-CM | POA: Diagnosis present

## 2018-01-30 DIAGNOSIS — I214 Non-ST elevation (NSTEMI) myocardial infarction: Principal | ICD-10-CM

## 2018-01-30 DIAGNOSIS — R42 Dizziness and giddiness: Secondary | ICD-10-CM

## 2018-01-30 DIAGNOSIS — R001 Bradycardia, unspecified: Secondary | ICD-10-CM | POA: Diagnosis not present

## 2018-01-30 DIAGNOSIS — N183 Chronic kidney disease, stage 3 (moderate): Secondary | ICD-10-CM | POA: Diagnosis present

## 2018-01-30 DIAGNOSIS — I482 Chronic atrial fibrillation: Secondary | ICD-10-CM | POA: Diagnosis present

## 2018-01-30 DIAGNOSIS — Z87891 Personal history of nicotine dependence: Secondary | ICD-10-CM | POA: Diagnosis not present

## 2018-01-30 DIAGNOSIS — I2510 Atherosclerotic heart disease of native coronary artery without angina pectoris: Secondary | ICD-10-CM | POA: Diagnosis present

## 2018-01-30 DIAGNOSIS — R4182 Altered mental status, unspecified: Secondary | ICD-10-CM

## 2018-01-30 DIAGNOSIS — I4821 Permanent atrial fibrillation: Secondary | ICD-10-CM | POA: Diagnosis present

## 2018-01-30 DIAGNOSIS — I129 Hypertensive chronic kidney disease with stage 1 through stage 4 chronic kidney disease, or unspecified chronic kidney disease: Secondary | ICD-10-CM | POA: Diagnosis present

## 2018-01-30 DIAGNOSIS — Z79899 Other long term (current) drug therapy: Secondary | ICD-10-CM | POA: Diagnosis not present

## 2018-01-30 DIAGNOSIS — I481 Persistent atrial fibrillation: Secondary | ICD-10-CM | POA: Diagnosis not present

## 2018-01-30 DIAGNOSIS — I351 Nonrheumatic aortic (valve) insufficiency: Secondary | ICD-10-CM | POA: Diagnosis not present

## 2018-01-30 DIAGNOSIS — I472 Ventricular tachycardia: Secondary | ICD-10-CM | POA: Diagnosis present

## 2018-01-30 DIAGNOSIS — I10 Essential (primary) hypertension: Secondary | ICD-10-CM | POA: Diagnosis present

## 2018-01-30 DIAGNOSIS — I442 Atrioventricular block, complete: Secondary | ICD-10-CM

## 2018-01-30 DIAGNOSIS — Z950 Presence of cardiac pacemaker: Secondary | ICD-10-CM

## 2018-01-30 DIAGNOSIS — N179 Acute kidney failure, unspecified: Secondary | ICD-10-CM | POA: Diagnosis present

## 2018-01-30 DIAGNOSIS — D7589 Other specified diseases of blood and blood-forming organs: Secondary | ICD-10-CM | POA: Diagnosis not present

## 2018-01-30 DIAGNOSIS — N184 Chronic kidney disease, stage 4 (severe): Secondary | ICD-10-CM | POA: Diagnosis not present

## 2018-01-30 DIAGNOSIS — Z7901 Long term (current) use of anticoagulants: Secondary | ICD-10-CM | POA: Diagnosis not present

## 2018-01-30 DIAGNOSIS — I4891 Unspecified atrial fibrillation: Secondary | ICD-10-CM

## 2018-01-30 DIAGNOSIS — Z5181 Encounter for therapeutic drug level monitoring: Secondary | ICD-10-CM | POA: Diagnosis not present

## 2018-01-30 LAB — CBC WITH DIFFERENTIAL/PLATELET
Basophils Absolute: 0 10*3/uL (ref 0.0–0.1)
Basophils Relative: 0 %
EOS ABS: 0.1 10*3/uL (ref 0.0–0.7)
EOS PCT: 1 %
HCT: 42.6 % (ref 39.0–52.0)
Hemoglobin: 14.2 g/dL (ref 13.0–17.0)
LYMPHS ABS: 1.3 10*3/uL (ref 0.7–4.0)
LYMPHS PCT: 15 %
MCH: 35.2 pg — AB (ref 26.0–34.0)
MCHC: 33.3 g/dL (ref 30.0–36.0)
MCV: 105.7 fL — AB (ref 78.0–100.0)
MONO ABS: 1 10*3/uL (ref 0.1–1.0)
Monocytes Relative: 11 %
Neutro Abs: 6.3 10*3/uL (ref 1.7–7.7)
Neutrophils Relative %: 73 %
PLATELETS: 143 10*3/uL — AB (ref 150–400)
RBC: 4.03 MIL/uL — AB (ref 4.22–5.81)
RDW: 14.3 % (ref 11.5–15.5)
WBC: 8.6 10*3/uL (ref 4.0–10.5)

## 2018-01-30 LAB — PROTIME-INR
INR: 1.22
PROTHROMBIN TIME: 15.3 s — AB (ref 11.4–15.2)

## 2018-01-30 LAB — COMPREHENSIVE METABOLIC PANEL
ALBUMIN: 3.5 g/dL (ref 3.5–5.0)
ALT: 21 U/L (ref 17–63)
AST: 48 U/L — AB (ref 15–41)
Alkaline Phosphatase: 79 U/L (ref 38–126)
Anion gap: 12 (ref 5–15)
BILIRUBIN TOTAL: 0.9 mg/dL (ref 0.3–1.2)
BUN: 33 mg/dL — AB (ref 6–20)
CHLORIDE: 104 mmol/L (ref 101–111)
CO2: 22 mmol/L (ref 22–32)
CREATININE: 1.76 mg/dL — AB (ref 0.61–1.24)
Calcium: 10.1 mg/dL (ref 8.9–10.3)
GFR calc Af Amer: 40 mL/min — ABNORMAL LOW (ref >60)
GFR, EST NON AFRICAN AMERICAN: 35 mL/min — AB (ref >60)
GLUCOSE: 113 mg/dL — AB (ref 65–99)
POTASSIUM: 4.4 mmol/L (ref 3.5–5.1)
Sodium: 138 mmol/L (ref 135–145)
Total Protein: 6.9 g/dL (ref 6.5–8.1)

## 2018-01-30 LAB — APTT: aPTT: 98 seconds — ABNORMAL HIGH (ref 24–36)

## 2018-01-30 LAB — TROPONIN I: TROPONIN I: 10.83 ng/mL — AB (ref <0.03)

## 2018-01-30 MED ORDER — SODIUM CHLORIDE 0.9 % IV SOLN
250.0000 mL | INTRAVENOUS | Status: DC | PRN
Start: 2018-01-30 — End: 2018-01-31

## 2018-01-30 MED ORDER — ASPIRIN EC 81 MG PO TBEC
81.0000 mg | DELAYED_RELEASE_TABLET | Freq: Every day | ORAL | Status: DC
  Administered 2018-01-31: 81 mg via ORAL
  Filled 2018-01-30: qty 1

## 2018-01-30 MED ORDER — HEPARIN SODIUM (PORCINE) 5000 UNIT/ML IJ SOLN
4000.0000 [IU] | Freq: Once | INTRAMUSCULAR | Status: AC
  Administered 2018-01-30: 4000 [IU] via INTRAVENOUS

## 2018-01-30 MED ORDER — SODIUM CHLORIDE 0.9 % IV SOLN
INTRAVENOUS | Status: DC
  Administered 2018-01-30: 16:00:00 via INTRAVENOUS

## 2018-01-30 MED ORDER — AMLODIPINE BESYLATE 5 MG PO TABS
5.0000 mg | ORAL_TABLET | Freq: Every evening | ORAL | Status: DC
  Administered 2018-01-31: 5 mg via ORAL
  Filled 2018-01-30: qty 1

## 2018-01-30 MED ORDER — SODIUM CHLORIDE 0.9 % WEIGHT BASED INFUSION
3.0000 mL/kg/h | INTRAVENOUS | Status: DC
  Administered 2018-01-31: 3 mL/kg/h via INTRAVENOUS

## 2018-01-30 MED ORDER — ONDANSETRON HCL 4 MG/2ML IJ SOLN
4.0000 mg | Freq: Four times a day (QID) | INTRAMUSCULAR | Status: DC | PRN
Start: 2018-01-30 — End: 2018-01-31

## 2018-01-30 MED ORDER — HEPARIN (PORCINE) IN NACL 100-0.45 UNIT/ML-% IJ SOLN
14.0000 [IU]/kg/h | Freq: Once | INTRAMUSCULAR | Status: DC
  Filled 2018-01-30: qty 250

## 2018-01-30 MED ORDER — ASPIRIN 81 MG PO CHEW
81.0000 mg | CHEWABLE_TABLET | ORAL | Status: AC
  Administered 2018-01-31: 81 mg via ORAL
  Filled 2018-01-30: qty 1

## 2018-01-30 MED ORDER — SODIUM CHLORIDE 0.9% FLUSH
3.0000 mL | INTRAVENOUS | Status: DC | PRN
Start: 2018-01-30 — End: 2018-01-31

## 2018-01-30 MED ORDER — HEPARIN (PORCINE) IN NACL 100-0.45 UNIT/ML-% IJ SOLN
1200.0000 [IU]/h | INTRAMUSCULAR | Status: DC
  Administered 2018-01-30 – 2018-01-31 (×2): 1200 [IU]/h via INTRAVENOUS
  Filled 2018-01-30: qty 250

## 2018-01-30 MED ORDER — HEPARIN (PORCINE) IN NACL 100-0.45 UNIT/ML-% IJ SOLN: 14.0000 [IU]/kg/h | INTRAMUSCULAR | Status: DC

## 2018-01-30 MED ORDER — SODIUM CHLORIDE 0.9% FLUSH
3.0000 mL | Freq: Two times a day (BID) | INTRAVENOUS | Status: DC
  Administered 2018-01-31 (×2): 3 mL via INTRAVENOUS

## 2018-01-30 MED ORDER — NITROGLYCERIN 0.4 MG SL SUBL: 0.4000 mg | SUBLINGUAL_TABLET | SUBLINGUAL | Status: DC | PRN

## 2018-01-30 MED ORDER — ATORVASTATIN CALCIUM 20 MG PO TABS
20.0000 mg | ORAL_TABLET | Freq: Every evening | ORAL | Status: DC
  Administered 2018-01-31: 20 mg via ORAL
  Filled 2018-01-30: qty 1

## 2018-01-30 MED ORDER — SODIUM CHLORIDE 0.9 % WEIGHT BASED INFUSION
1.0000 mL/kg/h | INTRAVENOUS | Status: DC
  Administered 2018-01-31: 1 mL/kg/h via INTRAVENOUS

## 2018-01-30 MED ORDER — ACETAMINOPHEN 325 MG PO TABS: 650.0000 mg | ORAL_TABLET | ORAL | Status: DC | PRN

## 2018-01-30 NOTE — ED Notes (Signed)
Date and time results received: 01/30/18 4:39 PM  (use smartphrase ".now" to insert current time)  Test: troponin Critical Value: 10.83  Name of Provider Notified: Zakowski   Orders Received? Or Actions Taken?:

## 2018-01-30 NOTE — ED Provider Notes (Signed)
[facility] EMERGENCY DEPARTMENT Provider Note   CSN: [id number] Arrival date & time: 01/30/18  1520     History   Chief Complaint Chief Complaint  Patient presents with  . Dizziness    HPI Craig [name] is a 83 y.o. male.  Patient brought in by friend from primary care office and evening.  They had contact this before hand said that he had a heart rate in the 40s that seemed to be junctional asked if it appeared to be heart block and they said no.  Patient's stated that on Monday he started to have some chest discomfort and dizziness and some increased shortness of breath.  He has baseline shortness of breath but it was worse.  States the chest pain was not severe.  No syncope.  Patient is on a beta-blocker.  States that he is been taking it as prescribed.     Past Medical History:  Diagnosis Date  . Arthritis   . Coronary atherosclerosis of native coronary artery    Nonobstructive at catheterization 2006  . Essential hypertension, benign   . Mixed hyperlipidemia   . Permanent atrial fibrillation (HCC)    Declined anticoagulation with Dr. Andee LinemaneGent 06/2012    Patient Active Problem List   Diagnosis Date Noted  . NSTEMI (non-ST elevated myocardial infarction) (HCC) 01/30/2018  . Permanent atrial fibrillation (HCC) 11/08/2010  . HYPERLIPIDEMIA 11/07/2009  . Essential hypertension, benign 11/07/2009  . Coronary atherosclerosis of native coronary artery 11/07/2009    Past Surgical History:  Procedure Laterality Date  . JOINT REPLACEMENT     hip left  . LUMBAR LAMINECTOMY  07/21/2012   Procedure: MICRODISCECTOMY LUMBAR LAMINECTOMY;  Surgeon: Eldred MangesMark C Yates, MD;  [facility];  Service: Orthopedics;  Laterality: Left;  Left L5-S1 Foraminotomy, Microdiscectomy  . MICRODISCECTOMY LUMBAR  07/21/2012        Home Medications    Prior to Admission medications   Medication Sig Start Date End Date Taking? Authorizing Provider  allopurinol (ZYLOPRIM) 300 MG tablet Take 300  mg by mouth every evening.    Yes [provider]  amLODipine (NORVASC) 5 MG tablet Take 5 mg by mouth every evening.  04/28/11  Yes [provider]  atorvastatin (LIPITOR) 20 MG tablet Take 20 mg by mouth every evening.    Yes [provider]  doxazosin (CARDURA) 8 MG tablet Take 8 mg by mouth daily.  04/28/11  Yes [provider]  Emollient (DERMEND BRUISE FORMULA) CREA Apply 1 application topically as needed.   Yes [provider]  furosemide (LASIX) 20 MG tablet Take 20 mg by mouth daily.    Yes [provider]  gemfibrozil (LOPID) 600 MG tablet Take 600 mg by mouth 2 (two) times daily.    Yes [provider]  metoprolol succinate (TOPROL-XL) 50 MG 24 hr tablet TAKE 1 TABLET (50 MG)      EVERY MORNING AND 1/2      TABLET (25 MG) EVERY       EVENING Patient taking differently: TAKE 1 TABLET (50 MG) EVERY MORNING 08/07/12  Yes Serpe, Clide DeutscherEugene C, PA-C  sildenafil (REVATIO) 20 MG tablet Take 40-60 mg by mouth daily as needed. 12/05/17  Yes [provider]    Family History Family History  Problem Relation Age of Onset  . Diabetes Neg Hx   . Hypertension Neg Hx   . Coronary artery disease Neg Hx     Social History Social History   Tobacco Use  .  Smoking status: Former Smoker    Packs/day: 1.00    Years: 30.00    Pack years: 30.00    Types: Cigarettes    Last attempt to quit: 10/15/1982    Years since quitting: 35.3  . Smokeless tobacco: Never Used  Substance Use Topics  . Alcohol use: Yes    Alcohol/week: 8.4 oz    Types: 14 Shots of liquor per week    Comment: Occasional  . Drug use: No     Allergies   Patient has no known allergies.   Review of Systems Review of Systems  Constitutional: Negative for fever.  HENT: Negative for congestion.   Eyes: Negative for redness.  Respiratory: Positive for shortness of breath.   Cardiovascular: Positive for chest pain.  Gastrointestinal: Negative for abdominal  pain, nausea and vomiting.  Musculoskeletal: Negative for back pain.  Neurological: Positive for dizziness.  Psychiatric/Behavioral: Negative for confusion.     Physical Exam Updated Vital Signs BP (!) 126/98   Pulse (!) 41   Temp 97.6 F (36.4 C) (Oral)   Resp (!) 23   Ht 1.854 m (6\' 1" )   Wt 86.2 kg (190 lb)   SpO2 98%   BMI 25.07 kg/m   Physical Exam  Constitutional: He is oriented to person, place, and time. He appears well-developed and well-nourished. No distress.  HENT:  Head: Normocephalic and atraumatic.  Mouth/Throat: Oropharynx is clear and moist.  Eyes: Pupils are equal, round, and reactive to light. Conjunctivae and EOM are normal.  Neck: Normal range of motion.  Cardiovascular: Regular rhythm.  Significant bradycardia.  Pulmonary/Chest: Effort normal and breath sounds normal. No respiratory distress.  Abdominal: Soft. Bowel sounds are normal. There is no tenderness.  Musculoskeletal: Normal range of motion. He exhibits no edema.  Neurological: He is alert and oriented to person, place, and time. No cranial nerve deficit or sensory deficit. He exhibits normal muscle tone. Coordination normal.  Skin: Skin is warm.  Nursing note and vitals reviewed.    ED Treatments / Results  Labs (all labs ordered are listed, but only abnormal results are displayed) Labs Reviewed  COMPREHENSIVE METABOLIC PANEL - Abnormal; Notable for the following components:      Result Value   Glucose, Bld 113 (*)    BUN 33 (*)    Creatinine, Ser 1.76 (*)    AST 48 (*)    GFR calc non Af Amer 35 (*)    GFR calc Af Amer 40 (*)    All other components within normal limits  CBC WITH DIFFERENTIAL/PLATELET - Abnormal; Notable for the following components:   RBC 4.03 (*)    MCV 105.7 (*)    MCH 35.2 (*)    Platelets 143 (*)    All other components within normal limits  TROPONIN I - Abnormal; Notable for the following components:   Troponin I 10.83 (*)    All other components within  normal limits  APTT - Abnormal; Notable for the following components:   aPTT 98 (*)    All other components within normal limits  PROTIME-INR - Abnormal; Notable for the following components:   Prothrombin Time 15.3 (*)    All other components within normal limits  HEPARIN LEVEL (UNFRACTIONATED)  CBC    EKG EKG Interpretation  Date/Time:  Thursday January 30 2018 15:31:22 EDT Ventricular Rate:  42 PR Interval:    QRS Duration: 97 QT Interval:  490 QTC Calculation: 410 R Axis:   97 Text Interpretation:  Junctional  rhythm Right axis deviation Anteroseptal infarct, old Repol abnrm suggests ischemia, lateral leads Baseline wander in lead(s) V2 ? complete heart block Confirmed by Vanetta Mulders [phone]) on 01/30/2018 3:38:19 PM   Radiology Dg Chest Port 1 View  Result Date: 01/30/2018 CLINICAL DATA:  Bradycardia. EXAM: PORTABLE CHEST 1 VIEW COMPARISON:  Chest radiograph July 15, 2012 FINDINGS: Cardiac silhouette appears moderately enlarged. Mediastinal silhouette is nonsuspicious, calcified aortic knob. Similar chronic interstitial changes without pleural effusion or focal consolidation though the RIGHT costophrenic angle incompletely imaged. No pneumothorax. Soft tissue planes and included osseous structures are nonsuspicious. IMPRESSION: Stable cardiomegaly and chronic interstitial changes. Aortic Atherosclerosis (ICD10-I70.0). Electronically Signed   By: Awilda Metro M.D.   On: 01/30/2018 15:58    Procedures Procedures (including critical care time)  CRITICAL CARE Performed by: Vanetta Mulders Total critical care time: 30 minutes Critical care time was exclusive of separately billable procedures and treating other patients. Critical care was necessary to treat or prevent imminent or life-threatening deterioration. Critical care was time spent personally by me on the following activities: development of treatment plan with patient and/or surrogate as well as nursing,  discussions with consultants, evaluation of patient's response to treatment, examination of patient, obtaining history from patient or surrogate, ordering and performing treatments and interventions, ordering and review of laboratory studies, ordering and review of radiographic studies, pulse oximetry and re-evaluation of patient's condition.   Medications Ordered in ED Medications  0.9 %  sodium chloride infusion ( Intravenous New Bag/Given 01/30/18 1556)  heparin ADULT infusion 100 units/mL (25000 units/250mL sodium chloride 0.45%) (1,200 Units/hr Intravenous New Bag/Given 01/30/18 1657)  heparin injection 4,000 Units (4,000 Units Intravenous Bolus 01/30/18 1659)     Initial Impression / Assessment and Plan / ED Course  I have reviewed the triage vital signs and the nursing notes.  Pertinent labs & imaging results that were available during my care of the patient were reviewed by me and considered in my medical decision making (see chart for details).    EKG showed marked bradycardia probably heart block but not clear whether it secondary to the beta-blocker or a true complete heart block.  EKG reviewed by cardiology Dr. Kirtland Bouchard he felt that view a good chance that it could be related just to the beta-blocker.  Felt he was safe to stay here.  However that all changed when his troponin came back at 10 this made him more as a non-STEMI EKG office he had no acute ST changes.  Patient started on heparin.  Seen by Dr. Kirtland Bouchard plan is admission to [facility] pacer pads were placed but patient tolerating the low heart rate fine vital signs fine very stable.  Started on 2 L of oxygen.  Plan is be admission to [facility] [place] and Cath Lab most likely tomorrow.  Patient's beta-blocker will be held.  Admitting orders done by Dr. Kirtland Bouchard.   Final Clinical Impressions(s) / ED Diagnoses   Final diagnoses:  Heart block AV complete (HCC)  Non-STEMI (non-ST elevated myocardial infarction) [facility])    ED Discharge Orders    None         Vanetta Mulders, MD 01/30/18 1931

## 2018-01-30 NOTE — ED Triage Notes (Signed)
Dizziness for 4 days

## 2018-01-30 NOTE — H&P (Signed)
Cardiology Admission History and Physical:   Patient ID: Craig [name]; MRN: [id number]; DOB: 16-May-1935   Admission date: 01/30/2018  Primary Care Provider: Ignatius Specking, MD Primary Cardiologist: Dr. Diona Browner  Chief Complaint:  Dizziness  Patient Profile:   Craig [name] is a 83 y.o. male with a history of permanent atrial fibrillation who presents with dizziness.  History of Present Illness:   Craig [name] is an 83 year old male with a history of permanent atrial fibrillation and is followed by Dr. Diona Browner.  He began experiencing chest pain on the evening of 01/26/18 and contacted our office the following morning.  He described chest pain rated at 8/10 that radiated down his left and right arm and lasted several hours.  He took two 81 mg aspirin tablets.  He does not have any nitroglycerin.  He told our office staff that he did not have anyone to take him to the ER and he did not think it was a serious enough to call 911.  On the morning of 4/15, he denied chest pain and shortness of breath but still reported feeling unwell.  He then thought symptoms were related to indigestion and said he was not going to the ED.  Since that time he has been feeling dizzy.  He denies palpitations and syncope.  He feels generally fatigued.  When he came to the ED, his heart rate was in the low 40 bpm range.  The ED physician contacted me wondering if he was in heart block.  ECG showed slow atrial fibrillation.  Troponin returned at 10.83.    I reviewed the chest x-ray which shows stable cardiomegaly and chronic interstitial changes.  The patient currently denies chest pain and shortness of breath.  He does feel fatigued.  As per Dr. Ival Bible office note on 06/19/17, he has consistently declined anticoagulation and also reported difficulty taking even low-dose aspirin due to significant bruising.  I informed the patient that we would be starting IV heparin as he is having a myocardial infarction and he  agreed to this.  Of note, he has chronic ST-T wave abnormalities on prior ECGs.  His last coronary angiogram was in 2016 showed nonobstructive disease.  Most recent echocardiogram I find in the system is dated 06/16/15 and showed normal left ventricular systolic function, LVEF 55-60%, mild mitral and aortic regurgitation, mild to moderate left atrial enlargement, mild LVH, and indeterminate diastolic function.  He takes Toprol-XL 50 mg every morning and 25 mg every evening and this has been held.    Past Medical History:  Diagnosis Date  . Arthritis   . Coronary atherosclerosis of native coronary artery    Nonobstructive at catheterization 2006  . Essential hypertension, benign   . Mixed hyperlipidemia   . Permanent atrial fibrillation (HCC)    Declined anticoagulation with Dr. Andee Lineman 06/2012    Past Surgical History:  Procedure Laterality Date  . JOINT REPLACEMENT     hip left  . LUMBAR LAMINECTOMY  07/21/2012   Procedure: MICRODISCECTOMY LUMBAR LAMINECTOMY;  Surgeon: Eldred Manges, MD;  Location: [facility] OR;  Service: Orthopedics;  Laterality: Left;  Left L5-S1 Foraminotomy, Microdiscectomy  . MICRODISCECTOMY LUMBAR  07/21/2012     Medications Prior to Admission: Prior to Admission medications   Medication Sig Start Date End Date Taking? Authorizing Provider  allopurinol (ZYLOPRIM) 300 MG tablet Take 300 mg by mouth every evening.    Yes [provider]  amLODipine (NORVASC) 5 MG tablet Take 5 mg by mouth every  evening.  04/28/11  Yes [provider]  atorvastatin (LIPITOR) 20 MG tablet Take 20 mg by mouth every evening.    Yes [provider]  doxazosin (CARDURA) 8 MG tablet Take 8 mg by mouth daily.  04/28/11  Yes [provider]  Emollient (DERMEND BRUISE FORMULA) CREA Apply 1 application topically as needed.   Yes [provider]  furosemide (LASIX) 20 MG tablet Take 20 mg by mouth daily.    Yes [provider]  gemfibrozil  (LOPID) 600 MG tablet Take 600 mg by mouth 2 (two) times daily.    Yes [provider]  metoprolol succinate (TOPROL-XL) 50 MG 24 hr tablet TAKE 1 TABLET (50 MG)      EVERY MORNING AND 1/2      TABLET (25 MG) EVERY       EVENING Patient taking differently: TAKE 1 TABLET (50 MG) EVERY MORNING 08/07/12  Yes Serpe, Clide Deutscher, PA-C  sildenafil (REVATIO) 20 MG tablet Take 40-60 mg by mouth daily as needed. 12/05/17  Yes [provider]     Allergies:   No Known Allergies  Social History:   Social History   Socioeconomic History  . Marital status: Married    Spouse name: Not on file  . Number of children: Not on file  . Years of education: Not on file  . Highest education level: Not on file  Occupational History  . Not on file  Social Needs  . Financial resource strain: Not on file  . Food insecurity:    Worry: Not on file    Inability: Not on file  . Transportation needs:    Medical: Not on file    Non-medical: Not on file  Tobacco Use  . Smoking status: Former Smoker    Packs/day: 1.00    Years: 30.00    Pack years: 30.00    Types: Cigarettes    Last attempt to quit: 10/15/1982    Years since quitting: 35.3  . Smokeless tobacco: Never Used  Substance and Sexual Activity  . Alcohol use: Yes    Alcohol/week: 8.4 oz    Types: 14 Shots of liquor per week    Comment: Occasional  . Drug use: No  . Sexual activity: Not on file  Lifestyle  . Physical activity:    Days per week: Not on file    Minutes per session: Not on file  . Stress: Not on file  Relationships  . Social connections:    Talks on phone: Not on file    Gets together: Not on file    Attends religious service: Not on file    Active member of club or organization: Not on file    Attends meetings of clubs or organizations: Not on file    Relationship status: Not on file  . Intimate partner violence:    Fear of current or ex partner: Not on file    Emotionally abused: Not on file    Physically  abused: Not on file    Forced sexual activity: Not on file  Other Topics Concern  . Not on file  Social History Narrative  . Not on file    Family History:   The patient's family history is negative for Diabetes, Hypertension, and Coronary artery disease.    ROS:  Please see the history of present illness.  All other ROS reviewed and negative.     Physical Exam/Data:   Vitals:   01/30/18 1550 01/30/18 1600 01/30/18  1630 01/30/18 1700  BP:   115/78 110/66  Pulse: (!) 43 (!) 40 (!) 40 (!) 38  Resp: 18 14 14 19   Temp:      TempSrc:      SpO2: 95% 96% 98% 98%  Weight:      Height:       No intake or output data in the 24 hours ending 01/30/18 1705 Filed Weights   01/30/18 1524  Weight: 190 lb (86.2 kg)   Body mass index is 25.07 kg/m.  General:  Well nourished, well developed, in no acute distress HEENT: normal Lymph: no adenopathy Neck: no JVD Endocrine:  No thryomegaly Vascular: No carotid bruits Cardiac: Bradycardic, irregular rhythm, normal S1/S2, no S3, no murmurs rubs or gallops. Lungs:  clear to auscultation bilaterally, no wheezing, rhonchi or rales  Abd: soft, nontender, no hepatomegaly  Ext: no edema Musculoskeletal:  No deformities, BUE and BLE strength normal and equal Skin: warm and dry  Neuro:  CNs 2-12 intact, no focal abnormalities noted Psych:  Normal affect     Relevant CV Studies: Coronary angiography is being scheduled for tomorrow  Laboratory Data:  Chemistry Recent Labs  Lab 01/30/18 1536  NA 138  K 4.4  CL 104  CO2 22  GLUCOSE 113*  BUN 33*  CREATININE 1.76*  CALCIUM 10.1  GFRNONAA 35*  GFRAA 40*  ANIONGAP 12    Recent Labs  Lab 01/30/18 1536  PROT 6.9  ALBUMIN 3.5  AST 48*  ALT 21  ALKPHOS 79  BILITOT 0.9   Hematology Recent Labs  Lab 01/30/18 1536  WBC 8.6  RBC 4.03*  HGB 14.2  HCT 42.6  MCV 105.7*  MCH 35.2*  MCHC 33.3  RDW 14.3  PLT 143*   Cardiac Enzymes Recent Labs  Lab 01/30/18 1536    TROPONINI 10.83*   No results for input(s): TROPIPOC in the last 168 hours.  BNPNo results for input(s): BNP, PROBNP in the last 168 hours.  DDimer No results for input(s): DDIMER in the last 168 hours.  Radiology/Studies:  Dg Chest Port 1 View  Result Date: 01/30/2018 CLINICAL DATA:  Bradycardia. EXAM: PORTABLE CHEST 1 VIEW COMPARISON:  Chest radiograph July 15, 2012 FINDINGS: Cardiac silhouette appears moderately enlarged. Mediastinal silhouette is nonsuspicious, calcified aortic knob. Similar chronic interstitial changes without pleural effusion or focal consolidation though the RIGHT costophrenic angle incompletely imaged. No pneumothorax. Soft tissue planes and included osseous structures are nonsuspicious. IMPRESSION: Stable cardiomegaly and chronic interstitial changes. Aortic Atherosclerosis (ICD10-I70.0). Electronically Signed   By: Awilda Metro M.D.   On: 01/30/2018 15:58    Assessment and Plan:   1.  Non-STEMI: Although he is bradycardic, he is hemodynamically stable.  He currently denies chest pain or shortness of breath.  I informed him we will be starting IV heparin.  Beta-blockers will be held given his bradycardia and dizziness.  Coronary angiography has been arranged to be performed by Dr. Excell Seltzer tomorrow.  He has been treated with aspirin.  He will need high intensity statin therapy with Lipitor.  2.  Permanent atrial fibrillation: He has consistently refused anticoagulation.  He is currently being placed on IV heparin given his non-STEMI.  Given his bradycardia and dizziness, beta-blockers are being held.  3.  Hypertension: Blood pressure is controlled on amlodipine.  4.  Chronic kidney disease stage III: BUN 33, creatinine 1.76.  He is at a higher risk of contrast mediated nephropathy. Renal function will need continued monitoring.  5.  Mixed hyperlipidemia:  He is currently on Lipitor and gemfibrozil.  He will need high intensity statin therapy given his  non-STEMI.   Severity of Illness: The appropriate patient status for this patient is INPATIENT. Inpatient status is judged to be reasonable and necessary in order to provide the required intensity of service to ensure the patient's safety. The patient's presenting symptoms, physical exam findings, and initial radiographic and laboratory data in the context of their chronic comorbidities is felt to place them at high risk for further clinical deterioration. Furthermore, it is not anticipated that the patient will be medically stable for discharge from the hospital within 2 midnights of admission. The following factors support the patient status of inpatient.   " The patient's presenting symptoms include chest pain and dizziness (NSTEMI). " The worrisome physical exam findings include bradycardia. " The initial radiographic and laboratory data are worrisome because of elevated troponin/NSTEMI. " The chronic co-morbidities include atrial fibrillation.   * I certify that at the point of admission it is my clinical judgment that the patient will require inpatient hospital care spanning beyond 2 midnights from the point of admission due to high intensity of service, high risk for further deterioration and high frequency of surveillance required.*    For questions or updates, please contact [facility] Please consult [email or web address] for contact info under Cardiology/STEMI.  Time spent: 60 minutes    Signed, Prentice DockerSuresh Koneswaran, MD  01/30/2018 5:05 PM

## 2018-01-30 NOTE — Progress Notes (Signed)
ANTICOAGULATION CONSULT NOTE - Initial Consult  Pharmacy Consult for heparin Indication: STEMI/ACS  No Known Allergies  Patient Measurements: Height: 6\' 1"  (185.4 cm) Weight: 190 lb (86.2 kg) IBW/kg (Calculated) : 79.9 Heparin Dosing Weight: 86.2  Vital Signs: Temp: 97.6 F (36.4 C) (04/18 1524) Temp Source: Oral (04/18 1524) BP: 135/85 (04/18 1524) Pulse Rate: 40 (04/18 1600)  Labs: Recent Labs    01/30/18 1536  HGB 14.2  HCT 42.6  PLT 143*  CREATININE 1.76*  TROPONINI 10.83*    Estimated Creatinine Clearance: 37.8 mL/min (A) (by C-G formula based on SCr of 1.76 mg/dL (H)).   Medical History: Past Medical History:  Diagnosis Date  . Arthritis   . Coronary atherosclerosis of native coronary artery    Nonobstructive at catheterization 2006  . Essential hypertension, benign   . Mixed hyperlipidemia   . Permanent atrial fibrillation (HCC)    Declined anticoagulation with Dr. Andee LinemaneGent 06/2012    Assessment: Pharmacy consulted to dose heparin in patient with STEMI/ACS  Goal of Therapy:  Heparin level 0.3-0.7 units/ml Monitor platelets by anticoagulation protocol: Yes   Plan:  Give 4000 units bolus x 1 Start heparin infusion at 1200 units/hr Check anti-Xa level in 8 hours and daily while on heparin Continue to monitor H&H and platelets  Craig [name] 01/30/2018,5:01 PM

## 2018-01-31 ENCOUNTER — Inpatient Hospital Stay (HOSPITAL_COMMUNITY): Payer: Medicare Other

## 2018-01-31 ENCOUNTER — Encounter (HOSPITAL_COMMUNITY)
Admission: EM | Disposition: A | Discharge status: Home / Self Care | Payer: Self-pay | Source: Home / Self Care | Attending: Cardiovascular Disease

## 2018-01-31 DIAGNOSIS — N184 Chronic kidney disease, stage 4 (severe): Secondary | ICD-10-CM

## 2018-01-31 DIAGNOSIS — I442 Atrioventricular block, complete: Secondary | ICD-10-CM

## 2018-01-31 DIAGNOSIS — I2510 Atherosclerotic heart disease of native coronary artery without angina pectoris: Secondary | ICD-10-CM

## 2018-01-31 HISTORY — PX: ULTRASOUND GUIDANCE FOR VASCULAR ACCESS: SHX6516

## 2018-01-31 HISTORY — PX: LEFT HEART CATH AND CORONARY ANGIOGRAPHY: CATH118249

## 2018-01-31 LAB — LIPID PANEL
CHOL/HDL RATIO: 4 ratio
Cholesterol: 129 mg/dL (ref 0–200)
HDL: 32 mg/dL — AB (ref >40)
LDL CALC: 76 mg/dL (ref 0–99)
TRIGLYCERIDES: 103 mg/dL (ref <150)
VLDL: 21 mg/dL (ref 0–40)

## 2018-01-31 LAB — CBC
HCT: 40.5 % (ref 39.0–52.0)
Hemoglobin: 13.5 g/dL (ref 13.0–17.0)
MCH: 35.4 pg — ABNORMAL HIGH (ref 26.0–34.0)
MCHC: 33.3 g/dL (ref 30.0–36.0)
MCV: 106.3 fL — ABNORMAL HIGH (ref 78.0–100.0)
PLATELETS: 146 10*3/uL — AB (ref 150–400)
RBC: 3.81 MIL/uL — ABNORMAL LOW (ref 4.22–5.81)
RDW: 14.4 % (ref 11.5–15.5)
WBC: 8.7 10*3/uL (ref 4.0–10.5)

## 2018-01-31 LAB — BASIC METABOLIC PANEL
ANION GAP: 10 (ref 5–15)
BUN: 32 mg/dL — ABNORMAL HIGH (ref 6–20)
CHLORIDE: 106 mmol/L (ref 101–111)
CO2: 22 mmol/L (ref 22–32)
CREATININE: 1.69 mg/dL — AB (ref 0.61–1.24)
Calcium: 9.8 mg/dL (ref 8.9–10.3)
GFR calc non Af Amer: 37 mL/min — ABNORMAL LOW (ref >60)
GFR, EST AFRICAN AMERICAN: 42 mL/min — AB (ref >60)
Glucose, Bld: 113 mg/dL — ABNORMAL HIGH (ref 65–99)
POTASSIUM: 4 mmol/L (ref 3.5–5.1)
Sodium: 138 mmol/L (ref 135–145)

## 2018-01-31 LAB — HEPARIN LEVEL (UNFRACTIONATED): HEPARIN UNFRACTIONATED: 0.36 [IU]/mL (ref 0.30–0.70)

## 2018-01-31 LAB — TROPONIN I
TROPONIN I: 13.32 ng/mL — AB (ref <0.03)
Troponin I: 10.62 ng/mL (ref <0.03)
Troponin I: 9.7 ng/mL (ref <0.03)

## 2018-01-31 SURGERY — LEFT HEART CATH AND CORONARY ANGIOGRAPHY
Anesthesia: LOCAL

## 2018-01-31 MED ORDER — IOPAMIDOL (ISOVUE-370) INJECTION 76%
INTRAVENOUS | Status: AC
  Filled 2018-01-31: qty 100

## 2018-01-31 MED ORDER — MIDAZOLAM HCL 2 MG/2ML IJ SOLN
INTRAMUSCULAR | Status: DC | PRN
  Administered 2018-01-31: 1 mg via INTRAVENOUS

## 2018-01-31 MED ORDER — FENTANYL CITRATE (PF) 100 MCG/2ML IJ SOLN
INTRAMUSCULAR | Status: DC | PRN
  Administered 2018-01-31: 25 ug via INTRAVENOUS

## 2018-01-31 MED ORDER — SODIUM CHLORIDE 0.9 % IV SOLN
INTRAVENOUS | Status: AC
  Administered 2018-01-31: 18:00:00 via INTRAVENOUS

## 2018-01-31 MED ORDER — HEPARIN (PORCINE) IN NACL 2-0.9 UNITS/ML
INTRAMUSCULAR | Status: AC | PRN
  Administered 2018-01-31 (×2): 500 mL

## 2018-01-31 MED ORDER — ONDANSETRON HCL 4 MG/2ML IJ SOLN
4.0000 mg | Freq: Four times a day (QID) | INTRAMUSCULAR | Status: DC | PRN
Start: 2018-01-31 — End: 2018-02-04

## 2018-01-31 MED ORDER — ACETAMINOPHEN 325 MG PO TABS: 650.0000 mg | ORAL_TABLET | ORAL | Status: DC | PRN

## 2018-01-31 MED ORDER — HEPARIN SODIUM (PORCINE) 1000 UNIT/ML IJ SOLN
INTRAMUSCULAR | Status: AC
Start: 2018-01-31 — End: 2018-01-31
  Filled 2018-01-31: qty 1

## 2018-01-31 MED ORDER — VERAPAMIL HCL 2.5 MG/ML IV SOLN
INTRAVENOUS | Status: AC
Start: 2018-01-31 — End: 2018-01-31
  Filled 2018-01-31: qty 2

## 2018-01-31 MED ORDER — SODIUM CHLORIDE 0.9 % IV SOLN: 250.0000 mL | INTRAVENOUS | Status: DC | PRN

## 2018-01-31 MED ORDER — ATORVASTATIN CALCIUM 80 MG PO TABS: 80.0000 mg | ORAL_TABLET | Freq: Every evening | ORAL | Status: DC

## 2018-01-31 MED ORDER — FENTANYL CITRATE (PF) 100 MCG/2ML IJ SOLN
INTRAMUSCULAR | Status: AC
  Filled 2018-01-31: qty 2

## 2018-01-31 MED ORDER — IOPAMIDOL (ISOVUE-370) INJECTION 76%
INTRAVENOUS | Status: DC | PRN
  Administered 2018-01-31: 62 mL via INTRA_ARTERIAL

## 2018-01-31 MED ORDER — HEPARIN (PORCINE) IN NACL 1000-0.9 UT/500ML-% IV SOLN
INTRAVENOUS | Status: AC
  Filled 2018-01-31: qty 1000

## 2018-01-31 MED ORDER — SODIUM CHLORIDE 0.9% FLUSH: 3.0000 mL | INTRAVENOUS | Status: DC | PRN

## 2018-01-31 MED ORDER — LIDOCAINE HCL (PF) 1 % IJ SOLN
INTRAMUSCULAR | Status: DC | PRN
  Administered 2018-01-31: 2 mL

## 2018-01-31 MED ORDER — HEPARIN (PORCINE) IN NACL 100-0.45 UNIT/ML-% IJ SOLN
1200.0000 [IU]/h | INTRAMUSCULAR | Status: DC
  Administered 2018-01-31: 1200 [IU]/h via INTRAVENOUS

## 2018-01-31 MED ORDER — VERAPAMIL HCL 2.5 MG/ML IV SOLN
INTRAVENOUS | Status: DC | PRN
  Administered 2018-01-31: 10 mL via INTRA_ARTERIAL

## 2018-01-31 MED ORDER — SODIUM CHLORIDE 0.9% FLUSH
3.0000 mL | Freq: Two times a day (BID) | INTRAVENOUS | Status: DC
  Administered 2018-01-31 – 2018-02-04 (×8): 3 mL via INTRAVENOUS

## 2018-01-31 MED ORDER — MIDAZOLAM HCL 2 MG/2ML IJ SOLN
INTRAMUSCULAR | Status: AC
  Filled 2018-01-31: qty 2

## 2018-01-31 MED ORDER — HEPARIN SODIUM (PORCINE) 1000 UNIT/ML IJ SOLN
INTRAMUSCULAR | Status: DC | PRN
  Administered 2018-01-31: 4500 [IU] via INTRAVENOUS

## 2018-01-31 MED ORDER — LIDOCAINE HCL (PF) 1 % IJ SOLN
INTRAMUSCULAR | Status: AC
  Filled 2018-01-31: qty 30

## 2018-01-31 SURGICAL SUPPLY — 18 items
BAND CMPR LRG ZPHR (HEMOSTASIS) ×2
BAND ZEPHYR COMPRESS 30 LONG (HEMOSTASIS) ×1 IMPLANT
CATH INFINITI 5 FR JL3.5 (CATHETERS) ×1 IMPLANT
CATH INFINITI 5FR AL1 (CATHETERS) ×1 IMPLANT
CATH INFINITI JR4 5F (CATHETERS) ×1 IMPLANT
COVER PRB 48X5XTLSCP FOLD TPE (BAG) IMPLANT
COVER PROBE 5X48 (BAG) ×3
GUIDEWIRE INQWIRE 1.5J.035X260 (WIRE) IMPLANT
INQWIRE 1.5J .035X260CM (WIRE) ×3
KIT HEART LEFT (KITS) ×3 IMPLANT
NDL PERC 21GX4CM (NEEDLE) IMPLANT
NEEDLE PERC 21GX4CM (NEEDLE) ×3 IMPLANT
PACK CARDIAC CATHETERIZATION (CUSTOM PROCEDURE TRAY) ×3 IMPLANT
SHEATH RAIN RADIAL 21G 6FR (SHEATH) ×1 IMPLANT
TRANSDUCER W/STOPCOCK (MISCELLANEOUS) ×3 IMPLANT
TUBING CIL FLEX 10 FLL-RA (TUBING) ×3 IMPLANT
WIRE ASAHI PROWATER 180CM (WIRE) ×1 IMPLANT
WIRE HI TORQ VERSACORE-J 145CM (WIRE) ×1 IMPLANT

## 2018-01-31 NOTE — Progress Notes (Signed)
ANTICOAGULATION CONSULT NOTE - Follow Up Consult  Pharmacy Consult for Heparin Indication: atrial fibrillation  No Known Allergies  Patient Measurements: Height: 6\' 1"  (185.4 cm) Weight: 198 lb 14.4 oz (90.2 kg) IBW/kg (Calculated) : 79.9 Heparin Dosing Weight: 90.4 kg  Vital Signs: Temp: 98.3 F (36.8 C) (04/19 0548) Temp Source: Oral (04/19 0548) BP: 112/58 (04/19 0548) Pulse Rate: 38 (04/19 0548)  Labs: Recent Labs    01/30/18 1536 01/30/18 1800 01/30/18 2309 01/31/18 0507 01/31/18 0752  HGB 14.2  --   --   --   --   HCT 42.6  --   --   --   --   PLT 143*  --   --   --   --   APTT  --  98*  --   --   --   LABPROT  --  15.3*  --   --   --   INR  --  1.22  --   --   --   HEPARINUNFRC  --   --   --   --  0.36  CREATININE 1.76*  --   --  1.69*  --   TROPONINI 10.83*  --  13.32* 10.62*  --     Estimated Creatinine Clearance: 39.4 mL/min (A) (by C-G formula based on SCr of 1.69 mg/dL (H)).   Medications:  Scheduled:  . amLODipine  5 mg Oral QPM  . aspirin EC  81 mg Oral Daily  . atorvastatin  20 mg Oral QPM  . sodium chloride flush  3 mL Intravenous Q12H   Infusions:  . sodium chloride 50 mL/hr at 01/30/18 1556  . sodium chloride    . sodium chloride 1 mL/kg/hr (01/31/18 0725)  . heparin 1,200 Units/hr (01/30/18 1657)    Assessment: HL within goal range, no signs of bleeding  Goal of Therapy:  Heparin level 0.36 Goal 0.3 - 0.7 Monitor platelets by anticoagulation protocol: Yes   Plan:  ContinueStart heparin infusion at 1200 units/hr Check anti-Xa level in 8 hours and daily while on heparin Continue to monitor H&H and platelets  Tera Materatherine A Jose Alleyne 01/31/2018,8:56 AM

## 2018-01-31 NOTE — Interval H&P Note (Signed)
Cath Lab Visit (complete for each Cath Lab visit)  Clinical Evaluation Leading to the Procedure:   ACS: Yes.    Non-ACS:    Anginal Classification: CCS IV  Anti-ischemic medical therapy: Minimal Therapy (1 class of medications)  Non-Invasive Test Results: No non-invasive testing performed  Prior CABG: No previous CABG      History and Physical Interval Note:  01/31/2018 2:55 PM  Craig [name]  has presented today for surgery, with the diagnosis of Nstemi  The various methods of treatment have been discussed with the patient and family. After consideration of risks, benefits and other options for treatment, the patient has consented to  Procedure(s): LEFT HEART CATH AND CORONARY ANGIOGRAPHY (N/A) as a surgical intervention .  The patient's history has been reviewed, patient examined, no change in status, stable for surgery.  I have reviewed the patient's chart and labs.  Questions were answered to the patient's satisfaction.     Lance MussJayadeep Varanasi

## 2018-01-31 NOTE — H&P (View-Only) (Signed)
Progress Note  Patient Name: Craig [name] Date of Encounter: 01/31/2018  Primary Cardiologist: Dr. Diona Browner  Subjective   Pt feels well this AM. Denies chest pain, dizziness, or SOB. Long discussion about anticoagulation post-procedure given his   Inpatient Medications    Scheduled Meds: . amLODipine  5 mg Oral QPM  . aspirin EC  81 mg Oral Daily  . atorvastatin  20 mg Oral QPM  . sodium chloride flush  3 mL Intravenous Q12H   Continuous Infusions: . sodium chloride 50 mL/hr at 01/30/18 1556  . sodium chloride    . sodium chloride 1 mL/kg/hr (01/31/18 0725)  . heparin 1,200 Units/hr (01/30/18 1657)   PRN Meds: sodium chloride, acetaminophen, nitroGLYCERIN, ondansetron (ZOFRAN) IV, sodium chloride flush   Vital Signs    Vitals:   01/30/18 2100 01/30/18 2217 01/30/18 2353 01/31/18 0548  BP: (!) 112/58 115/65 118/61 (!) 112/58  Pulse: (!) 41 (!) 40 86 (!) 38  Resp: (!) 25 18 20 17   Temp:  97.9 F (36.6 C) 98.4 F (36.9 C) 98.3 F (36.8 C)  TempSrc:  Oral Oral Oral  SpO2: 98% 97% 97% 94%  Weight:  199 lb 3.2 oz (90.4 kg)  198 lb 14.4 oz (90.2 kg)  Height:  6\' 1"  (1.854 m)      Intake/Output Summary (Last 24 hours) at 01/31/2018 0910 Last data filed at 01/31/2018 0836 Gross per 24 hour  Intake 892.8 ml  Output 300 ml  Net 592.8 ml   Filed Weights   01/30/18 1524 01/30/18 2217 01/31/18 0548  Weight: 190 lb (86.2 kg) 199 lb 3.2 oz (90.4 kg) 198 lb 14.4 oz (90.2 kg)    Physical Exam   General: Well developed, well nourished, NAD Skin: Warm, dry, intact  Head: Normocephalic, atraumatic,  clear, moist mucus membranes. Neck: Negative for carotid bruits. No JVD Lungs:Clear to ausculation bilaterally. No wheezes, rales, or rhonchi. Breathing is unlabored. Cardiovascular: Irregularly irregular with S1 S2. No murmurs, rubs, or gallops Abdomen: Soft, non-tender, non-distended with normoactive bowel sounds.  No obvious abdominal masses. MSK: Strength and tone  appear normal for age. 5/5 in all extremities Extremities: No edema. No clubbing or cyanosis. DP/PT pulses 2+ bilaterally Neuro: Alert and oriented. No focal deficits. No facial asymmetry. MAE spontaneously. Psych: Responds to questions appropriately with normal affect.    Labs    Chemistry Recent Labs  Lab 01/30/18 1536 01/31/18 0507  NA 138 138  K 4.4 4.0  CL 104 106  CO2 22 22  GLUCOSE 113* 113*  BUN 33* 32*  CREATININE 1.76* 1.69*  CALCIUM 10.1 9.8  PROT 6.9  --   ALBUMIN 3.5  --   AST 48*  --   ALT 21  --   ALKPHOS 79  --   BILITOT 0.9  --   GFRNONAA 35* 37*  GFRAA 40* 42*  ANIONGAP 12 10     Hematology Recent Labs  Lab 01/30/18 1536  WBC 8.6  RBC 4.03*  HGB 14.2  HCT 42.6  MCV 105.7*  MCH 35.2*  MCHC 33.3  RDW 14.3  PLT 143*   Cardiac Enzymes Recent Labs  Lab 01/30/18 1536 01/30/18 2309 01/31/18 0507  TROPONINI 10.83* 13.32* 10.62*   No results for input(s): TROPIPOC in the last 168 hours.   BNPNo results for input(s): BNP, PROBNP in the last 168 hours.   DDimer No results for input(s): DDIMER in the last 168 hours.   Radiology    Dg  Chest Port 1 View  Result Date: 01/30/2018 CLINICAL DATA:  Bradycardia. EXAM: PORTABLE CHEST 1 VIEW COMPARISON:  Chest radiograph July 15, 2012 FINDINGS: Cardiac silhouette appears moderately enlarged. Mediastinal silhouette is nonsuspicious, calcified aortic knob. Similar chronic interstitial changes without pleural effusion or focal consolidation though the RIGHT costophrenic angle incompletely imaged. No pneumothorax. Soft tissue planes and included osseous structures are nonsuspicious. IMPRESSION: Stable cardiomegaly and chronic interstitial changes. Aortic Atherosclerosis (ICD10-I70.0). Electronically Signed   By: Awilda Metroourtnay  Bloomer M.D.   On: 01/30/2018 15:58   Telemetry    01/31/18 Atrial fibrillation HR 39- Personally Reviewed  ECG    01/30/18 Atrial fibrillation with HR 42 - Personally  Reviewed  Cardiac Studies   None   Patient Profile     83 y.o. male with a history of permanent atrial fibrillation who presents with dizziness.  Assessment & Plan    1.  Non-STEMI:  -Denies chest pain this AM -Trop, 10.83>13.32>10.62 -EKG with atrial fibrillation with HR 40's with no acute ST-T wave abnormalities. He is asymptomatic with this rate  -BB held secondary to bradycardia  -Plan for cardiac cath 01/31/18 given presenting symptoms and trop elevation  -ASA, Hep gtt -Will need to start high intensity statin   2.  Permanent atrial fibrillation:  -Pt has consistently refused anticoagulation per primary cardiology office note howvever long discussion today about post-cath anticoagulation given permanent Afib and stroke protection. He is willing to try Eliquis or Xarelto at this time. He was under the impression that he would need to be on Coumadin and was not interested in the diet and lab restrictions associated with that. He has a cleared understanding now. No hx of GI bleed or stroke -Currently on IV heparin given his non-STEMI.   -Given his bradycardia and dizziness, beta-blockers are being held -CHADSVASC score is 4  3.  Hypertension:  -Stable, 112/58>118/61>115/65  -Blood pressure is controlled on amlodipine  4.  Chronic kidney disease stage III:  -Creatinine 1.69 this AM -Baseline from 2012 in the 0.85-1.0 range -He is at a higher risk of contrast mediated nephropathy>>MD okay with proceeding with cath  -Echo ordered this AM given no LV gram during cath today  -Close post-procedure monitoring   5. Dyslipidemia:  -On Lipitor and gemfibrozil   -Will increase dose to high intensity therapy given his non-STEMI   Signed, Craig ChardJill McDaniel NP-C [facility] Pager: [phone] 01/31/2018, 9:10 AM     For questions or updates, please contact   Please consult [email or web address] for contact info under Cardiology/STEMI.  The patient was seen, examined and discussed with  Craig ChardJill McDaniel, NP  and I agree with the above.   The patient is asymptomatic this am, complaining of back pain. Troponin peaked at 13.32 -> 10.62 --> 9.70, on heparin drip, BP stable, Telemetry  shows escape junctional bradycardia with HR 40 and intermittent escape ventricular rhythm and nsVTs, the patient is asymptomatic and has pacer pads and a transcutaneous pacer attached, currently off.  Awaiting cath today, Crea 1.69, Dr Excell Seltzerooper ok with it, we will obtain an echocardiogram to avoid extra use of contrast with LV gram.  On ASA, atorvastatin, unable to use BB with bradycardia and ACEI/ARB with CKD stage 4.  Craig AlexanderKatarina Iyanni Hepp, MD 01/31/2018

## 2018-01-31 NOTE — Progress Notes (Addendum)
Progress Note  Patient Name: Craig [name] Date of Encounter: 01/31/2018  Primary Cardiologist: Dr. Diona Browner  Subjective   Pt feels well this AM. Denies chest pain, dizziness, or SOB. Long discussion about anticoagulation post-procedure given his   Inpatient Medications    Scheduled Meds: . amLODipine  5 mg Oral QPM  . aspirin EC  81 mg Oral Daily  . atorvastatin  20 mg Oral QPM  . sodium chloride flush  3 mL Intravenous Q12H   Continuous Infusions: . sodium chloride 50 mL/hr at 01/30/18 1556  . sodium chloride    . sodium chloride 1 mL/kg/hr (01/31/18 0725)  . heparin 1,200 Units/hr (01/30/18 1657)   PRN Meds: sodium chloride, acetaminophen, nitroGLYCERIN, ondansetron (ZOFRAN) IV, sodium chloride flush   Vital Signs    Vitals:   01/30/18 2100 01/30/18 2217 01/30/18 2353 01/31/18 0548  BP: (!) 112/58 115/65 118/61 (!) 112/58  Pulse: (!) 41 (!) 40 86 (!) 38  Resp: (!) 25 18 20 17   Temp:  97.9 F (36.6 C) 98.4 F (36.9 C) 98.3 F (36.8 C)  TempSrc:  Oral Oral Oral  SpO2: 98% 97% 97% 94%  Weight:  199 lb 3.2 oz (90.4 kg)  198 lb 14.4 oz (90.2 kg)  Height:  6\' 1"  (1.854 m)      Intake/Output Summary (Last 24 hours) at 01/31/2018 0910 Last data filed at 01/31/2018 0836 Gross per 24 hour  Intake 892.8 ml  Output 300 ml  Net 592.8 ml   Filed Weights   01/30/18 1524 01/30/18 2217 01/31/18 0548  Weight: 190 lb (86.2 kg) 199 lb 3.2 oz (90.4 kg) 198 lb 14.4 oz (90.2 kg)    Physical Exam   General: Well developed, well nourished, NAD Skin: Warm, dry, intact  Head: Normocephalic, atraumatic,  clear, moist mucus membranes. Neck: Negative for carotid bruits. No JVD Lungs:Clear to ausculation bilaterally. No wheezes, rales, or rhonchi. Breathing is unlabored. Cardiovascular: Irregularly irregular with S1 S2. No murmurs, rubs, or gallops Abdomen: Soft, non-tender, non-distended with normoactive bowel sounds.  No obvious abdominal masses. MSK: Strength and tone  appear normal for age. 5/5 in all extremities Extremities: No edema. No clubbing or cyanosis. DP/PT pulses 2+ bilaterally Neuro: Alert and oriented. No focal deficits. No facial asymmetry. MAE spontaneously. Psych: Responds to questions appropriately with normal affect.    Labs    Chemistry Recent Labs  Lab 01/30/18 1536 01/31/18 0507  NA 138 138  K 4.4 4.0  CL 104 106  CO2 22 22  GLUCOSE 113* 113*  BUN 33* 32*  CREATININE 1.76* 1.69*  CALCIUM 10.1 9.8  PROT 6.9  --   ALBUMIN 3.5  --   AST 48*  --   ALT 21  --   ALKPHOS 79  --   BILITOT 0.9  --   GFRNONAA 35* 37*  GFRAA 40* 42*  ANIONGAP 12 10     Hematology Recent Labs  Lab 01/30/18 1536  WBC 8.6  RBC 4.03*  HGB 14.2  HCT 42.6  MCV 105.7*  MCH 35.2*  MCHC 33.3  RDW 14.3  PLT 143*   Cardiac Enzymes Recent Labs  Lab 01/30/18 1536 01/30/18 2309 01/31/18 0507  TROPONINI 10.83* 13.32* 10.62*   No results for input(s): TROPIPOC in the last 168 hours.   BNPNo results for input(s): BNP, PROBNP in the last 168 hours.   DDimer No results for input(s): DDIMER in the last 168 hours.   Radiology    Dg  Chest Port 1 View  Result Date: 01/30/2018 CLINICAL DATA:  Bradycardia. EXAM: PORTABLE CHEST 1 VIEW COMPARISON:  Chest radiograph July 15, 2012 FINDINGS: Cardiac silhouette appears moderately enlarged. Mediastinal silhouette is nonsuspicious, calcified aortic knob. Similar chronic interstitial changes without pleural effusion or focal consolidation though the RIGHT costophrenic angle incompletely imaged. No pneumothorax. Soft tissue planes and included osseous structures are nonsuspicious. IMPRESSION: Stable cardiomegaly and chronic interstitial changes. Aortic Atherosclerosis (ICD10-I70.0). Electronically Signed   By: Awilda Metroourtnay  Bloomer M.D.   On: 01/30/2018 15:58   Telemetry    01/31/18 Atrial fibrillation HR 39- Personally Reviewed  ECG    01/30/18 Atrial fibrillation with HR 42 - Personally  Reviewed  Cardiac Studies   None   Patient Profile     83 y.o. male with a history of permanent atrial fibrillation who presents with dizziness.  Assessment & Plan    1.  Non-STEMI:  -Denies chest pain this AM -Trop, 10.83>13.32>10.62 -EKG with atrial fibrillation with HR 40's with no acute ST-T wave abnormalities. He is asymptomatic with this rate  -BB held secondary to bradycardia  -Plan for cardiac cath 01/31/18 given presenting symptoms and trop elevation  -ASA, Hep gtt -Will need to start high intensity statin   2.  Permanent atrial fibrillation:  -Pt has consistently refused anticoagulation per primary cardiology office note howvever long discussion today about post-cath anticoagulation given permanent Afib and stroke protection. He is willing to try Eliquis or Xarelto at this time. He was under the impression that he would need to be on Coumadin and was not interested in the diet and lab restrictions associated with that. He has a cleared understanding now. No hx of GI bleed or stroke -Currently on IV heparin given his non-STEMI.   -Given his bradycardia and dizziness, beta-blockers are being held -CHADSVASC score is 4  3.  Hypertension:  -Stable, 112/58>118/61>115/65  -Blood pressure is controlled on amlodipine  4.  Chronic kidney disease stage III:  -Creatinine 1.69 this AM -Baseline from 2012 in the 0.85-1.0 range -He is at a higher risk of contrast mediated nephropathy>>MD okay with proceeding with cath  -Echo ordered this AM given no LV gram during cath today  -Close post-procedure monitoring   5. Dyslipidemia:  -On Lipitor and gemfibrozil   -Will increase dose to high intensity therapy given his non-STEMI   Signed, Georgie ChardJill McDaniel NP-C [facility] Pager: [phone] 01/31/2018, 9:10 AM     For questions or updates, please contact   Please consult [email or web address] for contact info under Cardiology/STEMI.  The patient was seen, examined and discussed with  Georgie ChardJill McDaniel, NP  and I agree with the above.   The patient is asymptomatic this am, complaining of back pain. Troponin peaked at 13.32 -> 10.62 --> 9.70, on heparin drip, BP stable, Telemetry  shows escape junctional bradycardia with HR 40 and intermittent escape ventricular rhythm and nsVTs, the patient is asymptomatic and has pacer pads and a transcutaneous pacer attached, currently off.  Awaiting cath today, Crea 1.69, Dr Excell Seltzerooper ok with it, we will obtain an echocardiogram to avoid extra use of contrast with LV gram.  On ASA, atorvastatin, unable to use BB with bradycardia and ACEI/ARB with CKD stage 4.  Tobias AlexanderKatarina Kyler Lerette, MD 01/31/2018

## 2018-01-31 NOTE — Progress Notes (Signed)
ANTICOAGULATION CONSULT NOTE - Follow Up Consult  Pharmacy Consult for Heparin Indication: atrial fibrillation  No Known Allergies  Patient Measurements: Height: 6\' 1"  (185.4 cm) Weight: 198 lb 14.4 oz (90.2 kg) IBW/kg (Calculated) : 79.9 Heparin Dosing Weight: 90.4 kg  Vital Signs: Temp: 98.2 F (36.8 C) (04/19 1333) Temp Source: Oral (04/19 1333) BP: 126/60 (04/19 1540) Pulse Rate: 40 (04/19 1540)  Labs: Recent Labs    01/30/18 1536 01/30/18 1800 01/30/18 2309 01/31/18 0507 01/31/18 0752 01/31/18 1049  HGB 14.2  --   --   --   --  13.5  HCT 42.6  --   --   --   --  40.5  PLT 143*  --   --   --   --  146*  APTT  --  98*  --   --   --   --   LABPROT  --  15.3*  --   --   --   --   INR  --  1.22  --   --   --   --   HEPARINUNFRC  --   --   --   --  0.36  --   CREATININE 1.76*  --   --  1.69*  --   --   TROPONINI 10.83*  --  13.32* 10.62*  --  9.70*    Estimated Creatinine Clearance: 39.4 mL/min (A) (by C-G formula based on SCr of 1.69 mg/dL (H)).   Medications:  Scheduled:  . sodium chloride flush  3 mL Intravenous Q12H   Infusions:  . sodium chloride    . sodium chloride    . heparin      Assessment: Heparin to resume 6 hours post sheath removal at 2300 pm  Goal of Therapy:  Goal 0.3 - 0.7 Monitor platelets by anticoagulation protocol: Yes   Plan:  Heparin at 1200 units / hr 8 hour heparin level Daily heparin level, CBC  Thank you Okey RegalLisa Mariaclara Spear, PharmD [phone] 01/31/2018,4:29 PM

## 2018-02-01 ENCOUNTER — Inpatient Hospital Stay (HOSPITAL_COMMUNITY): Payer: Medicare Other

## 2018-02-01 DIAGNOSIS — I351 Nonrheumatic aortic (valve) insufficiency: Secondary | ICD-10-CM

## 2018-02-01 LAB — CBC
HCT: 40.4 % (ref 39.0–52.0)
HEMOGLOBIN: 13.4 g/dL (ref 13.0–17.0)
MCH: 35.1 pg — AB (ref 26.0–34.0)
MCHC: 33.2 g/dL (ref 30.0–36.0)
MCV: 105.8 fL — ABNORMAL HIGH (ref 78.0–100.0)
Platelets: 158 10*3/uL (ref 150–400)
RBC: 3.82 MIL/uL — AB (ref 4.22–5.81)
RDW: 14.5 % (ref 11.5–15.5)
WBC: 10.1 10*3/uL (ref 4.0–10.5)

## 2018-02-01 LAB — HEPARIN LEVEL (UNFRACTIONATED): HEPARIN UNFRACTIONATED: 0.11 [IU]/mL — AB (ref 0.30–0.70)

## 2018-02-01 LAB — ECHOCARDIOGRAM COMPLETE
Height: 73 in
WEIGHTICAEL: 3196.8 [oz_av]

## 2018-02-01 LAB — MAGNESIUM: MAGNESIUM: 2.1 mg/dL (ref 1.7–2.4)

## 2018-02-01 MED ORDER — ATORVASTATIN CALCIUM 20 MG PO TABS
20.0000 mg | ORAL_TABLET | Freq: Every day | ORAL | Status: DC
  Administered 2018-02-01 – 2018-02-03 (×3): 20 mg via ORAL
  Filled 2018-02-01 (×3): qty 1

## 2018-02-01 MED ORDER — APIXABAN 2.5 MG PO TABS
2.5000 mg | ORAL_TABLET | Freq: Two times a day (BID) | ORAL | Status: DC
  Administered 2018-02-01 – 2018-02-02 (×4): 2.5 mg via ORAL
  Filled 2018-02-01 (×4): qty 1

## 2018-02-01 NOTE — Progress Notes (Signed)
Progress Note  Patient Name: Craig [name] Date of Encounter: 02/01/2018  Primary Cardiologist: Dr. Jonelle Sidle  Subjective   Sitting in bedside chair, having breakfast.  No chest pain or shortness of breath.  They said he did not sleep well.  Nursing note indicates that he had some confusion this morning, but seems clear now.  Inpatient Medications    Scheduled Meds: . sodium chloride flush  3 mL Intravenous Q12H   Continuous Infusions: . sodium chloride    . heparin 1,200 Units/hr (01/31/18 2320)   PRN Meds: sodium chloride, acetaminophen, ondansetron (ZOFRAN) IV, sodium chloride flush   Vital Signs    Vitals:   01/31/18 1620 01/31/18 1635 01/31/18 2054 02/01/18 0426  BP: (!) 127/54 (!) 124/53 123/68 (!) 126/59  Pulse: (!) 42 (!) 40 (!) 45 (!) 43  Resp: 18 19 19    Temp:   98.2 F (36.8 C) 97.9 F (36.6 C)  TempSrc:   Oral Oral  SpO2: 90% (!) 89% (!) 88% 95%  Weight:    199 lb 12.8 oz (90.6 kg)  Height:        Intake/Output Summary (Last 24 hours) at 02/01/2018 0948 Last data filed at 02/01/2018 1610 Gross per 24 hour  Intake 1420.28 ml  Output 300 ml  Net 1120.28 ml   Filed Weights   01/30/18 2217 01/31/18 0548 02/01/18 0426  Weight: 199 lb 3.2 oz (90.4 kg) 198 lb 14.4 oz (90.2 kg) 199 lb 12.8 oz (90.6 kg)    Telemetry    Apparent junctional rhythm.  Personally reviewed.  ECG    Tracing from 01/31/2018 showed a probable junctional rhythm with underlying atrial fibrillation and diffuse nonspecific ST-T wave abnormalities.  Personally reviewed.  Physical Exam   GEN:  Elderly male.  No acute distress.   Neck: No JVD. Cardiac: RRR, no gallop.  Respiratory: Nonlabored. Clear to auscultation bilaterally. GI: Soft, nontender, bowel sounds present. MS: No edema; No deformity. Neuro:  Nonfocal. Psych: Alert and oriented x 3. Normal affect.  Labs    Chemistry Recent Labs  Lab 01/30/18 1536 01/31/18 0507  NA 138 138  K 4.4 4.0  CL 104 106    CO2 22 22  GLUCOSE 113* 113*  BUN 33* 32*  CREATININE 1.76* 1.69*  CALCIUM 10.1 9.8  PROT 6.9  --   ALBUMIN 3.5  --   AST 48*  --   ALT 21  --   ALKPHOS 79  --   BILITOT 0.9  --   GFRNONAA 35* 37*  GFRAA 40* 42*  ANIONGAP 12 10     Hematology Recent Labs  Lab 01/30/18 1536 01/31/18 1049 02/01/18 0651  WBC 8.6 8.7 10.1  RBC 4.03* 3.81* 3.82*  HGB 14.2 13.5 13.4  HCT 42.6 40.5 40.4  MCV 105.7* 106.3* 105.8*  MCH 35.2* 35.4* 35.1*  MCHC 33.3 33.3 33.2  RDW 14.3 14.4 14.5  PLT 143* 146* 158    Cardiac Enzymes Recent Labs  Lab 01/30/18 1536 01/30/18 2309 01/31/18 0507 01/31/18 1049  TROPONINI 10.83* 13.32* 10.62* 9.70*   No results for input(s): TROPIPOC in the last 168 hours.    Radiology    Dg Chest Port 1 View  Result Date: 01/30/2018 CLINICAL DATA:  Bradycardia. EXAM: PORTABLE CHEST 1 VIEW COMPARISON:  Chest radiograph July 15, 2012 FINDINGS: Cardiac silhouette appears moderately enlarged. Mediastinal silhouette is nonsuspicious, calcified aortic knob. Similar chronic interstitial changes without pleural effusion or focal consolidation though the RIGHT costophrenic angle incompletely  imaged. No pneumothorax. Soft tissue planes and included osseous structures are nonsuspicious. IMPRESSION: Stable cardiomegaly and chronic interstitial changes. Aortic Atherosclerosis (ICD10-I70.0). Electronically Signed   By: Awilda Metroourtnay  Bloomer M.D.   On: 01/30/2018 15:58    Cardiac Studies   Cardiac catheterization 01/31/2018:  Mid Cx lesion is 25% stenosed.  Mid LAD lesion is 25% stenosed.  Prox LAD lesion is 25% stenosed.  Prox RCA lesion is 100% stenosed. Large dominant vessel. There are left to right collaterals. Culprit lesion.  There is no aortic valve stenosis.  LV end diastolic pressure is mildly elevated.   Medical therapy.  RCA occlusion occurred on Monday based on symptoms.  No angina in over 24 hours.  Occlusion looks organized and there would be high risk  of distal embolization attempt at intervention since the vessel is likely been occluded for 4 days.  He may be having some heart rate issues due to the occluded RCA.  Hopefully, these will resolve.    Restart heparin in 6 hours. If the patient had further anginal symptoms in the future, could consider PCI at a later time.    Patient Profile     83 y.o. male with chronic atrial fibrillation, hypertension, CKD stage III, and hyperlipidemia.  He presents with NSTEMI, troponin I up to 13.32.  Cardiac catheterization shows occluded RCA with left-to-right collaterals and plan is medical therapy at this point.  Assessment & Plan    1.  NSTEMI with peak troponin I 13.32.  No recurrent chest pain.  Culprit lesion felt to be occluded RCA although there are left-to-right collaterals and plan is for medical therapy at this point as noted in the cardiac catheterization report.  2.  Chronic atrial fibrillation.  Currently bradycardic with intermittent junctional rhythm, possibly associated with occluded RCA.  Beta-blocker has been held.  CHADSVASC score is 4.  He has consistently declined anticoagulation as an outpatient although is in agreement to considering DOAC at this time (this has actually been discussed with him previously as well).   3.  Mixed hyperlipidemia, on Lipitor.  4.  CKD stage 3, creatinine 1.69.  5.  Essential hypertension, on Norvasc as an outpatient.  Reviewed chart, current medical regimen, and discussed rationale for anticoagulation again with the patient today to clarify his viewpoint.  He is in agreement to DOAC at this time and we will start Eliquis, stop heparin.  Do not plan to start Plavix at this time, particularly until we are able to see how he does on Eliquis first.  Reinitiate Lipitor, hold Norvasc pending blood pressure trend.  Echocardiogram is pending.  Increase activity today, consider discharge possible  tomorrow.  Signed, Nona DellSamuel Keaden Gunnoe, MD  02/01/2018, 9:48 AM

## 2018-02-01 NOTE — Progress Notes (Signed)
  Echocardiogram 2D Echocardiogram has been performed.  Craig [name] 02/01/2018, 12:38 PM

## 2018-02-01 NOTE — Progress Notes (Signed)
Patient noted with increased confusion and anxiety throughout the night, he did not sleep. He is anxiously waiting for the Dr to discharge him.  Reorientation was unsuccessful.  Will continue to monitor.

## 2018-02-01 NOTE — Evaluation (Signed)
Physical Therapy Evaluation Patient Details Name: Craig [name] MRN: [id number] DOB: 18-Dec-1934 Today's Date: 02/01/2018   History of Present Illness  Pt is an 83 y/o male admitted secondary to persistent dizziness and found to have a NSTEMI. PMH including but not limited to a-fib and HTN.    Clinical Impression  Pt presented supine in bed with HOB elevated, awake and willing to participate in therapy session. Prior to admission, pt reported that he was independent with all functional mobility and ADLs. Pt currently able to perform bed mobility with min A, transfers with min guard and ambulated in hallway without use of an AD with min guard for safety. Pt with initial bout of v-tach with HR into the 140's upon standing, but very brief (otherwise HR in mid 40's). Pt asymptomatic and RN aware. PT will continue to follow pt acutely to progress mobility as tolerated and to ensure a safe d/c home.    Follow Up Recommendations No PT follow up    Equipment Recommendations  None recommended by PT    Recommendations for Other Services       Precautions / Restrictions Precautions Precautions: Fall Restrictions Weight Bearing Restrictions: No      Mobility  Bed Mobility Overal bed mobility: Needs Assistance Bed Mobility: Supine to Sit     Supine to sit: Min assist     General bed mobility comments: increased time and effort, min A to elevate trunk  Transfers Overall transfer level: Needs assistance Equipment used: None Transfers: Sit to/from Stand Sit to Stand: Min guard         General transfer comment: min guard for safety; pt with initial bout of v-tach with HR into the 140's briefly (HR in mid 40's otherwise)  Ambulation/Gait Ambulation/Gait assistance: Min guard Ambulation Distance (Feet): 200 Feet Assistive device: None Gait Pattern/deviations: Step-through pattern;Decreased stride length Gait velocity: decreased Gait velocity interpretation: <1.31 ft/sec, indicative  of household ambulator General Gait Details: mild instability but no overt LOB or need for physical assistance, min guard for safety without use of an AD  Stairs            Wheelchair Mobility    Modified Rankin (Stroke Patients Only)       Balance Overall balance assessment: Needs assistance Sitting-balance support: Feet supported Sitting balance-Leahy Scale: Good     Standing balance support: No upper extremity supported;During functional activity Standing balance-Leahy Scale: Good                               Pertinent Vitals/Pain Pain Assessment: No/denies pain    Home Living Family/patient expects to be discharged to:: Private residence Living Arrangements: Alone Available Help at Discharge: Family;Available PRN/intermittently Type of Home: House Home Access: Level entry     Home Layout: One level Home Equipment: None      Prior Function Level of Independence: Independent               Hand Dominance        Extremity/Trunk Assessment   Upper Extremity Assessment Upper Extremity Assessment: Overall WFL for tasks assessed    Lower Extremity Assessment Lower Extremity Assessment: Overall WFL for tasks assessed       Communication   Communication: No difficulties  Cognition Arousal/Alertness: Awake/alert Behavior During Therapy: WFL for tasks assessed/performed Overall Cognitive Status: Within Functional Limits for tasks assessed  General Comments      Exercises     Assessment/Plan    PT Assessment Patient needs continued PT services  PT Problem List Decreased balance;Decreased mobility;Decreased coordination       PT Treatment Interventions DME instruction;Gait training;Stair training;Functional mobility training;Therapeutic activities;Therapeutic exercise;Balance training;Neuromuscular re-education;Patient/family education    PT Goals (Current goals can be  found in the Care Plan section)  Acute Rehab PT Goals Patient Stated Goal: return home PT Goal Formulation: With patient Time For Goal Achievement: 02/15/18 Potential to Achieve Goals: Good    Frequency Min 3X/week   Barriers to discharge        Co-evaluation               AM-PAC PT "6 Clicks" Daily Activity  Outcome Measure Difficulty turning over in bed (including adjusting bedclothes, sheets and blankets)?: A Little Difficulty moving from lying on back to sitting on the side of the bed? : Unable Difficulty sitting down on and standing up from a chair with arms (e.g., wheelchair, bedside commode, etc,.)?: A Little Help needed moving to and from a bed to chair (including a wheelchair)?: None Help needed walking in hospital room?: None Help needed climbing 3-5 steps with a railing? : A Little 6 Click Score: 18    End of Session Equipment Utilized During Treatment: Gait belt Activity Tolerance: Patient tolerated treatment well Patient left: in chair;with call bell/phone within reach;with chair alarm set;with family/visitor present Nurse Communication: Mobility status PT Visit Diagnosis: Other abnormalities of gait and mobility (R26.89)    Time: 9147-8295 PT Time Calculation (min) (ACUTE ONLY): 11 min   Charges:   PT Evaluation $PT Eval Moderate Complexity: 1 Mod     PT G Codes:        Craig [name], PT, DPT [phone]   Craig [name] 02/01/2018, 5:19 PM

## 2018-02-02 LAB — BASIC METABOLIC PANEL
Anion gap: 10 (ref 5–15)
BUN: 30 mg/dL — ABNORMAL HIGH (ref 6–20)
CALCIUM: 9.6 mg/dL (ref 8.9–10.3)
CHLORIDE: 107 mmol/L (ref 101–111)
CO2: 21 mmol/L — ABNORMAL LOW (ref 22–32)
CREATININE: 1.39 mg/dL — AB (ref 0.61–1.24)
GFR calc non Af Amer: 46 mL/min — ABNORMAL LOW (ref >60)
GFR, EST AFRICAN AMERICAN: 54 mL/min — AB (ref >60)
Glucose, Bld: 105 mg/dL — ABNORMAL HIGH (ref 65–99)
Potassium: 3.6 mmol/L (ref 3.5–5.1)
SODIUM: 138 mmol/L (ref 135–145)

## 2018-02-02 LAB — CBC
HCT: 38.2 % — ABNORMAL LOW (ref 39.0–52.0)
HEMOGLOBIN: 12.9 g/dL — AB (ref 13.0–17.0)
MCH: 35.5 pg — ABNORMAL HIGH (ref 26.0–34.0)
MCHC: 33.8 g/dL (ref 30.0–36.0)
MCV: 105.2 fL — ABNORMAL HIGH (ref 78.0–100.0)
Platelets: 157 10*3/uL (ref 150–400)
RBC: 3.63 MIL/uL — ABNORMAL LOW (ref 4.22–5.81)
RDW: 14.5 % (ref 11.5–15.5)
WBC: 8.3 10*3/uL (ref 4.0–10.5)

## 2018-02-02 MED ORDER — MAGNESIUM HYDROXIDE 400 MG/5ML PO SUSP
30.0000 mL | Freq: Every day | ORAL | Status: DC | PRN
  Administered 2018-02-02 – 2018-02-03 (×2): 30 mL via ORAL
  Filled 2018-02-02 (×2): qty 30

## 2018-02-02 NOTE — Plan of Care (Signed)
Cardiac Monitor. Ambulates with assistance. Compliant with calling for needs oob etc.

## 2018-02-02 NOTE — Progress Notes (Signed)
C/O Constipation. States he has not had BM since Thursday. MOM ordered per Cardiology prn.

## 2018-02-02 NOTE — Progress Notes (Signed)
Progress Note  Patient Name: Craig [name] Date of Encounter: 02/02/2018  Primary Cardiologist: Dr. Jonelle SidleSamuel G. McDowell  Subjective   States that he slept well.  No chest pain or shortness of breath at rest.  Did well with PT yesterday.  He has had no dizziness or syncope.  Inpatient Medications    Scheduled Meds: . apixaban  2.5 mg Oral BID  . atorvastatin  20 mg Oral q1800  . sodium chloride flush  3 mL Intravenous Q12H   Continuous Infusions: . sodium chloride     PRN Meds: sodium chloride, acetaminophen, ondansetron (ZOFRAN) IV, sodium chloride flush   Vital Signs    Vitals:   02/01/18 1356 02/01/18 2024 02/02/18 0451 02/02/18 0455  BP: 135/61 134/68  (!) 144/49  Pulse: (!) 43 (!) 46  (!) 48  Resp: 18     Temp: 97.9 F (36.6 C) 98.3 F (36.8 C)  98.2 F (36.8 C)  TempSrc: Oral Oral  Oral  SpO2: 90% 93%  90%  Weight:   199 lb 6.4 oz (90.4 kg)   Height:        Intake/Output Summary (Last 24 hours) at 02/02/2018 0919 Last data filed at 02/01/2018 2025 Gross per 24 hour  Intake -  Output 150 ml  Net -150 ml   Filed Weights   01/31/18 0548 02/01/18 0426 02/02/18 0451  Weight: 198 lb 14.4 oz (90.2 kg) 199 lb 12.8 oz (90.6 kg) 199 lb 6.4 oz (90.4 kg)    Telemetry    Slow atrial fibrillation.  Also 20 beat burst of NSVT.  Personally reviewed.  ECG    Tracing from 02/02/2018 shows slow atrial fibrillation with nonspecific ST-T changes.  Personally reviewed.  Physical Exam   GEN: Elderly male. No acute distress.   Neck: No JVD. Cardiac:  Irregularly irregular, no gallop.  Respiratory: Nonlabored. Clear to auscultation bilaterally. GI: Soft, nontender, bowel sounds present. MS: No edema; No deformity. Neuro:  Nonfocal. Psych: Alert and oriented x 3. Normal affect.  Labs    Chemistry Recent Labs  Lab 01/30/18 1536 01/31/18 0507 02/02/18 0456  NA 138 138 138  K 4.4 4.0 3.6  CL 104 106 107  CO2 22 22 21*  GLUCOSE 113* 113* 105*  BUN 33* 32*  30*  CREATININE 1.76* 1.69* 1.39*  CALCIUM 10.1 9.8 9.6  PROT 6.9  --   --   ALBUMIN 3.5  --   --   AST 48*  --   --   ALT 21  --   --   ALKPHOS 79  --   --   BILITOT 0.9  --   --   GFRNONAA 35* 37* 46*  GFRAA 40* 42* 54*  ANIONGAP 12 10 10      Hematology Recent Labs  Lab 01/31/18 1049 02/01/18 0651 02/02/18 0456  WBC 8.7 10.1 8.3  RBC 3.81* 3.82* 3.63*  HGB 13.5 13.4 12.9*  HCT 40.5 40.4 38.2*  MCV 106.3* 105.8* 105.2*  MCH 35.4* 35.1* 35.5*  MCHC 33.3 33.2 33.8  RDW 14.4 14.5 14.5  PLT 146* 158 157    Cardiac Enzymes Recent Labs  Lab 01/30/18 1536 01/30/18 2309 01/31/18 0507 01/31/18 1049  TROPONINI 10.83* 13.32* 10.62* 9.70*   No results for input(s): TROPIPOC in the last 168 hours.    Radiology    No results found.  Cardiac Studies   Cardiac catheterization 01/31/2018:  Mid Cx lesion is 25% stenosed.  Mid LAD lesion is 25% stenosed.  Prox LAD lesion is 25% stenosed.  Prox RCA lesion is 100% stenosed. Large dominant vessel. There are left to right collaterals. Culprit lesion.  There is no aortic valve stenosis.  LV end diastolic pressure is mildly elevated.  Medical therapy. RCA occlusion occurred on Monday based on symptoms. No angina in over 24 hours. Occlusion looks organized and there would be high risk of distal embolization attempt at intervention since the vessel is likely been occluded for 4 days. He may be having some heart rate issues due to the occluded RCA. Hopefully, these will resolve.   Restart heparin in 6 hours. If the patient had further anginal symptoms in the future, could consider PCI at a later time.   Echocardiogram 02/01/2018: Study Conclusions  - Left ventricle: Focal thinning and hypokinesis of the mid septum.   The cavity size was normal. There was mild focal basal   hypertrophy of the septum. Systolic function was normal. The   estimated ejection fraction was in the range of 60% to 65%. The   study is not  technically sufficient to allow evaluation of LV   diastolic function. - Aortic valve: There was mild regurgitation. - Mitral valve: There was mild regurgitation. - Left atrium: The atrium was moderately dilated. - Atrial septum: There was increased thickness of the septum,   consistent with lipomatous hypertrophy. No defect or patent   foramen ovale was identified. - Pulmonary arteries: PA peak pressure: 44 mm Hg (S).  Patient Profile     83 y.o. male with chronic atrial fibrillation, hypertension, CKD stage III, and hyperlipidemia.  He presents with NSTEMI, troponin I up to 13.32.  Cardiac catheterization shows occluded RCA with left-to-right collaterals and plan is medical therapy at this point.  Assessment & Plan    1.  NSTEMI with peak troponin I of 13.32.  He reports no recurrent chest pain.  Cardiac catheterization shows occluded RCA with left-to-right collaterals and plan for medical therapy at this time.  2.  Chronic atrial fibrillation.  CHADSVASC score is 4, he has agreed to anticoagulation with Eliquis which was started yesterday.  He remains bradycardic, possibly associated with occluded RCA.  Beta-blocker has been stopped.  3.  NSVT.  LVEF normal at 60-65% by echocardiogram.  4.  CKD stage 3, creatinine down to 1.39.  5.  Hyperlipidemia, on Lipitor.  6.  Essential hypertension, Norvasc has been held so far.  Systolic blood pressure 1 3140s.  Watch another day on telemetry, still concerned about bradycardia but he seems to be tolerating this well.  Did have burst of NSVT, but would not resume beta-blocker.  Be able to resume low-dose Norvasc depending on blood pressure trend.  Continue with Eliquis.  Anticipate discharge tomorrow if otherwise stable.  Signed, Nona Dell, MD  02/02/2018, 9:19 AM

## 2018-02-03 ENCOUNTER — Other Ambulatory Visit: Payer: Self-pay | Admitting: Cardiology

## 2018-02-03 ENCOUNTER — Encounter (HOSPITAL_COMMUNITY): Payer: Self-pay | Admitting: Interventional Cardiology

## 2018-02-03 DIAGNOSIS — Z7901 Long term (current) use of anticoagulants: Secondary | ICD-10-CM

## 2018-02-03 DIAGNOSIS — Z5181 Encounter for therapeutic drug level monitoring: Secondary | ICD-10-CM

## 2018-02-03 DIAGNOSIS — I495 Sick sinus syndrome: Secondary | ICD-10-CM

## 2018-02-03 DIAGNOSIS — D7589 Other specified diseases of blood and blood-forming organs: Secondary | ICD-10-CM

## 2018-02-03 DIAGNOSIS — R001 Bradycardia, unspecified: Secondary | ICD-10-CM

## 2018-02-03 LAB — CBC
HCT: 39 % (ref 39.0–52.0)
Hemoglobin: 13.1 g/dL (ref 13.0–17.0)
MCH: 35.5 pg — ABNORMAL HIGH (ref 26.0–34.0)
MCHC: 33.6 g/dL (ref 30.0–36.0)
MCV: 105.7 fL — ABNORMAL HIGH (ref 78.0–100.0)
Platelets: 170 10*3/uL (ref 150–400)
RBC: 3.69 MIL/uL — ABNORMAL LOW (ref 4.22–5.81)
RDW: 14.6 % (ref 11.5–15.5)
WBC: 7.7 10*3/uL (ref 4.0–10.5)

## 2018-02-03 LAB — BASIC METABOLIC PANEL
Anion gap: 10 (ref 5–15)
BUN: 23 mg/dL — AB (ref 6–20)
CALCIUM: 9.5 mg/dL (ref 8.9–10.3)
CHLORIDE: 107 mmol/L (ref 101–111)
CO2: 21 mmol/L — ABNORMAL LOW (ref 22–32)
CREATININE: 1.22 mg/dL (ref 0.61–1.24)
GFR, EST NON AFRICAN AMERICAN: 54 mL/min — AB (ref >60)
Glucose, Bld: 113 mg/dL — ABNORMAL HIGH (ref 65–99)
Potassium: 3.1 mmol/L — ABNORMAL LOW (ref 3.5–5.1)
SODIUM: 138 mmol/L (ref 135–145)

## 2018-02-03 LAB — FOLATE: Folate: 8.1 ng/mL (ref >5.9)

## 2018-02-03 LAB — VITAMIN B12: Vitamin B-12: 1887 pg/mL — ABNORMAL HIGH (ref 180–914)

## 2018-02-03 MED ORDER — APIXABAN 5 MG PO TABS
5.0000 mg | ORAL_TABLET | Freq: Two times a day (BID) | ORAL | Status: DC
  Administered 2018-02-03 – 2018-02-04 (×3): 5 mg via ORAL
  Filled 2018-02-03 (×3): qty 1

## 2018-02-03 MED ORDER — AMLODIPINE BESYLATE 5 MG PO TABS
5.0000 mg | ORAL_TABLET | Freq: Every day | ORAL | Status: DC
  Administered 2018-02-03 – 2018-02-04 (×2): 5 mg via ORAL
  Filled 2018-02-03 (×2): qty 1

## 2018-02-03 MED FILL — Heparin Sod (Porcine)-NaCl IV Soln 1000 Unit/500ML-0.9%: INTRAVENOUS | Qty: 1000 | Status: AC

## 2018-02-03 NOTE — Progress Notes (Signed)
Notified by CCMD that patient is having frequent pauses with longest being 2.19.  Patient is in bed with eyes closed. Will continue to monitor.

## 2018-02-03 NOTE — Progress Notes (Addendum)
Progress Note  Patient Name: Craig [name] Date of Encounter: 02/03/2018  Primary Cardiologist: Dr. Diona Browner  Subjective   Pt feeling well this AM. Per tele review, having frequent pauses and slow atrial fibrillation despite no BB therapy since 01/30/18. Denies chest pain, palpitations. Has some dizziness from lying to sitting position for 3-4 seconds>>then return to normal.   Inpatient Medications    Scheduled Meds: . apixaban  2.5 mg Oral BID  . atorvastatin  20 mg Oral q1800  . sodium chloride flush  3 mL Intravenous Q12H   Continuous Infusions: . sodium chloride     PRN Meds: sodium chloride, acetaminophen, magnesium hydroxide, ondansetron (ZOFRAN) IV, sodium chloride flush   Vital Signs    Vitals:   02/02/18 0455 02/02/18 1439 02/02/18 2017 02/03/18 0413  BP: (!) 144/49 (!) 135/54 139/82 125/70  Pulse: (!) 48 (!) 43 (!) 50 (!) 47  Resp:   18 15  Temp: 98.2 F (36.8 C) 98 F (36.7 C) 98.1 F (36.7 C) 98 F (36.7 C)  TempSrc: Oral Oral Oral Oral  SpO2: 90% 93% 91% 93%  Weight:    198 lb 12.8 oz (90.2 kg)  Height:        Intake/Output Summary (Last 24 hours) at 02/03/2018 0734 Last data filed at 02/03/2018 0424 Gross per 24 hour  Intake 840 ml  Output 300 ml  Net 540 ml   Filed Weights   02/01/18 0426 02/02/18 0451 02/03/18 0413  Weight: 199 lb 12.8 oz (90.6 kg) 199 lb 6.4 oz (90.4 kg) 198 lb 12.8 oz (90.2 kg)    Physical Exam   General: Well developed, well nourished, NAD Skin: Warm, dry, intact  Head: Normocephalic, atraumatic, sclera non-icteric, no xanthomas, clear, moist mucus membranes. Neck: Negative for carotid bruits. No JVD Lungs: Crackles in bilateral bases. No wheezes, rales, or rhonchi. Breathing is unlabored. Cardiovascular: Irregularly irregular with S1 S2. No murmurs, rubs, gallops, or LV heave appreciated. Abdomen: Soft, non-tender, non-distended with normoactive bowel sounds. No hepatomegaly, No rebound/guarding. No obvious  abdominal masses. MSK: Strength and tone appear normal for age. 5/5 in all extremities Extremities: No edema. No clubbing or cyanosis. DP/PT pulses 2+ bilaterally Neuro: Alert and oriented. No focal deficits. No facial asymmetry. MAE spontaneously. Psych: Responds to questions appropriately with normal affect.    Labs    Chemistry Recent Labs  Lab 01/30/18 1536 01/31/18 0507 02/02/18 0456  NA 138 138 138  K 4.4 4.0 3.6  CL 104 106 107  CO2 22 22 21*  GLUCOSE 113* 113* 105*  BUN 33* 32* 30*  CREATININE 1.76* 1.69* 1.39*  CALCIUM 10.1 9.8 9.6  PROT 6.9  --   --   ALBUMIN 3.5  --   --   AST 48*  --   --   ALT 21  --   --   ALKPHOS 79  --   --   BILITOT 0.9  --   --   GFRNONAA 35* 37* 46*  GFRAA 40* 42* 54*  ANIONGAP 12 10 10      Hematology Recent Labs  Lab 02/01/18 0651 02/02/18 0456 02/03/18 0528  WBC 10.1 8.3 7.7  RBC 3.82* 3.63* 3.69*  HGB 13.4 12.9* 13.1  HCT 40.4 38.2* 39.0  MCV 105.8* 105.2* 105.7*  MCH 35.1* 35.5* 35.5*  MCHC 33.2 33.8 33.6  RDW 14.5 14.5 14.6  PLT 158 157 170    Cardiac Enzymes Recent Labs  Lab 01/30/18 1536 01/30/18 2309 01/31/18 0507  01/31/18 1049  TROPONINI 10.83* 13.32* 10.62* 9.70*   No results for input(s): TROPIPOC in the last 168 hours.   BNPNo results for input(s): BNP, PROBNP in the last 168 hours.   DDimer No results for input(s): DDIMER in the last 168 hours.   Radiology    No results found.  Telemetry    02/03/18 atrial fibrillation with slow rate and frequent pauses overnight - Personally Reviewed  ECG    No new tracings as of 02/03/18 - Personally Reviewed  Cardiac Studies   Cardiaccatheterization 01/31/2018:  Mid Cx lesion is 25% stenosed.  Mid LAD lesion is 25% stenosed.  Prox LAD lesion is 25% stenosed.  Prox RCA lesion is 100% stenosed. Large dominant vessel. There are left to right collaterals. Culprit lesion.  There is no aortic valve stenosis.  LV end diastolic pressure is mildly  elevated.  Medical therapy. RCA occlusion occurred on Monday based on symptoms. No angina in over 24 hours. Occlusion looks organized and there would be high risk of distal embolization attempt at intervention since the vessel is likely been occluded for 4 days. He may be having some heart rate issues due to the occluded RCA. Hopefully, these will resolve.   Restart heparin in 6 hours. If the patient had further anginal symptoms in the future, could consider PCI at a later time.  Echocardiogram 02/01/2018: Study Conclusions  - Left ventricle: Focal thinning and hypokinesis of the mid septum. The cavity size was normal. There was mild focal basal hypertrophy of the septum. Systolic function was normal. The estimated ejection fraction was in the range of 60% to 65%. The study is not technically sufficient to allow evaluation of LV diastolic function. - Aortic valve: There was mild regurgitation. - Mitral valve: There was mild regurgitation. - Left atrium: The atrium was moderately dilated. - Atrial septum: There was increased thickness of the septum, consistent with lipomatous hypertrophy. No defect or patent foramen ovale was identified. - Pulmonary arteries: PA peak pressure: 44 mm Hg (S).  Patient Profile     83 y.o. male with chronic atrial fibrillation, hypertension, CKD stage III, and hyperlipidemia. He presents with NSTEMI, troponin I up to 13.32. Cardiac catheterization shows occluded RCA with left-to-right collaterals and plan is medical therapy at this point.  Assessment & Plan    1.  NSTEMI:  -Denies chest pain this AM -Trop, 10.83>13.32>10.62 -EKG with atrial fibrillation with HR 40's with no acute ST-T wave abnormalities. He is asymptomatic with this rate  -BB held secondary to bradycardia>>>per tele review, pt having frequent pauses -Cardiac cath 01/31/18 with occluded RCA with left-to-right collaterals and plan for medical therapy at this  time -ASA, statin   2.  Chronic atrial fibrillation: -Pt has consistently refused anticoagulation per primary cardiology office note however long discussion about post-cath anticoagulation given permanent Afib and stroke protection. He is willing to try Eliquis at this time. He was under the impression that he would need to be on Coumadin and was not interested in the diet and lab restrictions associated with that. He has a cleared understanding now. No hx of GI bleed or stroke -Pt agreeable to anticoagulation, started 02/01/18 -Remains bradycardiac with frequent pauses and is asymptomatic with this>>possibly associated with RCA occlusion?  -Possible 30-day monitor at discharge or EP consult for PPM given that he has not had any blocking agents since admission on 01/30/18 -No BB at this time secondary to persistent bradycardia  3.  NSVT: -Burst of NSVT on tele>>>however given  bradycardiac and pauses>>would not resume BB at this time  -LVEF normal at 60-65% by echocardiogram.  4.  Acute on chronic kidney disease stage 3: -Creatinine, 1.22 today, down from 1.39 on 02/02/18 -Baseline appears to be in the 1.0 range -Avoid nephrotoxic medications>>dowtrending post-cath on 01/31/18  5.  Hyperlipidemia: -Stable, CHO 129, LDL-76, HDL-32, Trig 103 on 01/31/18 -Continue Lipitor   6.  Essential hypertension: -Stable, 125/70>139/82>135/54 -Consider adding Norvasc 5mg  PO back to regimen    Signed, Georgie Chard NP-C [facility] Pager: [phone] 02/03/2018, 7:34 AM      Patient seen and examined. Agree with assessment and plan. Feels well; no chest pain. Will add low dose amlodipine for BP and CAD with total RCA. Troponin peak 13.32.  AF rate now in the 70 - 80 range; but had pauses last evening. He was on Toprol XL 75 mg PTA which may still be on board.    Cr improved to 1.39 yesterday and 1.22  today and now < 1.5;  If remains <1.5 consistently, need to increase eliquis to 5 mg bid. Avoid  BB for now.  Will ambulate today; re-assess for DC later today but  probably tomorrow. Check B12/folate with MCV 105. Agree with outpatient monitor.   Lennette Bihari, MD, [facility] 02/03/2018 8:57 AM    For questions or updates, please contact   Please consult [email or web address] for contact info under Cardiology/STEMI.

## 2018-02-03 NOTE — Progress Notes (Signed)
#  1.  S/W JERAMY   @ [employer] [facility] (PDP )  # [phone]     ELIQUIS 5 MG BID  COVER- YES  CO-PAY- $ 425.38  TIER- 3 DRUG  PRIOR APPROVAL- NO   DEDUCTIBLE- NOT MET   PREFERRED PHARMACY : [facility] AND [facility]   PT WILL HAVE 20% CO-INSURANCE FOR RX MED ONCE DEDUCTIBLE MET.    Pt given 30 day free trial card for Eliquis for first Rx.    Reinaldo Raddle, RN, BSN  Trauma/Neuro ICU Case Manager [phone]

## 2018-02-04 DIAGNOSIS — I481 Persistent atrial fibrillation: Secondary | ICD-10-CM

## 2018-02-04 DIAGNOSIS — E785 Hyperlipidemia, unspecified: Secondary | ICD-10-CM

## 2018-02-04 DIAGNOSIS — I4891 Unspecified atrial fibrillation: Secondary | ICD-10-CM

## 2018-02-04 LAB — CBC
HEMATOCRIT: 38.6 % — AB (ref 39.0–52.0)
HEMOGLOBIN: 12.8 g/dL — AB (ref 13.0–17.0)
MCH: 34.7 pg — ABNORMAL HIGH (ref 26.0–34.0)
MCHC: 33.2 g/dL (ref 30.0–36.0)
MCV: 104.6 fL — AB (ref 78.0–100.0)
Platelets: 190 10*3/uL (ref 150–400)
RBC: 3.69 MIL/uL — AB (ref 4.22–5.81)
RDW: 14.1 % (ref 11.5–15.5)
WBC: 7.1 10*3/uL (ref 4.0–10.5)

## 2018-02-04 MED ORDER — ASPIRIN 81 MG PO TBEC: 81.0000 mg | DELAYED_RELEASE_TABLET | Freq: Every day | ORAL | Status: AC

## 2018-02-04 MED ORDER — ASPIRIN EC 81 MG PO TBEC
81.0000 mg | DELAYED_RELEASE_TABLET | Freq: Every day | ORAL | Status: DC
  Administered 2018-02-04: 81 mg via ORAL
  Filled 2018-02-04: qty 1

## 2018-02-04 MED ORDER — APIXABAN 5 MG PO TABS: 5.0000 mg | ORAL_TABLET | Freq: Two times a day (BID) | ORAL | 3 refills | Status: AC

## 2018-02-04 NOTE — Discharge Summary (Addendum)
Discharge Summary    Patient ID: Craig [name],  MRN: [id number], DOB/AGE: July 07, 1935 83 y.o.  Admit date: 01/30/2018 Discharge date: 02/04/2018  Primary Care Provider: Ignatius [name] Primary Cardiologist: Craig Dell, MD  Discharge Diagnoses    Principal Problem:   Non-STEMI (non-ST elevated myocardial infarction) [facility]) Active Problems:   Essential hypertension, benign   Coronary atherosclerosis of native coronary artery   Permanent atrial fibrillation [facility])   Atrial fibrillation with slow ventricular response (HCC)   Dyslipidemia  Allergies No Known Allergies  Diagnostic Studies/Procedures    Cardiaccatheterization 01/31/2018:  Mid Cx lesion is 25% stenosed.  Mid LAD lesion is 25% stenosed.  Prox LAD lesion is 25% stenosed.  Prox RCA lesion is 100% stenosed. Large dominant vessel. There are left to right collaterals. Culprit lesion.  There is no aortic valve stenosis.  LV end diastolic pressure is mildly elevated.  Medical therapy. RCA occlusion occurred on Monday based on symptoms. No angina in over 24 hours. Occlusion looks organized and there would be high risk of distal embolization attempt at intervention since the vessel is likely been occluded for 4 days. He may be having some heart rate issues due to the occluded RCA. Hopefully, these will resolve.   Restart heparin in 6 hours. If the patient had further anginal symptoms in the future, could consider PCI at a later time.  Echocardiogram 02/01/2018: Study Conclusions  - Left ventricle: Focal thinning and hypokinesis of the mid septum. The cavity size was normal. There was mild focal basal hypertrophy of the septum. Systolic function was normal. The estimated ejection fraction was in the range of 60% to 65%. The study is not technically sufficient to allow evaluation of LV diastolic function. - Aortic valve: There was mild regurgitation. - Mitral valve: There was mild  regurgitation. - Left atrium: The atrium was moderately dilated. - Atrial septum: There was increased thickness of the septum, consistent with lipomatous hypertrophy. No defect or patent foramen ovale was identified. - Pulmonary arteries: PA peak pressure: 44 mm Hg (S).  History of Present Illness     Mr. Craig [name] is an 83 year old male with a history of permanent atrial fibrillation not on anticoagulation until this hospital admission, nonobstructive CAD per cath 2006, HTN, CKD stage III and HLD. He is followed by Dr. Diona [name].  Mr. Craig [name] began experiencing chest pain on the evening of 01/26/18 and contacted our office the following morning. He described chest pain rated at 8/10 that radiated down his left and right arm and lasted several hours. He took two 81 mg ASA tablets. He told our office staff that he did not have anyone to take him to the ER and he did not think it was a serious enough to call 911. On the morning of 01/27/18, he denied chest pain and shortness of breath but still reported feeling "unwell".  He then thought symptoms were related to indigestion and said he was not going to the ED. Since that time, he has been feeling dizzy however denied palpitations and syncope.    Hospital Course     On ED presentation, the patients heart rate was found to be in the low 40 bpm range. The ED physician contacted Cardiology wondering if he was in heart block. ECG showed slow atrial fibrillation but no acute ischemic abnormalities. Of note, he has chronic ST-T wave abnormalities on prior ECGs. His troponin level returned at 10.83 with a peak of 13.32  although he continued to deny chest  pain. His major presenting complaint is fatigue. His chest x-ray showed stable cardiomegaly and chronic interstitial changes. Given his elevated trop level, the pt was placed on IV hep gtt in anticipation of a cardiac cath.   On 01/31/18 the pt was taken to the cardiac cath lab which revealed a total RCA  occlusion with left to right collaterals. With no angina in over 24 hours and an occlusion that looks organized,  there would be high risk of distal embolization attempt at intervention since the vessel is likely been occluded for 4 days. He may be having some heart rate (bradycardia with pauses) issues due to the occluded RCA. Per cath note, if the patient had further anginal symptoms in the future, could consider PCI at a later time. Cath site unremarkable at day of discharge. No s/s of hematoma or bleeding    Other issues during his hospital stay include persistent bradycardia (40-50's) with frequent pauses (most during times of sleep). Pt has been completely asymptomatic with both rate and pauses. His beta blocker was held on admission and he states that his last dose was on Thursday, 01/30/18. The plan is to set him up with a 30-day monitor and watch his telemetry with close follow up post hospitalization. We will not start back his BB at this time. I have placed the monitor order and will set him up for a fitting appointment and follow up appointment in 3-4 weeks with Dr. Diona [name] or APP.   Given that the patient has declined anticoagulation in the past, Per Dr. Ival [name] office note on 06/19/17, the pt has consistently declined anticoagulation and also reported difficulty taking even low-dose aspirin due to significant bruising, however after a long discussion about post-cath anticoagulation given permanent Afib and stroke protection, he has been agreeable to Eliquis 5mg  PO BID. Pt was under the impression that he would need to be on Coumadin and was not interested in the diet and lab restrictions associated with that. He has a cleared understanding now. No hx of GI bleed or stroke  An echocardiogram was also performed which showed mild hypokinesis of the mid septum and an LVEF of 60% to 65%.   His last coronary angiogram was in 2016 showed nonobstructive disease.  Most recent echocardiogram I find  in the system is dated 06/16/15 and showed normal left ventricular systolic function, LVEF 55-60%, mild mitral and aortic regurgitation, mild to moderate left atrial enlargement, mild LVH, and indeterminate diastolic function.  Consultants: None   The pt was seen and examined by Dr. Tresa Endo who feels that he is stable and ready for discharge today, 02/04/18 _____________  Discharge Vitals Blood pressure 127/74, pulse 77 , temperature 98.5 F (36.9 C), temperature source Oral, resp. rate (!) 23, height 6\' 1"  (1.854 m), weight 200 lb (90.7 kg), SpO2 94.  Filed Weights   02/02/18 0451 02/03/18 0413 02/04/18 0446  Weight: 199 lb 6.4 oz (90.4 kg) 198 lb 12.8 oz (90.2 kg) 200 lb (90.7 kg)    Labs & Radiologic Studies    CBC Recent Labs    02/03/18 0528 02/04/18 0339  WBC 7.7 7.1  HGB 13.1 12.8*  HCT 39.0 38.6*  MCV 105.7* 104.6*  PLT 170 190   Basic Metabolic Panel Recent Labs    09/81/19 1548 02/02/18 0456 02/03/18 0528  NA  --  138 138  K  --  3.6 3.1*  CL  --  107 107  CO2  --  21* 21*  GLUCOSE  --  105* 113*  BUN  --  30* 23*  CREATININE  --  1.39* 1.22  CALCIUM  --  9.6 9.5  MG 2.1  --   --    _____________  Dg Chest Port 1 View  Result Date: 01/30/2018 CLINICAL DATA:  Bradycardia. EXAM: PORTABLE CHEST 1 VIEW COMPARISON:  Chest radiograph July 15, 2012 FINDINGS: Cardiac silhouette appears moderately enlarged. Mediastinal silhouette is nonsuspicious, calcified aortic knob. Similar chronic interstitial changes without pleural effusion or focal consolidation though the RIGHT costophrenic angle incompletely imaged. No pneumothorax. Soft tissue planes and included osseous structures are nonsuspicious. IMPRESSION: Stable cardiomegaly and chronic interstitial changes. Aortic Atherosclerosis (ICD10-I70.0). Electronically Signed   By: Awilda Metroourtnay  Bloomer M.D.   On: 01/30/2018 15:58   Disposition   Pt is being discharged home today in good condition.  Follow-up Plans &  Appointments   Follow-up Information    [facility] [address] [facility] Follow up on 02/05/2018.   Specialty:  Cardiology Why:  Your monitor placement appointment [facility] be on 02/05/[facility]. Please arrive by 1145am.  Contact information: [address], [address] [place] [id number] [phone]       Jonelle SidleMcDowell, Samuel G, MD Follow up on 02/07/2018.   Specialty:  Cardiology Why:  Your post-hospital follow up appointment will be on Friday, 02/07/18 at 1120am with Dr. Diona BrownerMcDowell.  You have also been set up with a follow up (monitor appointment) with Dr. Diona BrownerMcDowell on 04/07/18 at [facility]  Contact information: [address] [place] [id number] [phone]          Discharge Instructions    Call MD for:  difficulty breathing, headache or visual disturbances   Complete by:  As directed    Call MD for:  persistant dizziness or light-headedness   Complete by:  As directed    Call MD for:  persistant nausea and vomiting   Complete by:  As directed    Call MD for:  redness, tenderness, or signs of infection (pain, swelling, redness, odor or green/yellow discharge around incision site)   Complete by:  As directed    Call MD for:  severe uncontrolled pain   Complete by:  As directed    Call MD for:  temperature >100.4   Complete by:  As directed    Diet - low sodium heart healthy   Complete by:  As directed    Discharge instructions   Complete by:  As directed    If you notice any bleeding such as blood in stool, black tarry stools, blood in urine, nosebleeds or any other unusual bleeding, call your doctor immediately.  No driving for 3 days. No lifting over 5 lbs for 1 week. No sexual activity for 1 week. Keep procedure site clean & dry. If you notice increased pain, swelling, bleeding or pus, call/return!  You may shower, but no soaking baths/hot tubs/pools for 1 week.   Please see appointment section for monitor placement appointment tomorrow,  02/05/18 at 12pm.   Increase activity slowly   Complete by:  As directed      Discharge Medications   Allergies as of 02/04/2018   No Known Allergies     Medication List    STOP taking these medications   metoprolol succinate 50 MG 24 hr tablet Commonly known as:  TOPROL-XL     TAKE these medications   allopurinol 300 MG tablet Commonly known as:  ZYLOPRIM Take 300 mg by mouth every  evening.   amLODipine 5 MG tablet Commonly known as:  NORVASC Take 5 mg by mouth every evening.   apixaban 5 MG Tabs tablet Commonly known as:  ELIQUIS Take 1 tablet (5 mg total) by mouth 2 (two) times daily.   aspirin 81 MG EC tablet Take 1 tablet (81 mg total) by mouth daily.   atorvastatin 20 MG tablet Commonly known as:  LIPITOR Take 20 mg by mouth every evening.   DERMEND BRUISE FORMULA Crea Apply 1 application topically as needed.   doxazosin 8 MG tablet Commonly known as:  CARDURA Take 8 mg by mouth daily.   furosemide 20 MG tablet Commonly known as:  LASIX Take 20 mg by mouth daily.   gemfibrozil 600 MG tablet Commonly known as:  LOPID Take 600 mg by mouth 2 (two) times daily.   sildenafil 20 MG tablet Commonly known as:  REVATIO Take 40-60 mg by mouth daily as needed.       Aspirin prescribed at discharge?  Yes High Intensity Statin Prescribed? (Lipitor 40-80mg  or Crestor 20-40mg ): No: intolerant to high doses  Beta Blocker Prescribed? No: bradycardic For EF <40%, was ACEI/ARB Prescribed? Yes ADP Receptor Inhibitor Prescribed? (i.e. Plavix etc.-Includes Medically Managed Patients): Yes For EF <40%, Aldosterone Inhibitor Prescribed? Yes Was EF assessed during THIS hospitalization? Yes Was Cardiac Rehab II ordered? (Included Medically managed Patients): No: Ambulated in room    Outstanding Labs/Studies   30-day monitor placement on 02/05/18  Duration of Discharge Encounter   Greater than 30 minutes including physician time.  Signed, Daron Offer, NP 02/04/2018, 2:04 PM

## 2018-02-04 NOTE — Care Management Important Message (Signed)
Important Message  Patient Details  Name: Craig [name] MRN: [id number] Date of Birth: Aug 04, 1935   Medicare Important Message Given:  Yes    Ayriel Texidor P Jordani Nunn 02/04/2018, 2:12 PM

## 2018-02-04 NOTE — Progress Notes (Signed)
Called by nurse  For  Sinus  Pause >3  Sec, pt  Is resting  Comfortably, asymptoamtic No chest pain .  Will request  And  Convey to have EP eval

## 2018-02-04 NOTE — Progress Notes (Addendum)
Progress Note  Patient Name: Craig [name] Date of Encounter: 02/04/2018  Primary Cardiologist: Dr. Diona Browner  Subjective   Pt feeling well today. Ready to go home. Continues to have pauses per tele review. He is asymptomatic. Plan is for 30-day monitor.   Inpatient Medications    Scheduled Meds: . amLODipine  5 mg Oral Daily  . apixaban  5 mg Oral BID  . atorvastatin  20 mg Oral q1800  . sodium chloride flush  3 mL Intravenous Q12H   Continuous Infusions: . sodium chloride     PRN Meds: sodium chloride, acetaminophen, magnesium hydroxide, ondansetron (ZOFRAN) IV, sodium chloride flush   Vital Signs    Vitals:   02/03/18 0949 02/03/18 1300 02/03/18 2023 02/04/18 0446  BP: (!) 144/65 122/64 127/71 (!) 149/81  Pulse:  (!) 51 83 (!) 34  Resp:  16 19 (!) 23  Temp:  98 F (36.7 C) 98.6 F (37 C) 98.5 F (36.9 C)  TempSrc:  Oral Oral Oral  SpO2:  94% (!) 89% (!) 87%  Weight:    200 lb (90.7 kg)  Height:        Intake/Output Summary (Last 24 hours) at 02/04/2018 0757 Last data filed at 02/04/2018 0448 Gross per 24 hour  Intake 483 ml  Output 275 ml  Net 208 ml   Filed Weights   02/02/18 0451 02/03/18 0413 02/04/18 0446  Weight: 199 lb 6.4 oz (90.4 kg) 198 lb 12.8 oz (90.2 kg) 200 lb (90.7 kg)    Physical Exam   General: Well developed, well nourished, NAD Skin: Warm, dry, intact  Head: Normocephalic, atraumatic, clear, moist mucus membranes. Neck: Negative for carotid bruits. No JVD Lungs:Clear to ausculation bilaterally. No wheezes, rales, or rhonchi. Breathing is unlabored. Cardiovascular: RRR with S1 S2. No murmurs, rubs or gallops Abdomen: Soft, non-tender, non-distended with normoactive bowel sounds. No obvious abdominal masses. MSK: Strength and tone appear normal for age. 5/5 in all extremities Extremities: No edema. No clubbing or cyanosis. DP/PT pulses 2+ bilaterally Neuro: Alert and oriented. No focal deficits. No facial asymmetry. MAE  spontaneously. Psych: Responds to questions appropriately with normal affect.    Labs    Chemistry Recent Labs  Lab 01/30/18 1536 01/31/18 0507 02/02/18 0456 02/03/18 0528  NA 138 138 138 138  K 4.4 4.0 3.6 3.1*  CL 104 106 107 107  CO2 22 22 21* 21*  GLUCOSE 113* 113* 105* 113*  BUN 33* 32* 30* 23*  CREATININE 1.76* 1.69* 1.39* 1.22  CALCIUM 10.1 9.8 9.6 9.5  PROT 6.9  --   --   --   ALBUMIN 3.5  --   --   --   AST 48*  --   --   --   ALT 21  --   --   --   ALKPHOS 79  --   --   --   BILITOT 0.9  --   --   --   GFRNONAA 35* 37* 46* 54*  GFRAA 40* 42* 54* >60  ANIONGAP 12 10 10 10      Hematology Recent Labs  Lab 02/02/18 0456 02/03/18 0528 02/04/18 0339  WBC 8.3 7.7 7.1  RBC 3.63* 3.69* 3.69*  HGB 12.9* 13.1 12.8*  HCT 38.2* 39.0 38.6*  MCV 105.2* 105.7* 104.6*  MCH 35.5* 35.5* 34.7*  MCHC 33.8 33.6 33.2  RDW 14.5 14.6 14.1  PLT 157 170 190    Cardiac Enzymes Recent Labs  Lab 01/30/18 1536  01/30/18 2309 01/31/18 0507 01/31/18 1049  TROPONINI 10.83* 13.32* 10.62* 9.70*   No results for input(s): TROPIPOC in the last 168 hours.   BNPNo results for input(s): BNP, PROBNP in the last 168 hours.   DDimer No results for input(s): DDIMER in the last 168 hours.   Radiology    No results found.  Telemetry    02/04/18 Atrial fibrillation HR 77 - Personally Reviewed  ECG    No new tracing as of 02/04/18- Personally Reviewed  Cardiac Studies    Cardiaccatheterization 01/31/2018:  Mid Cx lesion is 25% stenosed.  Mid LAD lesion is 25% stenosed.  Prox LAD lesion is 25% stenosed.  Prox RCA lesion is 100% stenosed. Large dominant vessel. There are left to right collaterals. Culprit lesion.  There is no aortic valve stenosis.  LV end diastolic pressure is mildly elevated.  Medical therapy. RCA occlusion occurred on Monday based on symptoms. No angina in over 24 hours. Occlusion looks organized and there would be high risk of distal  embolization attempt at intervention since the vessel is likely been occluded for 4 days. He may be having some heart rate issues due to the occluded RCA. Hopefully, these will resolve.   Restart heparin in 6 hours. If the patient had further anginal symptoms in the future, could consider PCI at a later time.  Echocardiogram 02/01/2018: Study Conclusions  - Left ventricle: Focal thinning and hypokinesis of the mid septum. The cavity size was normal. There was mild focal basal hypertrophy of the septum. Systolic function was normal. The estimated ejection fraction was in the range of 60% to 65%. The study is not technically sufficient to allow evaluation of LV diastolic function. - Aortic valve: There was mild regurgitation. - Mitral valve: There was mild regurgitation. - Left atrium: The atrium was moderately dilated. - Atrial septum: There was increased thickness of the septum, consistent with lipomatous hypertrophy. No defect or patent foramen ovale was identified. - Pulmonary arteries: PA peak pressure: 44 mm Hg (S).  Patient Profile     83 y.o. male with chronic atrial fibrillation, hypertension, CKD stage III, and hyperlipidemia. He presents with NSTEMI, troponin I up to 13.32. Cardiac catheterization shows occluded RCA with left-to-right collaterals and plan is medical therapy at this point  Assessment & Plan    1.NSTEMI: -Denies chest pain this AM -Trop, 10.83>13.32>10.62>9.70 -EKG with atrial fibrillation with HR 40's with no acute ST-T wave abnormalities. He has been  asymptomatic with this rate  -BB held secondary to bradycardia>>>per tele review, pt continuing to have frequent pauses -Cardiac cath 01/31/18 with occluded RCA with left-to-right collaterals and plan for medical therapy at this time -ASA, statin  -No BB secondary to bradycardia   2.Chronic atrial fibrillation: -Pt hasconsistently refused anticoagulationper primary cardiology  office note however long discussion about post-cath anticoagulation given permanent Afib and stroke protection. He is willing to try Eliquis at this time. He was under the impression that he would need to be on Coumadin and was not interested in the diet and lab restrictions associated with that. He has a cleared understanding now. No hx of GI bleed or stroke -Pt agreeable to anticoagulation, Eliquis 5mg  started 02/01/18 -Remains bradycardiac with frequent pauses and is asymptomatic with this>>possibly associated with RCA occlusion?  -Plan for 30-day monitor at discharge. If continues to have pauses, will need EP consult for possible PPM.  Pt has not had any blocking agents since admission on 01/30/18 -No BB at this time secondary to persistent  bradycardia  3.NSVT: -Resolved -Burst of NSVT on tele>>>however given bradycardiac and pauses>>would not resume BB at this time  -LVEF normal at 60-65% by echocardiogram  4.Acute on chronic kidney disease stage 3: -Creatinine, 1.22 on 02/03/18, down from 1.39 on 02/02/18 -Baseline appears to be in the 1.0 range -Avoid nephrotoxic medications>>dowtrending post-cath on 01/31/18  5.Hyperlipidemia: -Stable, CHO 129, LDL-76, HDL-32, Trig 103 on 01/31/18 -Continue Lipitor   6.Essential hypertension: -Stable, 125/70>139/82>135/54 -Consider adding Norvasc 5mg  PO back to regimen    Signed, Georgie Chard NP-C [facility] Pager: [phone] 02/04/2018, 7:57 AM     Patient seen and examined. Agree with assessment and plan. Feels well.  AF rate in the mid 70s.  Renal fxn stable, now on increased eliquis at 5 mg bid.  NO bleeding.  For dc today with plan for outpatient monitor in [place] with Dr. Diona Browner. B12/folate not low with macrocytic indices.    Lennette Bihari, MD, [facility] 02/04/2018 9:52 AM    For questions or updates, please contact   Please consult [email or web address] for contact info under Cardiology/STEMI.

## 2018-02-05 ENCOUNTER — Other Ambulatory Visit: Payer: Self-pay | Admitting: Cardiology

## 2018-02-05 ENCOUNTER — Telehealth: Payer: Self-pay

## 2018-02-05 ENCOUNTER — Telehealth: Payer: Self-pay | Admitting: *Deleted

## 2018-02-05 ENCOUNTER — Ambulatory Visit (INDEPENDENT_AMBULATORY_CARE_PROVIDER_SITE_OTHER): Payer: Medicare Other

## 2018-02-05 DIAGNOSIS — I482 Chronic atrial fibrillation, unspecified: Secondary | ICD-10-CM

## 2018-02-05 DIAGNOSIS — R001 Bradycardia, unspecified: Secondary | ICD-10-CM

## 2018-02-05 NOTE — Telephone Encounter (Signed)
[employer] called today to report a critical event. They are going to fax it over.

## 2018-02-05 NOTE — Telephone Encounter (Signed)
Report from monitor company received by fax.  Pt seen in [place].  Reports states they spoke to [place] today.  I called [place] office and confirmed that [employer] did speak with her today.   Left message that medical records [facility].

## 2018-02-05 NOTE — Progress Notes (Signed)
Cardiology Office Note  Date: 02/07/2018   ID: EWALD BEG, DOB 01/19/1935, MRN [id number]  PCP: Ignatius Specking, MD  Primary Cardiologist: Nona Dell, MD   Chief Complaint  Patient presents with  . Hospitalization Follow-up    History of Present Illness: Craig [name] is an 83 y.o. male presenting to the office after just being discharged from the hospital on April 23.  He was admitted with NSTEMI, peak troponin I 13.32.  He underwent cardiac catheterization demonstrating an occluded RCA with left-to-right collaterals with plan for medical therapy.  During his stay he was noted to be bradycardic resulting in discontinuation of beta-blocker.  He also had NSVT by telemetry that was asymptomatic, LVEF 60-65% by echocardiogram.  He was discharged home with plan for a 30-day event monitor.  He presents today for follow-up.  Heart rate has come up since hospitalization, in the high 90s at rest, increases when he walks around in the room.  He has had no dizziness or syncope.  Somewhat short of breath with activity.  No angina.  I reviewed his current medications.  He is now on aspirin along with Eliquis, he supposed to be on Lipitor 20 mg daily but does not have this at home.  Also continues on Lasix and Norvasc.  We discussed adding back very low-dose Toprol-XL for now and can titrate further depending on his heart rate overall.  Past Medical History:  Diagnosis Date  . Arthritis   . Coronary atherosclerosis of native coronary artery    Nonobstructive at catheterization 2006  . Essential hypertension, benign   . Mixed hyperlipidemia   . Permanent atrial fibrillation (HCC)    Declined anticoagulation with Dr. Andee Lineman 06/2012    Past Surgical History:  Procedure Laterality Date  . JOINT REPLACEMENT     hip left  . LEFT HEART CATH AND CORONARY ANGIOGRAPHY N/A 01/31/2018   Procedure: LEFT HEART CATH AND CORONARY ANGIOGRAPHY;  Surgeon: Corky Crafts, MD;  Location: [facility] INVASIVE  CV LAB;  Service: Cardiovascular;  Laterality: N/A;  . LUMBAR LAMINECTOMY  07/21/2012   Procedure: MICRODISCECTOMY LUMBAR LAMINECTOMY;  Surgeon: Eldred Manges, MD;  Location: [facility] OR;  Service: Orthopedics;  Laterality: Left;  Left L5-S1 Foraminotomy, Microdiscectomy  . MICRODISCECTOMY LUMBAR  07/21/2012  . ULTRASOUND GUIDANCE FOR VASCULAR ACCESS  01/31/2018   Procedure: Ultrasound Guidance For Vascular Access;  Surgeon: Corky Crafts, MD;  Location: [facility] INVASIVE CV LAB;  Service: Cardiovascular;;    Current Outpatient Medications  Medication Sig Dispense Refill  . allopurinol (ZYLOPRIM) 300 MG tablet Take 300 mg by mouth every evening.     Marland Kitchen amLODipine (NORVASC) 5 MG tablet Take 5 mg by mouth every evening.     Marland Kitchen apixaban (ELIQUIS) 5 MG TABS tablet Take 1 tablet (5 mg total) by mouth 2 (two) times daily. 120 tablet 3  . aspirin EC 81 MG EC tablet Take 1 tablet (81 mg total) by mouth daily.    Marland Kitchen doxazosin (CARDURA) 8 MG tablet Take 8 mg by mouth daily.     . Emollient (DERMEND BRUISE FORMULA) CREA Apply 1 application topically as needed.    . furosemide (LASIX) 20 MG tablet Take 20 mg by mouth daily.     Marland Kitchen gemfibrozil (LOPID) 600 MG tablet Take 600 mg by mouth 2 (two) times daily.     . sildenafil (REVATIO) 20 MG tablet Take 40-60 mg by mouth daily as needed.  1  . atorvastatin (LIPITOR) 20 MG  tablet Take 1 tablet (20 mg total) by mouth every evening. 90 tablet 3  . metoprolol succinate (TOPROL XL) 25 MG 24 hr tablet Take 0.5 tablets (12.5 mg total) by mouth daily. 45 tablet 1   No current facility-administered medications for this visit.    Allergies:  Patient has no known allergies.   Social History: The patient  reports that he quit smoking about 35 years ago. His smoking use included cigarettes. He has a 30.00 pack-year smoking history. He has never used smokeless tobacco. He reports that he drinks about 8.4 oz of alcohol per week. He reports that he does not use drugs.   ROS:   Please see the history of present illness. Otherwise, complete review of systems is positive for fatigue.  All other systems are reviewed and negative.   Physical Exam: VS:  BP 124/64   Pulse 90   Ht 6\' 1"  (1.854 m)   Wt 199 lb (90.3 kg)   SpO2 (!) 87%   BMI 26.25 kg/m , BMI Body mass index is 26.25 kg/m.  Wt Readings from Last 3 Encounters:  02/07/18 199 lb (90.3 kg)  02/04/18 200 lb (90.7 kg)  06/19/17 200 lb (90.7 kg)    General: Elderly male, appears comfortable at rest. HEENT: Conjunctiva and lids normal, oropharynx clear. Neck: Supple, no elevated JVP or carotid bruits, no thyromegaly. Lungs: Clear to auscultation, nonlabored breathing at rest. Cardiac: Irregularly irregular, no S3, 2/6  systolic murmur, no pericardial rub. Abdomen: Soft, nontender, bowel sounds present. Extremities: No pitting edema, distal pulses 2+. Skin: Warm and dry. Musculoskeletal: No kyphosis. Neuropsychiatric: Alert and oriented x3, affect grossly appropriate.  ECG: I personally reviewed the tracing from 02/02/2018 which showed probable slow atrial fibrillation with nonspecific ST-T changes.  Recent Labwork: 01/30/2018: ALT 21; AST 48 02/01/2018: Magnesium 2.1 02/03/2018: BUN 23; Creatinine, Ser 1.22; Potassium 3.1; Sodium 138 02/04/2018: Hemoglobin 12.8; Platelets 190     Component Value Date/Time   CHOL 129 01/31/2018 0507   TRIG 103 01/31/2018 0507   HDL 32 (L) 01/31/2018 0507   CHOLHDL 4.0 01/31/2018 0507   VLDL 21 01/31/2018 0507   LDLCALC 76 01/31/2018 0507    Other Studies Reviewed Today:  Echocardiogram 02/01/2018: Study Conclusions  - Left ventricle: Focal thinning and hypokinesis of the mid septum.   The cavity size was normal. There was mild focal basal   hypertrophy of the septum. Systolic function was normal. The   estimated ejection fraction was in the range of 60% to 65%. The   study is not technically sufficient to allow evaluation of LV   diastolic function. - Aortic  valve: There was mild regurgitation. - Mitral valve: There was mild regurgitation. - Left atrium: The atrium was moderately dilated. - Atrial septum: There was increased thickness of the septum,   consistent with lipomatous hypertrophy. No defect or patent   foramen ovale was identified. - Pulmonary arteries: PA peak pressure: 44 mm Hg (S).  Cardiac catheterization 01/31/2018:  Mid Cx lesion is 25% stenosed.  Mid LAD lesion is 25% stenosed.  Prox LAD lesion is 25% stenosed.  Prox RCA lesion is 100% stenosed. Large dominant vessel. There are left to right collaterals. Culprit lesion.  There is no aortic valve stenosis.  LV end diastolic pressure is mildly elevated.   Medical therapy.  RCA occlusion occurred on Monday based on symptoms.  No angina in over 24 hours.  Occlusion looks organized and there would be high risk of distal embolization attempt  at intervention since the vessel is likely been occluded for 4 days.  He may be having some heart rate issues due to the occluded RCA.  Hopefully, these will resolve.  Assessment and Plan:  1.  Recent NSTEMI, no recurrent angina at this time.  Cardiac catheterization demonstrated occluded RCA with left-to-right collaterals and otherwise mild nonobstructive disease.  Plan was for medical therapy at that time.  Would continue low-dose aspirin for now, prescription provided for Lipitor to continue as well.  LVEF 60-65% range by echocardiogram.  2.  Chronic atrial fibrillation.  Heart rate has come back up, he was bradycardic in the hospital possibly in the setting of ACS in the RCA distribution and beta-blocker was stopped initially.  He is now wearing a cardiac monitor and I would like to start him back on Toprol-XL beginning at 12.5 mg daily.  Not sure that we will need to complete the entire 30-day monitoring, will bring him back in a few weeks for reassessment.  He has agreed to anticoagulation as noted in the hospital documentation.  Now on  Eliquis.  3.  Essential hypertension, on Norvasc.  Blood pressure well controlled today.  4.  Hyperlipidemia, on Lipitor.   Current medicines were reviewed with the patient today.  Disposition: Follow-up in 2 weeks in the [place] office.Leonides Schanz, Jonelle Sidle, MD, [facility] 02/07/2018 11:27 AM    [facility] at [facility] [address] [place], [place], [place] [address] Phone: [phone]; Fax: [phone]

## 2018-02-05 NOTE — Telephone Encounter (Signed)
Finally got preventice report. Gave to [place] Strader, PA-C to review and advise.

## 2018-02-05 NOTE — Telephone Encounter (Signed)
   Episodes of atrial fibrillation noted following monitor placement and the patient has known atrial fibrillation by review of his chart. Was recently started on Eliquis for anticoagulation. One strip shows a possible pause but this is difficult to interpret given the report sent shows low-voltage during that time-frame. Monitor just placed today. Continue to follow. Has follow-up with Dr. Diona BrownerMcDowell on 4/26.  Signed, Ellsworth LennoxBrittany M Jameca Chumley, PA-C 02/05/2018, 4:57 PM Pager: [phone]

## 2018-02-07 ENCOUNTER — Encounter: Payer: Self-pay | Admitting: Cardiology

## 2018-02-07 ENCOUNTER — Ambulatory Visit (INDEPENDENT_AMBULATORY_CARE_PROVIDER_SITE_OTHER): Payer: Medicare Other | Admitting: Cardiology

## 2018-02-07 VITALS — BP 124/64 | HR 90 | Ht 73.0 in | Wt 199.0 lb

## 2018-02-07 DIAGNOSIS — I482 Chronic atrial fibrillation: Secondary | ICD-10-CM | POA: Diagnosis not present

## 2018-02-07 DIAGNOSIS — I25119 Atherosclerotic heart disease of native coronary artery with unspecified angina pectoris: Secondary | ICD-10-CM

## 2018-02-07 DIAGNOSIS — I10 Essential (primary) hypertension: Secondary | ICD-10-CM

## 2018-02-07 DIAGNOSIS — I214 Non-ST elevation (NSTEMI) myocardial infarction: Secondary | ICD-10-CM

## 2018-02-07 DIAGNOSIS — I4821 Permanent atrial fibrillation: Secondary | ICD-10-CM

## 2018-02-07 DIAGNOSIS — E782 Mixed hyperlipidemia: Secondary | ICD-10-CM

## 2018-02-07 DIAGNOSIS — R001 Bradycardia, unspecified: Secondary | ICD-10-CM

## 2018-02-07 MED ORDER — METOPROLOL SUCCINATE ER 25 MG PO TB24: 12.5000 mg | ORAL_TABLET | Freq: Every day | ORAL | 1 refills | Status: DC

## 2018-02-07 MED ORDER — ATORVASTATIN CALCIUM 20 MG PO TABS: 20.0000 mg | ORAL_TABLET | Freq: Every evening | ORAL | 3 refills | Status: AC

## 2018-02-07 NOTE — Patient Instructions (Signed)
Medication Instructions:   Your physician has recommended you make the following change in your medication:   Start toprol xl 12.5 mg by mouth daily.  Continue all other medications the same.  Labwork:  NONE  Testing/Procedures:  NONE  Follow-Up:  Your physician recommends that you schedule a follow-up appointment in: 2 weeks with Randall AnBrittany Strader PA.  Any Other Special Instructions Will Be Listed Below (If Applicable).  If you need a refill on your cardiac medications before your next appointment, please call your pharmacy.

## 2018-02-08 ENCOUNTER — Telehealth: Payer: Self-pay | Admitting: Physician Assistant

## 2018-02-08 ENCOUNTER — Inpatient Hospital Stay (HOSPITAL_COMMUNITY): Payer: Medicare Other

## 2018-02-08 ENCOUNTER — Telehealth: Payer: Self-pay | Admitting: Internal Medicine

## 2018-02-08 ENCOUNTER — Inpatient Hospital Stay (HOSPITAL_COMMUNITY)
Admission: AD | Admit: 2018-02-08 | Discharge: 2018-02-26 | DRG: 280 | Disposition: A | Discharge status: Skilled Nursing Facility | Payer: Medicare Other | Source: Other Acute Inpatient Hospital | Attending: Internal Medicine | Admitting: Internal Medicine

## 2018-02-08 ENCOUNTER — Encounter (HOSPITAL_COMMUNITY): Payer: Self-pay | Admitting: Family Medicine

## 2018-02-08 DIAGNOSIS — I2510 Atherosclerotic heart disease of native coronary artery without angina pectoris: Secondary | ICD-10-CM | POA: Diagnosis present

## 2018-02-08 DIAGNOSIS — I482 Chronic atrial fibrillation: Secondary | ICD-10-CM | POA: Diagnosis present

## 2018-02-08 DIAGNOSIS — Z7189 Other specified counseling: Secondary | ICD-10-CM | POA: Diagnosis not present

## 2018-02-08 DIAGNOSIS — J939 Pneumothorax, unspecified: Secondary | ICD-10-CM

## 2018-02-08 DIAGNOSIS — M109 Gout, unspecified: Secondary | ICD-10-CM | POA: Diagnosis present

## 2018-02-08 DIAGNOSIS — W19XXXA Unspecified fall, initial encounter: Secondary | ICD-10-CM | POA: Diagnosis not present

## 2018-02-08 DIAGNOSIS — R944 Abnormal results of kidney function studies: Secondary | ICD-10-CM | POA: Diagnosis not present

## 2018-02-08 DIAGNOSIS — R29898 Other symptoms and signs involving the musculoskeletal system: Secondary | ICD-10-CM | POA: Diagnosis not present

## 2018-02-08 DIAGNOSIS — Z7982 Long term (current) use of aspirin: Secondary | ICD-10-CM

## 2018-02-08 DIAGNOSIS — I130 Hypertensive heart and chronic kidney disease with heart failure and stage 1 through stage 4 chronic kidney disease, or unspecified chronic kidney disease: Principal | ICD-10-CM | POA: Diagnosis present

## 2018-02-08 DIAGNOSIS — J9601 Acute respiratory failure with hypoxia: Secondary | ICD-10-CM | POA: Diagnosis present

## 2018-02-08 DIAGNOSIS — E785 Hyperlipidemia, unspecified: Secondary | ICD-10-CM | POA: Diagnosis present

## 2018-02-08 DIAGNOSIS — J930 Spontaneous tension pneumothorax: Secondary | ICD-10-CM | POA: Diagnosis not present

## 2018-02-08 DIAGNOSIS — I5033 Acute on chronic diastolic (congestive) heart failure: Secondary | ICD-10-CM | POA: Diagnosis present

## 2018-02-08 DIAGNOSIS — J690 Pneumonitis due to inhalation of food and vomit: Secondary | ICD-10-CM | POA: Diagnosis present

## 2018-02-08 DIAGNOSIS — J810 Acute pulmonary edema: Secondary | ICD-10-CM | POA: Diagnosis not present

## 2018-02-08 DIAGNOSIS — E43 Unspecified severe protein-calorie malnutrition: Secondary | ICD-10-CM

## 2018-02-08 DIAGNOSIS — J432 Centrilobular emphysema: Secondary | ICD-10-CM | POA: Diagnosis present

## 2018-02-08 DIAGNOSIS — Z87891 Personal history of nicotine dependence: Secondary | ICD-10-CM

## 2018-02-08 DIAGNOSIS — R0600 Dyspnea, unspecified: Secondary | ICD-10-CM

## 2018-02-08 DIAGNOSIS — J9311 Primary spontaneous pneumothorax: Secondary | ICD-10-CM | POA: Diagnosis not present

## 2018-02-08 DIAGNOSIS — N183 Chronic kidney disease, stage 3 unspecified: Secondary | ICD-10-CM | POA: Diagnosis present

## 2018-02-08 DIAGNOSIS — E876 Hypokalemia: Secondary | ICD-10-CM | POA: Diagnosis present

## 2018-02-08 DIAGNOSIS — I951 Orthostatic hypotension: Secondary | ICD-10-CM | POA: Diagnosis not present

## 2018-02-08 DIAGNOSIS — J811 Chronic pulmonary edema: Secondary | ICD-10-CM

## 2018-02-08 DIAGNOSIS — I422 Other hypertrophic cardiomyopathy: Secondary | ICD-10-CM | POA: Diagnosis not present

## 2018-02-08 DIAGNOSIS — D72829 Elevated white blood cell count, unspecified: Secondary | ICD-10-CM | POA: Diagnosis not present

## 2018-02-08 DIAGNOSIS — I340 Nonrheumatic mitral (valve) insufficiency: Secondary | ICD-10-CM | POA: Diagnosis not present

## 2018-02-08 DIAGNOSIS — R0602 Shortness of breath: Secondary | ICD-10-CM

## 2018-02-08 DIAGNOSIS — R531 Weakness: Secondary | ICD-10-CM | POA: Diagnosis present

## 2018-02-08 DIAGNOSIS — I4821 Permanent atrial fibrillation: Secondary | ICD-10-CM | POA: Diagnosis present

## 2018-02-08 DIAGNOSIS — E782 Mixed hyperlipidemia: Secondary | ICD-10-CM | POA: Diagnosis present

## 2018-02-08 DIAGNOSIS — R5381 Other malaise: Secondary | ICD-10-CM | POA: Diagnosis present

## 2018-02-08 DIAGNOSIS — I214 Non-ST elevation (NSTEMI) myocardial infarction: Secondary | ICD-10-CM | POA: Diagnosis present

## 2018-02-08 DIAGNOSIS — Z79899 Other long term (current) drug therapy: Secondary | ICD-10-CM

## 2018-02-08 DIAGNOSIS — J189 Pneumonia, unspecified organism: Secondary | ICD-10-CM

## 2018-02-08 DIAGNOSIS — Z96642 Presence of left artificial hip joint: Secondary | ICD-10-CM | POA: Diagnosis present

## 2018-02-08 DIAGNOSIS — N400 Enlarged prostate without lower urinary tract symptoms: Secondary | ICD-10-CM | POA: Diagnosis present

## 2018-02-08 DIAGNOSIS — J849 Interstitial pulmonary disease, unspecified: Secondary | ICD-10-CM | POA: Diagnosis present

## 2018-02-08 DIAGNOSIS — Y92230 Patient room in hospital as the place of occurrence of the external cause: Secondary | ICD-10-CM | POA: Diagnosis not present

## 2018-02-08 DIAGNOSIS — I77810 Thoracic aortic ectasia: Secondary | ICD-10-CM | POA: Diagnosis present

## 2018-02-08 DIAGNOSIS — R627 Adult failure to thrive: Secondary | ICD-10-CM | POA: Diagnosis present

## 2018-02-08 DIAGNOSIS — K5900 Constipation, unspecified: Secondary | ICD-10-CM | POA: Diagnosis not present

## 2018-02-08 DIAGNOSIS — R739 Hyperglycemia, unspecified: Secondary | ICD-10-CM | POA: Diagnosis not present

## 2018-02-08 DIAGNOSIS — Z7901 Long term (current) use of anticoagulants: Secondary | ICD-10-CM

## 2018-02-08 DIAGNOSIS — R0603 Acute respiratory distress: Secondary | ICD-10-CM

## 2018-02-08 DIAGNOSIS — L8990 Pressure ulcer of unspecified site, unspecified stage: Secondary | ICD-10-CM

## 2018-02-08 DIAGNOSIS — R0902 Hypoxemia: Secondary | ICD-10-CM

## 2018-02-08 DIAGNOSIS — T380X5A Adverse effect of glucocorticoids and synthetic analogues, initial encounter: Secondary | ICD-10-CM | POA: Diagnosis not present

## 2018-02-08 DIAGNOSIS — I472 Ventricular tachycardia: Secondary | ICD-10-CM | POA: Diagnosis not present

## 2018-02-08 DIAGNOSIS — B370 Candidal stomatitis: Secondary | ICD-10-CM | POA: Diagnosis not present

## 2018-02-08 DIAGNOSIS — J9690 Respiratory failure, unspecified, unspecified whether with hypoxia or hypercapnia: Secondary | ICD-10-CM

## 2018-02-08 DIAGNOSIS — D7589 Other specified diseases of blood and blood-forming organs: Secondary | ICD-10-CM | POA: Diagnosis present

## 2018-02-08 DIAGNOSIS — I351 Nonrheumatic aortic (valve) insufficiency: Secondary | ICD-10-CM | POA: Diagnosis not present

## 2018-02-08 DIAGNOSIS — T446X5A Adverse effect of alpha-adrenoreceptor antagonists, initial encounter: Secondary | ICD-10-CM | POA: Diagnosis not present

## 2018-02-08 DIAGNOSIS — I361 Nonrheumatic tricuspid (valve) insufficiency: Secondary | ICD-10-CM | POA: Diagnosis not present

## 2018-02-08 DIAGNOSIS — I252 Old myocardial infarction: Secondary | ICD-10-CM

## 2018-02-08 LAB — LACTIC ACID, PLASMA: LACTIC ACID, VENOUS: 1.7 mmol/L (ref 0.5–1.9)

## 2018-02-08 LAB — TROPONIN I: TROPONIN I: 1.31 ng/mL — AB (ref <0.03)

## 2018-02-08 MED ORDER — ACETAMINOPHEN 325 MG PO TABS
650.0000 mg | ORAL_TABLET | ORAL | Status: DC | PRN
  Administered 2018-02-16: 650 mg via ORAL
  Filled 2018-02-08: qty 2

## 2018-02-08 MED ORDER — ASPIRIN EC 81 MG PO TBEC
81.0000 mg | DELAYED_RELEASE_TABLET | Freq: Every day | ORAL | Status: DC
  Administered 2018-02-09 – 2018-02-26 (×18): 81 mg via ORAL
  Filled 2018-02-08 (×18): qty 1

## 2018-02-08 MED ORDER — FUROSEMIDE 10 MG/ML IJ SOLN
40.0000 mg | Freq: Two times a day (BID) | INTRAMUSCULAR | Status: DC
  Administered 2018-02-09 – 2018-02-10 (×4): 40 mg via INTRAVENOUS
  Filled 2018-02-08 (×4): qty 4

## 2018-02-08 MED ORDER — APIXABAN 5 MG PO TABS
5.0000 mg | ORAL_TABLET | Freq: Two times a day (BID) | ORAL | Status: DC
  Administered 2018-02-09 – 2018-02-22 (×28): 5 mg via ORAL
  Filled 2018-02-08 (×29): qty 1

## 2018-02-08 MED ORDER — ALPRAZOLAM 0.25 MG PO TABS
0.2500 mg | ORAL_TABLET | Freq: Two times a day (BID) | ORAL | Status: DC | PRN
  Administered 2018-02-13 – 2018-02-14 (×2): 0.25 mg via ORAL
  Filled 2018-02-08 (×2): qty 1

## 2018-02-08 MED ORDER — ONDANSETRON HCL 4 MG/2ML IJ SOLN: 4.0000 mg | Freq: Four times a day (QID) | INTRAMUSCULAR | Status: DC | PRN

## 2018-02-08 MED ORDER — GEMFIBROZIL 600 MG PO TABS
600.0000 mg | ORAL_TABLET | Freq: Two times a day (BID) | ORAL | Status: DC
  Administered 2018-02-09 – 2018-02-15 (×13): 600 mg via ORAL
  Filled 2018-02-08 (×15): qty 1

## 2018-02-08 MED ORDER — ATORVASTATIN CALCIUM 20 MG PO TABS
20.0000 mg | ORAL_TABLET | Freq: Every day | ORAL | Status: DC
  Administered 2018-02-10 – 2018-02-25 (×16): 20 mg via ORAL
  Filled 2018-02-08 (×16): qty 1

## 2018-02-08 MED ORDER — INSULIN ASPART 100 UNIT/ML ~~LOC~~ SOLN
0.0000 [IU] | SUBCUTANEOUS | Status: DC
Start: 2018-02-09 — End: 2018-02-14
  Administered 2018-02-09 – 2018-02-10 (×4): 1 [IU] via SUBCUTANEOUS
  Administered 2018-02-10: 2 [IU] via SUBCUTANEOUS
  Administered 2018-02-11: 1 [IU] via SUBCUTANEOUS
  Administered 2018-02-11 (×2): 3 [IU] via SUBCUTANEOUS
  Administered 2018-02-11: 2 [IU] via SUBCUTANEOUS
  Administered 2018-02-11 – 2018-02-12 (×2): 1 [IU] via SUBCUTANEOUS
  Administered 2018-02-12 (×4): 2 [IU] via SUBCUTANEOUS
  Administered 2018-02-13: 3 [IU] via SUBCUTANEOUS
  Administered 2018-02-13: 1 [IU] via SUBCUTANEOUS
  Administered 2018-02-13 – 2018-02-14 (×5): 2 [IU] via SUBCUTANEOUS
  Administered 2018-02-14: 1 [IU] via SUBCUTANEOUS

## 2018-02-08 MED ORDER — ALLOPURINOL 300 MG PO TABS
300.0000 mg | ORAL_TABLET | Freq: Every evening | ORAL | Status: DC
  Administered 2018-02-10 – 2018-02-25 (×16): 300 mg via ORAL
  Filled 2018-02-08 (×16): qty 1

## 2018-02-08 MED ORDER — SODIUM CHLORIDE 0.9% FLUSH
3.0000 mL | Freq: Two times a day (BID) | INTRAVENOUS | Status: DC
  Administered 2018-02-09 – 2018-02-26 (×34): 3 mL via INTRAVENOUS

## 2018-02-08 MED ORDER — METOPROLOL SUCCINATE ER 25 MG PO TB24
12.5000 mg | ORAL_TABLET | Freq: Every day | ORAL | Status: DC
  Administered 2018-02-09 – 2018-02-15 (×7): 12.5 mg via ORAL
  Filled 2018-02-08 (×7): qty 1

## 2018-02-08 MED ORDER — SODIUM CHLORIDE 0.9 % IV SOLN
250.0000 mL | INTRAVENOUS | Status: DC | PRN
  Administered 2018-02-19: 250 mL via INTRAVENOUS

## 2018-02-08 MED ORDER — DOXAZOSIN MESYLATE 8 MG PO TABS
8.0000 mg | ORAL_TABLET | Freq: Every day | ORAL | Status: DC
  Administered 2018-02-09 – 2018-02-14 (×6): 8 mg via ORAL
  Filled 2018-02-08 (×6): qty 1

## 2018-02-08 MED ORDER — SODIUM CHLORIDE 0.9% FLUSH: 3.0000 mL | INTRAVENOUS | Status: DC | PRN

## 2018-02-08 NOTE — Consult Note (Addendum)
Patient ID: Craig [name] MRN: [id number], DOB/AGE: 15-Feb-1935  Admit date: 02/08/2018 Primary Physician: Ignatius Specking, MD Primary Inpatient Attending: Odie Sera MD Primary Cardiologist: Diona Browner, MD  CARDIOLOGY CONSULT NOTE   PATIENT PROFILE   83 yo M with history of permanent atrial fibrillation, CAD s/p NSTEMI 01/2018. At the time he was found to have total occlusion of RCA, thought to be chronic. He was managed medically. During hospitalization tele showed NSVT and bradycardia. LVEF 60-65% by echocardiogram. He was discharged with 30 day event monitor.   HISTORY OF PRESENT ILLNESS   The patient saw Dr. Diona Browner on 4/26. At the time the patient felt overall well. He tells me that he developed worsening shortness of breath with activity in addition to lower extremity edema. The night of 4/26 he had a syncopal event, falling and hurting his elbow. He tells me that there was very little prodrome before he had LOC. He doesn't know how long he was unconscious. When he woke up he felt very weak and it was difficult for him to get to a safe location. He had a couple more episodes of presyncope, when he was able to call friends/family who came to his assistance. After his syncopal events his SOB worsened. No chest pain/angina. No orthopnea or PND. No palpitations. No fevers or chills. The cardiology office was called and he was encouraged to seek medical care immediately.  [place] ED Course: Upon arrival to the ED, patient is found to be afebrile, saturating in the 80s on room air, and vitals otherwise stable.  EKG features atrial fibrillation with T wave inversions.  Chemistry panel is notable for glucose of 224 and CBC features macrocytosis without anemia.  BNP was elevated to just over thousand and and troponin was elevated to 0.68.  A second troponin was obtained and elevated further to 0.81.  The patient was treated with 40 mg IV Lasix in the ED and supplemental oxygen.  Transfer to  [facility] was arranged for admission to the stepdown unit for ongoing evaluation and management of acute pulmonary edema with hypoxic respiratory failure.   When I interviewed the patient he told me he felt much better compared to presentation to ED.  Review of Systems General:  No chills, fever, night sweats or weight changes.  Cardiovascular:  See above Dermatological: No rash, lesions/masses Respiratory: No cough, dyspnea Urologic: No hematuria, dysuria Abdominal:   No nausea, vomiting, diarrhea, bright red blood per rectum, melena, or hematemesis Neurologic:  No visual changes, wkns, changes in mental status. All other systems reviewed and are otherwise negative except as noted above.   MEDICAL, FAMILY, AND SOCIAL HISTORY   Past Medical History:  Diagnosis Date  . Arthritis   . Coronary atherosclerosis of native coronary artery    Nonobstructive at catheterization 2006  . Essential hypertension, benign   . Mixed hyperlipidemia   . Permanent atrial fibrillation (HCC)    Declined anticoagulation with Dr. Andee Lineman 06/2012    Past Surgical History:  Procedure Laterality Date  . JOINT REPLACEMENT     hip left  . LEFT HEART CATH AND CORONARY ANGIOGRAPHY N/A 01/31/2018   Procedure: LEFT HEART CATH AND CORONARY ANGIOGRAPHY;  Surgeon: Corky Crafts, MD;  Location: [facility] INVASIVE CV LAB;  Service: Cardiovascular;  Laterality: N/A;  . LUMBAR LAMINECTOMY  07/21/2012   Procedure: MICRODISCECTOMY LUMBAR LAMINECTOMY;  Surgeon: Eldred Manges, MD;  Location: [facility] OR;  Service: Orthopedics;  Laterality: Left;  Left L5-S1 Foraminotomy, Microdiscectomy  .  MICRODISCECTOMY LUMBAR  07/21/2012  . ULTRASOUND GUIDANCE FOR VASCULAR ACCESS  01/31/2018   Procedure: Ultrasound Guidance For Vascular Access;  Surgeon: Corky Crafts, MD;  Location: [facility] INVASIVE CV LAB;  Service: Cardiovascular;;    No Known Allergies Prior to Admission medications   Medication Sig Start Date End Date Taking?  Authorizing Provider  allopurinol (ZYLOPRIM) 300 MG tablet Take 300 mg by mouth every evening.     [provider]  amLODipine (NORVASC) 5 MG tablet Take 5 mg by mouth every evening.  04/28/11   [provider]  apixaban (ELIQUIS) 5 MG TABS tablet Take 1 tablet (5 mg total) by mouth 2 (two) times daily. 02/04/18   Georgie Chard D, NP  aspirin EC 81 MG EC tablet Take 1 tablet (81 mg total) by mouth daily. 02/04/18   Filbert Schilder, NP  atorvastatin (LIPITOR) 20 MG tablet Take 1 tablet (20 mg total) by mouth every evening. 02/07/18   Jonelle Sidle, MD  doxazosin (CARDURA) 8 MG tablet Take 8 mg by mouth daily.  04/28/11   [provider]  Emollient (DERMEND BRUISE FORMULA) CREA Apply 1 application topically as needed.    [provider]  furosemide (LASIX) 20 MG tablet Take 20 mg by mouth daily.     [provider]  gemfibrozil (LOPID) 600 MG tablet Take 600 mg by mouth 2 (two) times daily.     [provider]  metoprolol succinate (TOPROL XL) 25 MG 24 hr tablet Take 0.5 tablets (12.5 mg total) by mouth daily. 02/07/18   Jonelle Sidle, MD  sildenafil (REVATIO) 20 MG tablet Take 40-60 mg by mouth daily as needed. 12/05/17   [provider]   Family History  Problem Relation Age of Onset  . Diabetes Neg Hx   . Hypertension Neg Hx   . Coronary artery disease Neg Hx    Social History   Socioeconomic History  . Marital status: Married    Spouse name: Not on file  . Number of children: Not on file  . Years of education: Not on file  . Highest education level: Not on file  Occupational History  . Not on file  Social Needs  . Financial resource strain: Not on file  . Food insecurity:    Worry: Not on file    Inability: Not on file  . Transportation needs:    Medical: Not on file    Non-medical: Not on file  Tobacco Use  . Smoking status: Former Smoker    Packs/day: 1.00    Years: 30.00    Pack years: 30.00    Types:  Cigarettes    Last attempt to quit: 10/15/1982    Years since quitting: 35.3  . Smokeless tobacco: Never Used  Substance and Sexual Activity  . Alcohol use: Yes    Alcohol/week: 8.4 oz    Types: 14 Shots of liquor per week    Comment: Occasional  . Drug use: No  . Sexual activity: Not on file  Lifestyle  . Physical activity:    Days per week: Not on file    Minutes per session: Not on file  . Stress: Not on file  Relationships  . Social connections:    Talks on phone: Not on file    Gets together: Not on file    Attends religious service: Not on file    Active member of club or organization: Not on file    Attends meetings of clubs  or organizations: Not on file    Relationship status: Not on file  . Intimate partner violence:    Fear of current or ex partner: Not on file    Emotionally abused: Not on file    Physically abused: Not on file    Forced sexual activity: Not on file  Other Topics Concern  . Not on file  Social History Narrative  . Not on file      PHYSICAL EXAM  Blood pressure 121/76, pulse 94, temperature 98.9 F (37.2 C), temperature source Axillary, resp. rate 20, height  (1.854 m), SpO2 93 %.  General: Pleasant, NAD Psych: Normal affect. Neuro: Alert and oriented X 3. Moves all extremities spontaneously. HEENT: Sclera anicteric, noninjected. MMM.  Neck: JVP is mildly elevated. Lungs:  rhconchi most apparent in the bases, wet crackles in mid-lungs Heart: irregular rhythm, no m/r/g Abdomen: Soft, non-tender, non-distended, BS + x 4.  Extremities: No clubbing or cyanosis. Edema: 2+ BL LE edma of feet and ankles. DP/PT/Radials 2+ and equal bilaterally.   LABS and STUDIES  Troponin (Point of Care Test) No results for input(s): TROPIPOC in the last 72 hours. No results for input(s): CKTOTAL, CKMB, TROPONINI in the last 72 hours. Lab Results  Component Value Date   WBC 7.1 02/04/2018   HGB 12.8 (L) 02/04/2018   HCT 38.6 (L) 02/04/2018   MCV 104.6  (H) 02/04/2018   PLT 190 02/04/2018    Recent Labs  Lab 02/03/18 0528  NA 138  K 3.1*  CL 107  CO2 21*  BUN 23*  CREATININE 1.22  CALCIUM 9.5  GLUCOSE 113*   Lab Results  Component Value Date   CHOL 129 01/31/2018   HDL 32 (L) 01/31/2018   LDLCALC 76 01/31/2018   TRIG 103 01/31/2018   No results found for: DDIMER   Radiology/Studies. Independently Reviewed CXR with diffuse interstial infiltrates in the mid lungs and bases  ECG was independently reviewed. Afib with no acute ischemic changes  Other Studies Reviewed Today:  Echocardiogram 02/01/2018: Study Conclusions  - Left ventricle: Focal thinning and hypokinesis of the mid septum. The cavity size was normal. There was mild focal basal hypertrophy of the septum. Systolic function was normal. The estimated ejection fraction was in the range of 60% to 65%. The study is not technically sufficient to allow evaluation of LV diastolic function. - Aortic valve: There was mild regurgitation. - Mitral valve: There was mild regurgitation. - Left atrium: The atrium was moderately dilated. - Atrial septum: There was increased thickness of the septum, consistent with lipomatous hypertrophy. No defect or patent foramen ovale was identified. - Pulmonary arteries: PA peak pressure: 44 mm Hg (S).  Cardiac catheterization 01/31/2018:  Mid Cx lesion is 25% stenosed.  Mid LAD lesion is 25% stenosed.  Prox LAD lesion is 25% stenosed.  Prox RCA lesion is 100% stenosed. Large dominant vessel. There are left to right collaterals. Culprit lesion.  There is no aortic valve stenosis.  LV end diastolic pressure is mildly elevated.  Medical therapy. RCA occlusion occurred on Monday based on symptoms. No angina in over 24 hours. Occlusion looks organized and there would be high risk of distal embolization attempt at intervention since the vessel is likely been occluded for 4 days. He may be having some heart rate  issues due to the occluded RCA. Hopefully, these will resolve.   ASSESSMENT and RECOMMENDATIONS   83 yo M with CAD (Recent NSTEMI, CTO of RCA), CKD, presents with syncope  and hypoxia.  Syncope. The patient had no significant prodrome. Given his recent MI, it is concerning that this event could have been arrhythmogenic in nature. Data from his ambulatory monitor will be helpful. In the meantime, will closely monitor on tele for bradycardia and/or VT. - Continue home BB - current rhytm is afib with heart rate ~100 bpm  SOB, Hypoxia. Given recent MI and the presence of LE edema + CXR infiltrates, CHF exacerbation appears to be contributing to his dyspnea and hypoxia. Also, he may have aspirated during his LOC event, and aspiration pneumonitis could  be contributing (his hypoxia seems out of proportion to his other heart failure symptoms). Low suspicion for PE given rhconchi + crackles on exam and infiltrate on CXR. Low suspicion for ACS; the mild troponin elevation is likely due to recent infarct and CHF exacerbation. - Agree with IV diuresis - Monitor for fever/leukocytosis - Repeat limited echo to evaluate for mechanical complication post MI  Atrial Fibrillation - Continue anticoagulation and beta blocker  CAD, recent NSTEMI, CTO of RCA - Continue Aspirin and Statin  Essential Hypertension - Continue home meds. Currently controlled  Hyperlipidemia - Continue statin   Remedios Mckone D, MD 02/08/2018, 11:33 PM

## 2018-02-08 NOTE — Progress Notes (Signed)
CRITICAL VALUE ALERT  Critical Value:  Trop 1.31  Date & Time Notied:  02/08/18 2350  Provider Notified: NP Blount made aware  Orders Received/Actions taken: No new orders at this time, pt asymptomatic, will continue to monitor.

## 2018-02-08 NOTE — H&P (Signed)
History and Physical    MADIX BLOWE ZOX:[id number] DOB: April 24, 1935 DOA: 02/08/2018  PCP: Ignatius Specking, MD   Patient coming from: Home, by way of [facility]   Chief Complaint: SOB, syncope   HPI: Craig [name] is a 83 y.o. male with medical history significant for permanent atrial fibrillation on Eliquis, coronary artery disease with recent NSTEMI medically managed, and chronic kidney disease stage III, now presenting with shortness of breath and a syncopal episode overnight.  Patient had experienced chest pain approximately 2 weeks ago, eventually went into the ED 3 days later after pain had resolved, was found to have had NSTEMI with troponin reaching 11, underwent catheterization on 01/31/2018, noted to have RCA occlusion, and medical management was recommended.  He was discharged home on 02/04/2018, saw his cardiologist in clinic yesterday, seemed to be doing fairly well at that time, but suffered a syncopal episode early this morning when he got up to use the bathroom, and has been experiencing shortness of breath since that time.  He denies any chest pain.  Denies fevers, chills, or cough.  Reports new onset of bilateral ankle edema.  [place] ED Course: Upon arrival to the ED, patient is found to be afebrile, saturating in the 80s on room air, and vitals otherwise stable.  EKG features atrial fibrillation with T wave inversions.  Chemistry panel is notable for glucose of 224 and CBC features macrocytosis without anemia.  BNP was elevated to just over thousand and and troponin was elevated to 0.68.  A second troponin was obtained and elevated further to 0.81.  The patient was treated with 40 mg IV Lasix in the ED and supplemental oxygen.  Transfer to [facility] was arranged for admission to the stepdown unit for ongoing evaluation and management of acute pulmonary edema with hypoxic respiratory failure.  All  Review of Systems:  All other systems reviewed and apart from  HPI, are negative.  Past Medical History:  Diagnosis Date  . Arthritis   . Coronary atherosclerosis of native coronary artery    Nonobstructive at catheterization 2006  . Essential hypertension, benign   . Mixed hyperlipidemia   . Permanent atrial fibrillation (HCC)    Declined anticoagulation with Dr. Andee Lineman 06/2012    Past Surgical History:  Procedure Laterality Date  . JOINT REPLACEMENT     hip left  . LEFT HEART CATH AND CORONARY ANGIOGRAPHY N/A 01/31/2018   Procedure: LEFT HEART CATH AND CORONARY ANGIOGRAPHY;  Surgeon: Corky Crafts, MD;  Location: [facility] INVASIVE CV LAB;  Service: Cardiovascular;  Laterality: N/A;  . LUMBAR LAMINECTOMY  07/21/2012   Procedure: MICRODISCECTOMY LUMBAR LAMINECTOMY;  Surgeon: Eldred Manges, MD;  Location: [facility] OR;  Service: Orthopedics;  Laterality: Left;  Left L5-S1 Foraminotomy, Microdiscectomy  . MICRODISCECTOMY LUMBAR  07/21/2012  . ULTRASOUND GUIDANCE FOR VASCULAR ACCESS  01/31/2018   Procedure: Ultrasound Guidance For Vascular Access;  Surgeon: Corky Crafts, MD;  Location: [facility] INVASIVE CV LAB;  Service: Cardiovascular;;     reports that he quit smoking about 35 years ago. His smoking use included cigarettes. He has a 30.00 pack-year smoking history. He has never used smokeless tobacco. He reports that he drinks about 8.4 oz of alcohol per week. He reports that he does not use drugs.  No Known Allergies  Family History  Problem Relation Age of Onset  . Diabetes Neg Hx   . Hypertension Neg Hx   . Coronary artery disease Neg Hx  Prior to Admission medications   Medication Sig Start Date End Date Taking? Authorizing Provider  allopurinol (ZYLOPRIM) 300 MG tablet Take 300 mg by mouth every evening.     [provider]  amLODipine (NORVASC) 5 MG tablet Take 5 mg by mouth every evening.  04/28/11   [provider]  apixaban (ELIQUIS) 5 MG TABS tablet Take 1 tablet (5 mg total) by mouth 2 (two) times daily. 02/04/18    Georgie Chard D, NP  aspirin EC 81 MG EC tablet Take 1 tablet (81 mg total) by mouth daily. 02/04/18   Filbert Schilder, NP  atorvastatin (LIPITOR) 20 MG tablet Take 1 tablet (20 mg total) by mouth every evening. 02/07/18   Jonelle Sidle, MD  doxazosin (CARDURA) 8 MG tablet Take 8 mg by mouth daily.  04/28/11   [provider]  Emollient (DERMEND BRUISE FORMULA) CREA Apply 1 application topically as needed.    [provider]  furosemide (LASIX) 20 MG tablet Take 20 mg by mouth daily.     [provider]  gemfibrozil (LOPID) 600 MG tablet Take 600 mg by mouth 2 (two) times daily.     [provider]  metoprolol succinate (TOPROL XL) 25 MG 24 hr tablet Take 0.5 tablets (12.5 mg total) by mouth daily. 02/07/18   Jonelle Sidle, MD  sildenafil (REVATIO) 20 MG tablet Take 40-60 mg by mouth daily as needed. 12/05/17   [provider]    Physical Exam: Vitals:   02/08/18 2133 02/08/18 2200  BP: 121/76   Pulse: 94 94  Resp: (!) 23 20  Temp: 98.9 F (37.2 C)   TempSrc: Axillary   SpO2: 96% 93%  Height:  (1.854 m)       Constitutional: NAD, calm  Eyes: PERTLA, lids and conjunctivae normal ENMT: Mucous membranes are moist. Posterior pharynx clear of any exudate or lesions.   Neck: normal, supple, no masses, no thyromegaly Respiratory: Diffuse rales. No pallor. No accessory muscle use.  Cardiovascular: Rate ~80 and irregular. Mild bilateral ankle edema.   Abdomen: No distension, no tenderness, soft. Bowel sounds normal.  Musculoskeletal: no clubbing / cyanosis. No joint deformity upper and lower extremities.   Skin: no significant rashes, lesions, ulcers. Warm, dry, well-perfused. Neurologic: No facial asymmetry. Sensation intact. Moving all extremities.  Psychiatric: Alert and oriented to person, place, and situation. Very pleasant and cooperative.     Labs on Admission: I have personally reviewed following labs and imaging  studies  CBC: Recent Labs  Lab 02/02/18 0456 02/03/18 0528 02/04/18 0339  WBC 8.3 7.7 7.1  HGB 12.9* 13.1 12.8*  HCT 38.2* 39.0 38.6*  MCV 105.2* 105.7* 104.6*  PLT 157 170 190   Basic Metabolic Panel: Recent Labs  Lab 02/02/18 0456 02/03/18 0528  NA 138 138  K 3.6 3.1*  CL 107 107  CO2 21* 21*  GLUCOSE 105* 113*  BUN 30* 23*  CREATININE 1.39* 1.22  CALCIUM 9.6 9.5   GFR: Estimated Creatinine Clearance: 54.6 mL/min (by C-G formula based on SCr of 1.22 mg/dL). Liver Function Tests: No results for input(s): AST, ALT, ALKPHOS, BILITOT, PROT, ALBUMIN in the last 168 hours. No results for input(s): LIPASE, AMYLASE in the last 168 hours. No results for input(s): AMMONIA in the last 168 hours. Coagulation Profile: No results for input(s): INR, PROTIME in the last 168 hours. Cardiac Enzymes: No results for input(s): CKTOTAL, CKMB, CKMBINDEX, TROPONINI in the last 168 hours. BNP (last 3 results)  No results for input(s): PROBNP in the last 8760 hours. HbA1C: No results for input(s): HGBA1C in the last 72 hours. CBG: No results for input(s): GLUCAP in the last 168 hours. Lipid Profile: No results for input(s): CHOL, HDL, LDLCALC, TRIG, CHOLHDL, LDLDIRECT in the last 72 hours. Thyroid Function Tests: No results for input(s): TSH, T4TOTAL, FREET4, T3FREE, THYROIDAB in the last 72 hours. Anemia Panel: No results for input(s): VITAMINB12, FOLATE, FERRITIN, TIBC, IRON, RETICCTPCT in the last 72 hours. Urine analysis:    Component Value Date/Time   COLORURINE YELLOW 02/22/2011 0905   APPEARANCEUR CLEAR 02/22/2011 0905   LABSPEC 1.010 02/22/2011 0905   PHURINE 6.5 02/22/2011 0905   GLUCOSEU NEGATIVE 02/22/2011 0905   HGBUR NEGATIVE 02/22/2011 0905   BILIRUBINUR NEGATIVE 02/22/2011 0905   KETONESUR NEGATIVE 02/22/2011 0905   PROTEINUR NEGATIVE 02/22/2011 0905   UROBILINOGEN 0.2 02/22/2011 0905   NITRITE NEGATIVE 02/22/2011 0905   LEUKOCYTESUR  02/22/2011 0905    NEGATIVE  MICROSCOPIC NOT DONE ON URINES WITH NEGATIVE PROTEIN, BLOOD, LEUKOCYTES, NITRITE, OR GLUCOSE <1000 mg/dL.   Sepsis Labs: (procalcitonin:4,lacticidven:4) )No results found for this or any previous visit (from the past 240 hour(s)).   Radiological Exams on Admission: No results found.  EKG: Independently reviewed. Atrial fibrillation, PVC's, T-wave inversions, QTc 522 ms.   Assessment/Plan  1. Acute CHF; acute hypoxic respiratory failure   - Presents with acute SOB following a syncopal episode overnight  - Found to be in acute CHF, requiring 10 Lpm to maintain sat of 90%  - Echo (02/01/18) with EF 60-65%, mild AR and MR, moderate LAE - Treated with Lasix 40 mg IV at outside hospital prior to admission  - Continue diuresis with Lasix 40 mg IV q12h, SLIV, follow daily wt and I/O's, consult with cardiology    2. NSTEMI  - Presents with SOB and found to have diffuse pulm edema with hypoxic resp failure  - Known occlusion of RCA on cath 4/19 with medical mgmt recommended  - Troponin 0.68 -> 0.81 at outside hospital just prior to admission  - Denies chest pain  - Continue cardiac monitoring, repeat EKG, trend troponin, continue anticoagulation, aspirin, beta-blocker, and statin, consult with cardiology     3. Atrial fibrillation  - In rate-controlled a fib on arrival  - CHADS-VASc at least 4 (age x2, CAD, HTN)  - Continue Eliquis and metoprolol    4. CKD stage III  - SCr 1.22 at outside hospital just prior to admission, better than priors  - Renally-dose medications, follow daily chem panel during diuresis   5. Hyperglycemia  - Serum glucose was 224 at outside hospital  - No hx of DM and no A1c on file  - Follow CBG's and use low-intensity SSI with Novolog as needed    6. Syncope  - Suffered a syncopal episode while getting up to use bathroom overnight; denies hitting head  - Likely cardiogenic, continue cardiac monitoring, consider repeat echo   DVT prophylaxis:  Eliquis  Code Status: Full  Family Communication: Family updated at bedside Consults called: Cardiology Admission status: Inpatient    Briscoe Deutscher, MD [facility] Pager [phone]  If 7PM-7AM, please contact night-coverage [email or web address] Password [facility]  02/08/2018, 10:11 PM

## 2018-02-08 NOTE — Telephone Encounter (Signed)
The patient's niece called the answering service after-hours today (listed on DPR - Amy). She called him to check on him this morning and he was notably SOB on the phone. He reported possibly passing out in the bathroom overnight, and then fell again in his bed and was unable to get up. He was able to call for a neighbor who helped get him to a chair. He's feeling slightly better but family wondering what to do. Complex cardiac history as noted, along with afib, bradycardia, NSVT. I advised they call EMS to have him evaluated ASAP as this could represent a number of acute issues, and he needs to be on a cardiac monitor and transported to the ED. Niece verbalized understanding and plans to do so immediately. Dayna Dunn PA-C

## 2018-02-08 NOTE — Progress Notes (Signed)
Patient transferred to 6e19 via [facility]. Pt able to move from stretcher to bed with minimi al assistance. He is awake, oriented, denies pain. Is having SOB on exertion. Afib in 90-100s on our monitor. All other VSS. MD on call paged. Will continue to monitor.   Family present and updated.

## 2018-02-09 ENCOUNTER — Inpatient Hospital Stay (HOSPITAL_COMMUNITY): Payer: Medicare Other

## 2018-02-09 ENCOUNTER — Other Ambulatory Visit: Payer: Self-pay

## 2018-02-09 DIAGNOSIS — N183 Chronic kidney disease, stage 3 (moderate): Secondary | ICD-10-CM

## 2018-02-09 DIAGNOSIS — J9601 Acute respiratory failure with hypoxia: Secondary | ICD-10-CM

## 2018-02-09 DIAGNOSIS — J810 Acute pulmonary edema: Secondary | ICD-10-CM

## 2018-02-09 DIAGNOSIS — Z7189 Other specified counseling: Secondary | ICD-10-CM

## 2018-02-09 LAB — COMPREHENSIVE METABOLIC PANEL
ALBUMIN: 2.5 g/dL — AB (ref 3.5–5.0)
ALT: 22 U/L (ref 17–63)
AST: 27 U/L (ref 15–41)
Alkaline Phosphatase: 83 U/L (ref 38–126)
Anion gap: 9 (ref 5–15)
BILIRUBIN TOTAL: 1.2 mg/dL (ref 0.3–1.2)
BUN: 19 mg/dL (ref 6–20)
CO2: 27 mmol/L (ref 22–32)
Calcium: 9.2 mg/dL (ref 8.9–10.3)
Chloride: 103 mmol/L (ref 101–111)
Creatinine, Ser: 1.25 mg/dL — ABNORMAL HIGH (ref 0.61–1.24)
GFR calc Af Amer: >60 mL/min (ref >60)
GFR calc non Af Amer: 53 mL/min — ABNORMAL LOW (ref >60)
GLUCOSE: 110 mg/dL — AB (ref 65–99)
POTASSIUM: 3.1 mmol/L — AB (ref 3.5–5.1)
Sodium: 139 mmol/L (ref 135–145)
TOTAL PROTEIN: 6.1 g/dL — AB (ref 6.5–8.1)

## 2018-02-09 LAB — CBC WITH DIFFERENTIAL/PLATELET
Basophils Absolute: 0 10*3/uL (ref 0.0–0.1)
Basophils Relative: 0 %
EOS ABS: 0.1 10*3/uL (ref 0.0–0.7)
Eosinophils Relative: 1 %
HCT: 39.1 % (ref 39.0–52.0)
HEMOGLOBIN: 13.2 g/dL (ref 13.0–17.0)
LYMPHS ABS: 0.6 10*3/uL — AB (ref 0.7–4.0)
LYMPHS PCT: 6 %
MCH: 34.4 pg — AB (ref 26.0–34.0)
MCHC: 33.8 g/dL (ref 30.0–36.0)
MCV: 101.8 fL — AB (ref 78.0–100.0)
Monocytes Absolute: 0.8 10*3/uL (ref 0.1–1.0)
Monocytes Relative: 7 %
NEUTROS PCT: 86 %
Neutro Abs: 9.7 10*3/uL — ABNORMAL HIGH (ref 1.7–7.7)
Platelets: 291 10*3/uL (ref 150–400)
RBC: 3.84 MIL/uL — AB (ref 4.22–5.81)
RDW: 14.1 % (ref 11.5–15.5)
WBC: 11.3 10*3/uL — AB (ref 4.0–10.5)

## 2018-02-09 LAB — BASIC METABOLIC PANEL
Anion gap: 15 (ref 5–15)
BUN: 21 mg/dL — AB (ref 6–20)
CO2: 25 mmol/L (ref 22–32)
Calcium: 9 mg/dL (ref 8.9–10.3)
Chloride: 101 mmol/L (ref 101–111)
Creatinine, Ser: 1.37 mg/dL — ABNORMAL HIGH (ref 0.61–1.24)
GFR calc Af Amer: 55 mL/min — ABNORMAL LOW (ref >60)
GFR, EST NON AFRICAN AMERICAN: 47 mL/min — AB (ref >60)
GLUCOSE: 141 mg/dL — AB (ref 65–99)
POTASSIUM: 3.4 mmol/L — AB (ref 3.5–5.1)
Sodium: 141 mmol/L (ref 135–145)

## 2018-02-09 LAB — HEMOGLOBIN A1C
HEMOGLOBIN A1C: 5.4 % (ref 4.8–5.6)
Mean Plasma Glucose: 108.28 mg/dL

## 2018-02-09 LAB — TROPONIN I
TROPONIN I: 1.19 ng/mL — AB (ref <0.03)
Troponin I: 1.09 ng/mL (ref <0.03)

## 2018-02-09 LAB — GLUCOSE, CAPILLARY
GLUCOSE-CAPILLARY: 115 mg/dL — AB (ref 65–99)
Glucose-Capillary: 117 mg/dL — ABNORMAL HIGH (ref 65–99)
Glucose-Capillary: 112 mg/dL — ABNORMAL HIGH (ref 65–99)
Glucose-Capillary: 105 mg/dL — ABNORMAL HIGH (ref 65–99)
Glucose-Capillary: 133 mg/dL — ABNORMAL HIGH (ref 65–99)
Glucose-Capillary: 150 mg/dL — ABNORMAL HIGH (ref 65–99)

## 2018-02-09 LAB — MRSA PCR SCREENING: MRSA BY PCR: NEGATIVE

## 2018-02-09 LAB — MAGNESIUM: MAGNESIUM: 1.8 mg/dL (ref 1.7–2.4)

## 2018-02-09 LAB — PROCALCITONIN
Procalcitonin: 0.29 ng/mL
Procalcitonin: 0.32 ng/mL

## 2018-02-09 MED ORDER — POTASSIUM CHLORIDE CRYS ER 20 MEQ PO TBCR
40.0000 meq | EXTENDED_RELEASE_TABLET | Freq: Two times a day (BID) | ORAL | Status: DC
  Administered 2018-02-09: 40 meq via ORAL
  Filled 2018-02-09 (×2): qty 2

## 2018-02-09 MED ORDER — FUROSEMIDE 10 MG/ML IJ SOLN
40.0000 mg | Freq: Once | INTRAMUSCULAR | Status: AC
  Administered 2018-02-09: 40 mg via INTRAVENOUS
  Filled 2018-02-09: qty 4

## 2018-02-09 MED ORDER — METHYLPREDNISOLONE SODIUM SUCC 40 MG IJ SOLR
40.0000 mg | Freq: Two times a day (BID) | INTRAMUSCULAR | Status: DC
  Administered 2018-02-09: 40 mg via INTRAVENOUS
  Filled 2018-02-09: qty 1

## 2018-02-09 MED ORDER — POTASSIUM CHLORIDE CRYS ER 20 MEQ PO TBCR
40.0000 meq | EXTENDED_RELEASE_TABLET | Freq: Once | ORAL | Status: DC
  Filled 2018-02-09: qty 2

## 2018-02-09 MED ORDER — IPRATROPIUM-ALBUTEROL 0.5-2.5 (3) MG/3ML IN SOLN
3.0000 mL | Freq: Four times a day (QID) | RESPIRATORY_TRACT | Status: DC
  Administered 2018-02-09 – 2018-02-13 (×15): 3 mL via RESPIRATORY_TRACT
  Filled 2018-02-09 (×15): qty 3

## 2018-02-09 MED ORDER — POTASSIUM CHLORIDE 10 MEQ/100ML IV SOLN
10.0000 meq | INTRAVENOUS | Status: AC
  Administered 2018-02-09 (×3): 10 meq via INTRAVENOUS
  Filled 2018-02-09: qty 100

## 2018-02-09 MED ORDER — MAGNESIUM SULFATE 2 GM/50ML IV SOLN
2.0000 g | Freq: Once | INTRAVENOUS | Status: DC
  Filled 2018-02-09: qty 50

## 2018-02-09 NOTE — Progress Notes (Signed)
PROGRESS NOTE    Craig [name]  ZOX:[id number] DOB: 1935/08/06 DOA: 02/08/2018 PCP: Ignatius Specking, MD    Brief Narrative by " Dr Antionette Char":  Craig [name] is a 83 y.o. male with medical history significant for permanent atrial fibrillation on Eliquis, coronary artery disease with recent NSTEMI medically managed, and chronic kidney disease stage III, now presenting with shortness of breath and a syncopal episode overnight.  Patient had experienced chest pain approximately 2 weeks ago, eventually went into the ED 3 days later after pain had resolved, was found to have had NSTEMI with troponin reaching 11, underwent catheterization on 01/31/2018, noted to have RCA occlusion, and medical management was recommended.  He was discharged home on 02/04/2018, saw his cardiologist in clinic yesterday, seemed to be doing fairly well at that time, but suffered a syncopal episode early this morning when he got up to use the bathroom, and has been experiencing shortness of breath since that time.  He denies any chest pain.  Denies fevers, chills, or cough.  Reports new onset of bilateral ankle edema.  [place] ED Course: Upon arrival to the ED, patient is found to be afebrile, saturating in the 80s on room air, and vitals otherwise stable.  EKG features atrial fibrillation with T wave inversions.  Chemistry panel is notable for glucose of 224 and CBC features macrocytosis without anemia.  BNP was elevated to just over thousand and and troponin was elevated to 0.68.  A second troponin was obtained and elevated further to 0.81.  The patient was treated with 40 mg IV Lasix in the ED and supplemental oxygen.  Transfer to [facility] was arranged for admission to the stepdown unit for ongoing evaluation and management of acute pulmonary edema with hypoxic respiratory failure.  All     Assessment & Plan:   Principal Problem:   Acute pulmonary edema (HCC) Active Problems:   Coronary atherosclerosis of native  coronary artery   Permanent atrial fibrillation (HCC)   Non-STEMI (non-ST elevated myocardial infarction) (HCC)   Acute respiratory failure with hypoxia (HCC)   Hyperglycemia   CKD (chronic kidney disease), stage III (HCC)  1-Acute Hypoxic Respiratory Failure;  Multifactorial, CHF exacerbation Vs Pneumonitis. Chest  X-ray with fibrosis.  On IV lasix 40 mg IV BID. Gave extra dose of lasix this am 40 mg He has put out 2L urine  since lasix. He is now on BIPAP.  Repeated chest x ray pending.  X ry on admission showed possible pneumonitis. Added IV solumedrol.  CCM consulted.   2-N-STEMI;   Known occlusion of RCA on cath 4/19 with medical mgmt recommended  Cardiology following.   3-A fib; on metoprolol and eliquis.   4-CKD stage III  Monitor renal function on lasix.   5-Hypokalemia; replete orally.   Syncope; monitor on telemetry.      DVT prophylaxis: eliquis Code Status:  Full code.  Family Communication: care discussed with niece  Disposition Plan: remain inpatient.   Consultants:   Cardiology  CCM   Procedures: ECHO   Antimicrobials: none   Subjective: He denies worsening dyspnea.  denies chest pain.   Objective: Vitals:   02/09/18 0400 02/09/18 0518 02/09/18 0801 02/09/18 0827  BP:  132/79 (!) 142/73   Pulse: 93 (!) 129    Resp: (!) 25 16    Temp:    98.3 F (36.8 C)  TempSrc:    Oral  SpO2: 93% 97%  93%  Weight:  86 kg (189 lb  9.5 oz)    Height:        Intake/Output Summary (Last 24 hours) at 02/09/2018 0852 Last data filed at 02/09/2018 0800 Gross per 24 hour  Intake 120 ml  Output 2200 ml  Net -2080 ml   Filed Weights   02/08/18 2208 02/09/18 0518  Weight: 87.4 kg (192 lb 10.9 oz) 86 kg (189 lb 9.5 oz)    Examination:  General exam: Appears calm and comfortable  Respiratory system: bilateral crackles. Normal respiratory effort.  Cardiovascular system: S1 & S2 heard, RRR. No JVD, murmurs, rubs, gallops or clicks. No pedal  edema. Gastrointestinal system: Abdomen is nondistended, soft and nontender. No organomegaly or masses felt. Normal bowel sounds heard. Central nervous system: Alert and oriented. No focal neurological deficits. Extremities: Symmetric 5 x 5 power. Skin: No rashes, lesions or ulcers Psychiatry: Judgement and insight appear normal. Mood & affect appropriate.     Data Reviewed: I have personally reviewed following labs and imaging studies  CBC: Recent Labs  Lab 02/03/18 0528 02/04/18 0339 02/09/18 0449  WBC 7.7 7.1 11.3*  NEUTROABS  --   --  9.7*  HGB 13.1 12.8* 13.2  HCT 39.0 38.6* 39.1  MCV 105.7* 104.6* 101.8*  PLT 170 190 291   Basic Metabolic Panel: Recent Labs  Lab 02/03/18 0528 02/09/18 0449  NA 138 139  K 3.1* 3.1*  CL 107 103  CO2 21* 27  GLUCOSE 113* 110*  BUN 23* 19  CREATININE 1.22 1.25*  CALCIUM 9.5 9.2   GFR: Estimated Creatinine Clearance: 53.3 mL/min (A) (by C-G formula based on SCr of 1.25 mg/dL (H)). Liver Function Tests: Recent Labs  Lab 02/09/18 0449  AST 27  ALT 22  ALKPHOS 83  BILITOT 1.2  PROT 6.1*  ALBUMIN 2.5*   No results for input(s): LIPASE, AMYLASE in the last 168 hours. No results for input(s): AMMONIA in the last 168 hours. Coagulation Profile: No results for input(s): INR, PROTIME in the last 168 hours. Cardiac Enzymes: Recent Labs  Lab 02/08/18 2224 02/09/18 0449  TROPONINI 1.31* 1.19*   BNP (last 3 results) No results for input(s): PROBNP in the last 8760 hours. HbA1C: Recent Labs    02/09/18 0449  HGBA1C 5.4   CBG: Recent Labs  Lab 02/09/18 0019 02/09/18 0545 02/09/18 0733  GLUCAP 115* 117* 112*   Lipid Profile: No results for input(s): CHOL, HDL, LDLCALC, TRIG, CHOLHDL, LDLDIRECT in the last 72 hours. Thyroid Function Tests: No results for input(s): TSH, T4TOTAL, FREET4, T3FREE, THYROIDAB in the last 72 hours. Anemia Panel: No results for input(s): VITAMINB12, FOLATE, FERRITIN, TIBC, IRON, RETICCTPCT  in the last 72 hours. Sepsis Labs: Recent Labs  Lab 02/08/18 2224 02/09/18 0449  PROCALCITON 0.29 0.32  LATICACIDVEN 1.7  --     Recent Results (from the past 240 hour(s))  MRSA PCR Screening     Status: None   Collection Time: 02/09/18  5:30 AM  Result Value Ref Range Status   MRSA by PCR NEGATIVE NEGATIVE Final    Comment:        The GeneXpert MRSA Assay (FDA approved for NASAL specimens only), is one component of a comprehensive MRSA colonization surveillance program. It is not intended to diagnose MRSA infection nor to guide or monitor treatment for MRSA infections. Performed at [facility], [address], [place], [place] [address]          Radiology Studies: Dg Chest Port 1 View  Result Date: 02/08/2018 CLINICAL DATA:  Acute respiratory failure.  Hypoxia. EXAM: PORTABLE CHEST 1 VIEW COMPARISON:  01/30/2018 FINDINGS: Cardiac enlargement. Diffuse interstitial pattern throughout the lungs. Previous studies demonstrate an underlying interstitial pattern but changes appear more prominent today. This suggest developing edema or interstitial pneumonitis superimposed upon chronic fibrosis. Blunting of the right costophrenic angle may suggest a small effusion. No pneumothorax. Mediastinal contours appear intact. Calcification of the aorta. IMPRESSION: Increasing diffuse interstitial pattern to the lungs likely representing developing edema or interstitial pneumonitis superimposed upon chronic fibrosis. Cardiac enlargement. Aortic atherosclerosis. Electronically Signed   By: Burman Nieves M.D.   On: 02/08/2018 22:55        Scheduled Meds: . allopurinol  300 mg Oral QPM  . apixaban  5 mg Oral BID  . aspirin EC  81 mg Oral Daily  . atorvastatin  20 mg Oral q1800  . doxazosin  8 mg Oral Daily  . furosemide  40 mg Intravenous BID  . gemfibrozil  600 mg Oral BID  . insulin aspart  0-9 Units Subcutaneous Q4H  . metoprolol succinate  12.5 mg Oral Daily  .  potassium chloride  40 mEq Oral BID WC  . sodium chloride flush  3 mL Intravenous Q12H   Continuous Infusions: . sodium chloride       LOS: 1 day    Time spent: 35 minutes.     Alba Cory, MD [facility] Pager [phone]  If 7PM-7AM, please contact night-coverage [email or web address] Password [facility] 02/09/2018, 8:52 AM

## 2018-02-09 NOTE — Progress Notes (Signed)
Pt still requiring 100% non rebreather and lung sounds are still fine crackles throughout. Called RT and asked for pt to be placed on Bipap. WCTM closely.

## 2018-02-09 NOTE — Consult Note (Signed)
PULMONARY / CRITICAL CARE MEDICINE   Name: Craig [name] MRN: [id number] DOB: January 11, 1935    ADMISSION DATE:  02/08/2018 CONSULTATION DATE:  02/09/2018  REFERRING MD:  [facility] - Regalado  CHIEF COMPLAINT:  Acute hypoxemic respiratory failure  HISTORY OF PRESENT ILLNESS:   83 year old male with recent MI and cath that showed complete RCA occlusion who was not feeling well for 5 days prior to having a syncopal episode and the day of admission.  Patient was brought to the ER where he was diagnosed with a NSTEMI.  Patient is on BiPAP and unable to provide history.  He is with his sister in law who is his DPOA.  Felt SOB on the day he fell, no cough, sputum production, fever, home O2 or inhaler use.  He does not carry the diagnosis of COPD and has no signs of pneumonia.  PAST MEDICAL HISTORY :  He  has a past medical history of Arthritis, Coronary atherosclerosis of native coronary artery, Essential hypertension, benign, Mixed hyperlipidemia, and Permanent atrial fibrillation (HCC).  PAST SURGICAL HISTORY: He  has a past surgical history that includes Joint replacement; Microdiscectomy lumbar (07/21/2012); Lumbar laminectomy (07/21/2012); LEFT HEART CATH AND CORONARY ANGIOGRAPHY (N/A, 01/31/2018); and Ultrasound guidance for vascular access (01/31/2018).  No Known Allergies  No current facility-administered medications on file prior to encounter.    Current Outpatient Medications on File Prior to Encounter  Medication Sig  . allopurinol (ZYLOPRIM) 300 MG tablet Take 300 mg by mouth every evening.   Marland Kitchen amLODipine (NORVASC) 5 MG tablet Take 5 mg by mouth every evening.   Marland Kitchen apixaban (ELIQUIS) 5 MG TABS tablet Take 1 tablet (5 mg total) by mouth 2 (two) times daily.  Marland Kitchen aspirin EC 81 MG EC tablet Take 1 tablet (81 mg total) by mouth daily.  Marland Kitchen atorvastatin (LIPITOR) 20 MG tablet Take 1 tablet (20 mg total) by mouth every evening.  Marland Kitchen doxazosin (CARDURA) 8 MG tablet Take 8 mg by mouth daily.   . Emollient  (DERMEND BRUISE FORMULA) CREA Apply 1 application topically as needed.  . furosemide (LASIX) 20 MG tablet Take 20 mg by mouth daily.   Marland Kitchen gemfibrozil (LOPID) 600 MG tablet Take 600 mg by mouth 2 (two) times daily.   . metoprolol succinate (TOPROL XL) 25 MG 24 hr tablet Take 0.5 tablets (12.5 mg total) by mouth daily.  . sildenafil (REVATIO) 20 MG tablet Take 40-60 mg by mouth daily as needed.    FAMILY HISTORY:  His has no family status information on file.    SOCIAL HISTORY: He  reports that he quit smoking about 35 years ago. His smoking use included cigarettes. He has a 30.00 pack-year smoking history. He has never used smokeless tobacco. He reports that he drinks about 8.4 oz of alcohol per week. He reports that he does not use drugs.  REVIEW OF SYSTEMS:   Unattainable, patient is incoherent on the BiPAP  SUBJECTIVE:  Progressive SOB improved with BiPAP  VITAL SIGNS: BP 115/72 (BP Location: Right Arm)   Pulse (!) 108   Temp 98.3 F (36.8 C)   Resp (!) 29   Ht  (1.854 m)   Wt 189 lb 9.5 oz (86 kg)   SpO2 98%   BMI 25.01 kg/m   HEMODYNAMICS:    VENTILATOR SETTINGS: FiO2 (%):  [100 %] 100 %  INTAKE / OUTPUT: I/O last 3 completed shifts: In: 120 [P.O.:120] Out: 1800 [Urine:1800]  PHYSICAL EXAMINATION: General:  Acutely ill  appearing male, moderate respiratory distress on BiPAP Neuro:  Arousable, moving all ext to command HEENT:  [place]/AT, PERRL, EOM-I and MMM Cardiovascular:  IRIR, Nl S1/S2 and -M/R/G. Lungs:  Diffuse crackles in all lung fields anteriorly Abdomen:  Soft, NT, ND and +BS Musculoskeletal:  -edema and -tenderness Skin:  Thin but intact  LABS:  BMET Recent Labs  Lab 02/03/18 0528 02/09/18 0449  NA 138 139  K 3.1* 3.1*  CL 107 103  CO2 21* 27  BUN 23* 19  CREATININE 1.22 1.25*  GLUCOSE 113* 110*    Electrolytes Recent Labs  Lab 02/03/18 0528 02/09/18 0449  CALCIUM 9.5 9.2    CBC Recent Labs  Lab 02/03/18 0528 02/04/18 0339  02/09/18 0449  WBC 7.7 7.1 11.3*  HGB 13.1 12.8* 13.2  HCT 39.0 38.6* 39.1  PLT 170 190 291    Coag's No results for input(s): APTT, INR in the last 168 hours.  Sepsis Markers Recent Labs  Lab 02/08/18 2224 02/09/18 0449  LATICACIDVEN 1.7  --   PROCALCITON 0.29 0.32    ABG No results for input(s): PHART, PCO2ART, PO2ART in the last 168 hours.  Liver Enzymes Recent Labs  Lab 02/09/18 0449  AST 27  ALT 22  ALKPHOS 83  BILITOT 1.2  ALBUMIN 2.5*    Cardiac Enzymes Recent Labs  Lab 02/08/18 2224 02/09/18 0449 02/09/18 1015  TROPONINI 1.31* 1.19* 1.09*    Glucose Recent Labs  Lab 02/09/18 0019 02/09/18 0545 02/09/18 0733 02/09/18 1108  GLUCAP 115* 117* 112* 105*    Imaging Dg Chest Port 1 View  Result Date: 02/08/2018 CLINICAL DATA:  Acute respiratory failure.  Hypoxia. EXAM: PORTABLE CHEST 1 VIEW COMPARISON:  01/30/2018 FINDINGS: Cardiac enlargement. Diffuse interstitial pattern throughout the lungs. Previous studies demonstrate an underlying interstitial pattern but changes appear more prominent today. This suggest developing edema or interstitial pneumonitis superimposed upon chronic fibrosis. Blunting of the right costophrenic angle may suggest a small effusion. No pneumothorax. Mediastinal contours appear intact. Calcification of the aorta. IMPRESSION: Increasing diffuse interstitial pattern to the lungs likely representing developing edema or interstitial pneumonitis superimposed upon chronic fibrosis. Cardiac enlargement. Aortic atherosclerosis. Electronically Signed   By: Burman Nieves M.D.   On: 02/08/2018 22:55     STUDIES:  CXR that I reviewed myself from 4/28 with R>L pulmonary edema  CULTURES: None  ANTIBIOTICS: None  SIGNIFICANT EVENTS: 4/28 CC consult for respiratory failure requiring BiPAP  LINES/TUBES: PIV  DISCUSSION: 83 year old male with recent MI and now with a NSTEMI in respiratory failure from pulmonary edema.  Discussed  with PCCM-NP and RT.  ASSESSMENT / PLAN:  PULMONARY A: Acute respiratory failure from pulmonary edema P:   - BiPAP - NPO - No evidence of COPD, will stop steroids - Lasix 40 mg IV BID - Full code status, discussed at length with his sister in law who is his power of attorney.  Her daughter is a PA with cardiology that is driving on her down now from [place] to be here tonight and they will then make the decision about DNR vs full code.  She knows he would not want to stay on a ventilator but for now full code and will discuss code status tonight.  CARDIOVASCULAR A:  A fib NSTEMI P:  - Tele monitoring - Beta blockers - No interventions - Interrogate monitor per cards  RENAL A:   Cr up to 1.25 P:   - Lasix 40 mg IV BID - BMET in  AM - Replace K  GASTROINTESTINAL A:   No active issues P:   - NPO  HEMATOLOGIC A:   No active issues P:  - CBC in AM - Target Hg of 8 given MI  INFECTIOUS A:   No sign of active infection P:   - Monitor WBC and fever curve  ENDOCRINE A:   Hyperglycemia   P:   - D/C steroids - CBGs - ISS - Monitor while NPO  NEUROLOGIC A:   No active issues P:   RASS goal: N/A - Minimize sedating agents - Follow clinically  FAMILY  - Updates: Full code status, discussed at length with his sister in law who is his power of attorney.  Her daughter is a PA with cardiology that is driving on her down now from [place] to be here tonight and they will then make the decision about DNR vs full code.  She knows he would not want to stay on a ventilator but for now full code and will discuss code status tonight.  - Inter-disciplinary family meet or Palliative Care meeting due by:  day 7  The patient is critically ill with multiple organ systems failure and requires high complexity decision making for assessment and support, frequent evaluation and titration of therapies, application of advanced monitoring technologies and extensive interpretation  of multiple databases.   Critical Care Time devoted to patient care services described in this note is  45  Minutes. This time reflects time of care of this signee Dr Koren Bound. This critical care time does not reflect procedure time, or teaching time or supervisory time of PA/NP/Med student/Med Resident etc but could involve care discussion time.  Alyson Reedy, M.D. [facility] Pulmonary/Critical Care Medicine. Pager: [phone]. After hours pager: [phone].  02/09/2018, 3:41 PM

## 2018-02-09 NOTE — Progress Notes (Signed)
Tried to trial pt off of Bipap long enough to take PO meds. Pt was not able to keep Sat above 89% on non-rebreather. Placed pt back on bipap and requested IV K.

## 2018-02-09 NOTE — Progress Notes (Signed)
Subjective:  The patient was admitted last night with 2 syncopal episodes.  His family says that he lives alone and had a syncopal episode when returning or going to the bathroom.  He evidently laid on the floor for over 3 hours and crawled to the telephone and had a second syncopal episode was eventually transferred here.  He is complaining of mild shortness of breath and has some mild dyspnea still.  No significant chest pain.  Troponins are mildly elevated but had event on the 18th so unclear if residual from that or not.  Objective:  Vital Signs in the last 24 hours: BP (!) 142/73   Pulse (!) 129   Temp 98.3 F (36.8 C) (Oral)   Resp 16   Ht  (1.854 m)   Wt 86 kg (189 lb 9.5 oz)   SpO2 93%   BMI 25.01 kg/m   Physical Exam: Elderly male currently on nonrebreathing mask Lungs:  Reduced breath sounds Cardiac:  Irregular rhythm, normal S1 and S2, no S3 Abdomen:  Soft, nontender, no masses Extremities:  trace edema present  Intake/Output from previous day: 04/27 0701 - 04/28 0700 In: 120 [P.O.:120] Out: 1800 [Urine:1800]  Weight Filed Weights   02/08/18 2208 02/09/18 0518  Weight: 87.4 kg (192 lb 10.9 oz) 86 kg (189 lb 9.5 oz)    Lab Results: Basic Metabolic Panel: Recent Labs    02/09/18 0449  NA 139  K 3.1*  CL 103  CO2 27  GLUCOSE 110*  BUN 19  CREATININE 1.25*   CBC: Recent Labs    02/09/18 0449  WBC 11.3*  NEUTROABS 9.7*  HGB 13.2  HCT 39.1  MCV 101.8*  PLT 291   Cardiac Panel (last 3 results) Recent Labs    02/08/18 2224 02/09/18 0449  TROPONINI 1.31* 1.19*    Telemetry: Atrial fibrillation somewhat rapid response  Assessment/Plan:  1.  Syncopal episode in the setting of recent ischemic event 2.  Dyspnea and possible heart failure 3.  Recentcatheterization with occlusion of the right coronary artery  Recommendations:  Continue telemetry.  Evaluate monitor strips because he was wearing a monitor at the time of the syncope to  determine if he had arrhythmia responsible for his syncope.  Likely will need to be done tomorrow.  Limited echocardiogram today.     Darden Palmer  MD [facility] Cardiology  02/09/2018, 11:06 AM

## 2018-02-09 NOTE — Progress Notes (Signed)
Pt has had two runs of Vtach this afternoon. Pt was asymptomatic. This RN made Dr Sunnie Nielsen aware and new orders for BMET with Mag given. WCTM closely and will alert Dr. Sunnie Nielsen with any abnormal results.

## 2018-02-10 ENCOUNTER — Inpatient Hospital Stay (HOSPITAL_COMMUNITY): Payer: Medicare Other

## 2018-02-10 DIAGNOSIS — I340 Nonrheumatic mitral (valve) insufficiency: Secondary | ICD-10-CM

## 2018-02-10 DIAGNOSIS — I351 Nonrheumatic aortic (valve) insufficiency: Secondary | ICD-10-CM

## 2018-02-10 LAB — CBC
HEMATOCRIT: 40.3 % (ref 39.0–52.0)
HEMOGLOBIN: 13.4 g/dL (ref 13.0–17.0)
MCH: 34.1 pg — AB (ref 26.0–34.0)
MCHC: 33.3 g/dL (ref 30.0–36.0)
MCV: 102.5 fL — AB (ref 78.0–100.0)
Platelets: 315 10*3/uL (ref 150–400)
RBC: 3.93 MIL/uL — AB (ref 4.22–5.81)
RDW: 14 % (ref 11.5–15.5)
WBC: 10 10*3/uL (ref 4.0–10.5)

## 2018-02-10 LAB — PROCALCITONIN: Procalcitonin: 0.32 ng/mL

## 2018-02-10 LAB — BASIC METABOLIC PANEL
ANION GAP: 11 (ref 5–15)
BUN: 32 mg/dL — AB (ref 6–20)
CHLORIDE: 103 mmol/L (ref 101–111)
CO2: 28 mmol/L (ref 22–32)
Calcium: 9.7 mg/dL (ref 8.9–10.3)
Creatinine, Ser: 1.38 mg/dL — ABNORMAL HIGH (ref 0.61–1.24)
GFR calc Af Amer: 54 mL/min — ABNORMAL LOW (ref >60)
GFR, EST NON AFRICAN AMERICAN: 47 mL/min — AB (ref >60)
GLUCOSE: 126 mg/dL — AB (ref 65–99)
POTASSIUM: 3.5 mmol/L (ref 3.5–5.1)
Sodium: 142 mmol/L (ref 135–145)

## 2018-02-10 LAB — GLUCOSE, CAPILLARY
GLUCOSE-CAPILLARY: 117 mg/dL — AB (ref 65–99)
Glucose-Capillary: 115 mg/dL — ABNORMAL HIGH (ref 65–99)
Glucose-Capillary: 130 mg/dL — ABNORMAL HIGH (ref 65–99)
Glucose-Capillary: 137 mg/dL — ABNORMAL HIGH (ref 65–99)
Glucose-Capillary: 142 mg/dL — ABNORMAL HIGH (ref 65–99)
Glucose-Capillary: 195 mg/dL — ABNORMAL HIGH (ref 65–99)

## 2018-02-10 LAB — BRAIN NATRIURETIC PEPTIDE: B NATRIURETIC PEPTIDE 5: 647.3 pg/mL — AB (ref 0.0–100.0)

## 2018-02-10 LAB — ECHOCARDIOGRAM LIMITED
HEIGHTINCHES: 73 in
Weight: 2924.18 oz

## 2018-02-10 LAB — MAGNESIUM: MAGNESIUM: 2.4 mg/dL (ref 1.7–2.4)

## 2018-02-10 MED ORDER — WHITE PETROLATUM EX OINT
TOPICAL_OINTMENT | CUTANEOUS | Status: AC
  Administered 2018-02-10: 05:00:00
  Filled 2018-02-10: qty 28.35

## 2018-02-10 MED ORDER — FUROSEMIDE 10 MG/ML IJ SOLN
40.0000 mg | Freq: Three times a day (TID) | INTRAMUSCULAR | Status: DC
  Administered 2018-02-10 – 2018-02-11 (×3): 40 mg via INTRAVENOUS
  Filled 2018-02-10 (×3): qty 4

## 2018-02-10 MED ORDER — METHYLPREDNISOLONE SODIUM SUCC 125 MG IJ SOLR
60.0000 mg | Freq: Four times a day (QID) | INTRAMUSCULAR | Status: DC
  Administered 2018-02-10 – 2018-02-11 (×4): 60 mg via INTRAVENOUS
  Filled 2018-02-10 (×4): qty 2

## 2018-02-10 MED ORDER — POTASSIUM CHLORIDE CRYS ER 20 MEQ PO TBCR
40.0000 meq | EXTENDED_RELEASE_TABLET | Freq: Two times a day (BID) | ORAL | Status: DC
  Administered 2018-02-10 – 2018-02-13 (×7): 40 meq via ORAL
  Filled 2018-02-10 (×7): qty 2

## 2018-02-10 NOTE — Progress Notes (Signed)
Patietn transported from [phone] to CT and back without any complications.

## 2018-02-10 NOTE — Progress Notes (Signed)
PROGRESS NOTE    Craig [name]  ZOX:[id number] DOB: 08-10-1935 DOA: 02/08/2018 PCP: Ignatius Specking, MD    Brief Narrative by " Dr Antionette Char":  Craig [name] is a 83 y.o. male with medical history significant for permanent atrial fibrillation on Eliquis, coronary artery disease with recent NSTEMI medically managed, and chronic kidney disease stage III, now presenting with shortness of breath and a syncopal episode overnight.  Patient had experienced chest pain approximately 2 weeks ago, eventually went into the ED 3 days later after pain had resolved, was found to have had NSTEMI with troponin reaching 11, underwent catheterization on 01/31/2018, noted to have RCA occlusion, and medical management was recommended.  He was discharged home on 02/04/2018, saw his cardiologist in clinic yesterday, seemed to be doing fairly well at that time, but suffered a syncopal episode early this morning when he got up to use the bathroom, and has been experiencing shortness of breath since that time.  He denies any chest pain.  Denies fevers, chills, or cough.  Reports new onset of bilateral ankle edema.  [place] ED Course: Upon arrival to the ED, patient is found to be afebrile, saturating in the 80s on room air, and vitals otherwise stable.  EKG features atrial fibrillation with T wave inversions.  Chemistry panel is notable for glucose of 224 and CBC features macrocytosis without anemia.  BNP was elevated to just over thousand and and troponin was elevated to 0.68.  A second troponin was obtained and elevated further to 0.81.  The patient was treated with 40 mg IV Lasix in the ED and supplemental oxygen.  Transfer to [facility] was arranged for admission to the stepdown unit for ongoing evaluation and management of acute pulmonary edema with hypoxic respiratory failure.  All     Assessment & Plan:   Principal Problem:   Acute pulmonary edema (HCC) Active Problems:   Coronary atherosclerosis of native  coronary artery   Permanent atrial fibrillation (HCC)   Non-STEMI (non-ST elevated myocardial infarction) (HCC)   Acute respiratory failure with hypoxia (HCC)   Hyperglycemia   CKD (chronic kidney disease), stage III (HCC)  1-Acute Hypoxic Respiratory Failure; related to diastolic HF CHF exacerbation Vs Pneumonitis. Chest  X-ray with fibrosis ?Marland Kitchen  Continue with lasix 40 mg IV TID.  Continue to be on BIPAP.  Chest x ray with persistent infiltrates.  X ry on admission showed possible pneumonitis. Added IV solumedrol. Discontinue by CCM.  No leukocytosis, pro-calcitonin less than 0.5.  CCM following.  Weight 192--182  2-N-STEMI;   Known occlusion of RCA on cath 4/19 with medical mgmt recommended  Cardiology following.  Limited ECHO EF 50 %  3-A fib; on metoprolol and eliquis.   4-CKD stage III  Monitor renal function on lasix.   5-Hypokalemia; replete orally.   Syncope; monitor on telemetry.      DVT prophylaxis: eliquis Code Status:  Full code.  Family Communication: care discussed with niece  Disposition Plan: remain inpatient.   Consultants:   Cardiology  CCM   Procedures: ECHO   Antimicrobials: none   Subjective: He is ok, still needing BIPAP. He is thirsty   Objective: Vitals:   02/10/18 0254 02/10/18 0457 02/10/18 0500 02/10/18 0744  BP:  115/68 115/68 118/62  Pulse: (!) 103 96 (!) 105 99  Resp: (!) 22 (!) 21 (!) 21 19  Temp:  97.9 F (36.6 C)  98 F (36.7 C)  TempSrc:  Oral  Oral  SpO2: 96%  92% 92% 91%  Weight:  82.9 kg (182 lb 12.2 oz)    Height:        Intake/Output Summary (Last 24 hours) at 02/10/2018 0851 Last data filed at 02/10/2018 0458 Gross per 24 hour  Intake 3 ml  Output 2750 ml  Net -2747 ml   Filed Weights   02/08/18 2208 02/09/18 0518 02/10/18 0457  Weight: 87.4 kg (192 lb 10.9 oz) 86 kg (189 lb 9.5 oz) 82.9 kg (182 lb 12.2 oz)    Examination:  General exam: NAD on BIPAP Respiratory system: Bilateral crackles.    Cardiovascular system: S 1 S 2 IRR Gastrointestinal system:  BS present, soft, nt Central nervous system: alert Extremities: Symmetric power.  Skin: No rash    Data Reviewed: I have personally reviewed following labs and imaging studies  CBC: Recent Labs  Lab 02/04/18 0339 02/09/18 0449 02/10/18 0453  WBC 7.1 11.3* 10.0  NEUTROABS  --  9.7*  --   HGB 12.8* 13.2 13.4  HCT 38.6* 39.1 40.3  MCV 104.6* 101.8* 102.5*  PLT 190 291 315   Basic Metabolic Panel: Recent Labs  Lab 02/09/18 0449 02/09/18 1702 02/10/18 0453  NA 139 141 142  K 3.1* 3.4* 3.5  CL 103 101 103  CO2 GLUCOSE 110* 141* 126*  BUN 19 21* 32*  CREATININE 1.25* 1.37* 1.38*  CALCIUM 9.2 9.0 9.7  MG  --  1.8 2.4   GFR: Estimated Creatinine Clearance: 48.2 mL/min (A) (by C-G formula based on SCr of 1.38 mg/dL (H)). Liver Function Tests: Recent Labs  Lab 02/09/18 0449  AST 27  ALT 22  ALKPHOS 83  BILITOT 1.2  PROT 6.1*  ALBUMIN 2.5*   No results for input(s): LIPASE, AMYLASE in the last 168 hours. No results for input(s): AMMONIA in the last 168 hours. Coagulation Profile: No results for input(s): INR, PROTIME in the last 168 hours. Cardiac Enzymes: Recent Labs  Lab 02/08/18 2224 02/09/18 0449 02/09/18 1015  TROPONINI 1.31* 1.19* 1.09*   BNP (last 3 results) No results for input(s): PROBNP in the last 8760 hours. HbA1C: Recent Labs    02/09/18 0449  HGBA1C 5.4   CBG: Recent Labs  Lab 02/09/18 1619 02/09/18 2003 02/10/18 0041 02/10/18 0456 02/10/18 0746  GLUCAP 133* 150* 142* 137* 115*   Lipid Profile: No results for input(s): CHOL, HDL, LDLCALC, TRIG, CHOLHDL, LDLDIRECT in the last 72 hours. Thyroid Function Tests: No results for input(s): TSH, T4TOTAL, FREET4, T3FREE, THYROIDAB in the last 72 hours. Anemia Panel: No results for input(s): VITAMINB12, FOLATE, FERRITIN, TIBC, IRON, RETICCTPCT in the last 72 hours. Sepsis Labs: Recent Labs  Lab 02/08/18 2224  02/09/18 0449 02/10/18 0453  PROCALCITON 0.29 0.32 0.32  LATICACIDVEN 1.7  --   --     Recent Results (from the past 240 hour(s))  MRSA PCR Screening     Status: None   Collection Time: 02/09/18  5:30 AM  Result Value Ref Range Status   MRSA by PCR NEGATIVE NEGATIVE Final    Comment:        The GeneXpert MRSA Assay (FDA approved for NASAL specimens only), is one component of a comprehensive MRSA colonization surveillance program. It is not intended to diagnose MRSA infection nor to guide or monitor treatment for MRSA infections. Performed at [facility], [address] [place], [place] [address]          Radiology Studies: Dg Chest [facility] 1 View  Result  Date: 02/10/2018 CLINICAL DATA:  Hypoxemia EXAM: PORTABLE CHEST 1 VIEW COMPARISON:  02/09/2018 FINDINGS: Diffuse airspace disease and interstitial prominence throughout the lungs, stable. Mild cardiomegaly. No visible effusions or acute bony abnormality. IMPRESSION: Diffuse interstitial and alveolar opacities throughout the lungs could reflect edema or infection, unchanged. Electronically Signed   By: Charlett Nose M.D.   On: 02/10/2018 08:27   Dg Chest Port 1 View  Result Date: 02/09/2018 CLINICAL DATA:  Shortness of breath.  Hypoxemia. EXAM: PORTABLE CHEST 1 VIEW COMPARISON:  February 08, 2018 FINDINGS: Diffuse interstitial opacities throughout the right lung and the left mid and lower lung. No other interval changes. IMPRESSION: Diffuse pulmonary opacities, right greater than left.  No change. Electronically Signed   By: Gerome Sam III M.D   On: 02/09/2018 15:48   Dg Chest Port 1 View  Result Date: 02/08/2018 CLINICAL DATA:  Acute respiratory failure.  Hypoxia. EXAM: PORTABLE CHEST 1 VIEW COMPARISON:  01/30/2018 FINDINGS: Cardiac enlargement. Diffuse interstitial pattern throughout the lungs. Previous studies demonstrate an underlying interstitial pattern but changes appear more prominent today. This suggest  developing edema or interstitial pneumonitis superimposed upon chronic fibrosis. Blunting of the right costophrenic angle may suggest a small effusion. No pneumothorax. Mediastinal contours appear intact. Calcification of the aorta. IMPRESSION: Increasing diffuse interstitial pattern to the lungs likely representing developing edema or interstitial pneumonitis superimposed upon chronic fibrosis. Cardiac enlargement. Aortic atherosclerosis. Electronically Signed   By: Burman Nieves M.D.   On: 02/08/2018 22:55        Scheduled Meds: . allopurinol  300 mg Oral QPM  . apixaban  5 mg Oral BID  . aspirin EC  81 mg Oral Daily  . atorvastatin  20 mg Oral q1800  . doxazosin  8 mg Oral Daily  . furosemide  40 mg Intravenous BID  . gemfibrozil  600 mg Oral BID  . insulin aspart  0-9 Units Subcutaneous Q4H  . ipratropium-albuterol  3 mL Nebulization Q6H  . metoprolol succinate  12.5 mg Oral Daily  . potassium chloride  40 mEq Oral BID  . sodium chloride flush  3 mL Intravenous Q12H   Continuous Infusions: . sodium chloride    . magnesium sulfate 1 - 4 g bolus IVPB       LOS: 2 days    Time spent: 35 minutes.     Alba Cory, MD [facility] Pager [phone]  If 7PM-7AM, please contact night-coverage [email or web address] Password [facility] 02/10/2018, 8:51 AM

## 2018-02-10 NOTE — Discharge Instructions (Signed)

## 2018-02-10 NOTE — Progress Notes (Addendum)
PULMONARY / CRITICAL CARE MEDICINE   Name: Craig [name] MRN: [id number] DOB: 11/03/1934    ADMISSION DATE:  02/08/2018 CONSULTATION DATE:  02/09/2018  REFERRING MD:  [facility] - Regalado  CHIEF COMPLAINT:  Acute hypoxemic respiratory failure  HISTORY OF PRESENT ILLNESS:   83 year old male with recent MI and cath that showed complete RCA occlusion who was not feeling well for 5 days prior to having a syncopal episode and the day of admission.  Patient was brought to the ER where he was diagnosed with a NSTEMI.  Patient is on BiPAP and unable to provide history.  He is with his sister in law who is his DPOA.  Felt SOB on the day he fell, no cough, sputum production, fever, home O2 or inhaler use.  He does not carry the diagnosis of COPD and has no signs of pneumonia.  SUBJECTIVE:  Dyspnea improved on BiPAP with diuresis, but he has been unable to come off for any meaningful period of time over the past few days.   VITAL SIGNS: BP 118/62   Pulse 99   Temp 98 F (36.7 C) (Oral)   Resp 19   Ht  (1.854 m)   Wt 82.9 kg (182 lb 12.2 oz)   SpO2 91%   BMI 24.11 kg/m   HEMODYNAMICS:    VENTILATOR SETTINGS: FiO2 (%):  [60 %-100 %] 60 %  INTAKE / OUTPUT: I/O last 3 completed shifts: In: 153 [P.O.:150; I.V.:3] Out: 4950 [Urine:4950]  PHYSICAL EXAMINATION: General:  Acutely ill appearing male, NAD on BiPAP Neuro:  Alert and oriented. Non-focal.  HEENT:  [place]/AT, PERRL, EOM-I and MMM Cardiovascular:  IRIR, Rate controlled and no M/R/G. Lungs:  Diffuse crackles in all lung fields anteriorly Abdomen:  Soft, NT, ND and +BS Musculoskeletal:  -edema and -tenderness Skin:  Grossly intact. Stage 1 breakdown under BiPAP mask.   LABS:  BMET Recent Labs  Lab 02/09/18 0449 02/09/18 1702 02/10/18 0453  NA 139 141 142  K 3.1* 3.4* 3.5  CL 103 101 103  CO2 BUN 19 21* 32*  CREATININE 1.25* 1.37* 1.38*  GLUCOSE 110* 141* 126*    Electrolytes Recent Labs  Lab 02/09/18 0449  02/09/18 1702 02/10/18 0453  CALCIUM 9.2 9.0 9.7  MG  --  1.8 2.4    CBC Recent Labs  Lab 02/04/18 0339 02/09/18 0449 02/10/18 0453  WBC 7.1 11.3* 10.0  HGB 12.8* 13.2 13.4  HCT 38.6* 39.1 40.3  PLT 190 291 315    Coag's No results for input(s): APTT, INR in the last 168 hours.  Sepsis Markers Recent Labs  Lab 02/08/18 2224 02/09/18 0449 02/10/18 0453  LATICACIDVEN 1.7  --   --   PROCALCITON 0.29 0.32 0.32    ABG No results for input(s): PHART, PCO2ART, PO2ART in the last 168 hours.  Liver Enzymes Recent Labs  Lab 02/09/18 0449  AST 27  ALT 22  ALKPHOS 83  BILITOT 1.2  ALBUMIN 2.5*    Cardiac Enzymes Recent Labs  Lab 02/08/18 2224 02/09/18 0449 02/09/18 1015  TROPONINI 1.31* 1.19* 1.09*    Glucose Recent Labs  Lab 02/09/18 1108 02/09/18 1619 02/09/18 2003 02/10/18 0041 02/10/18 0456 02/10/18 0746  GLUCAP 105* 133* 150* 142* 137* 115*    Imaging Dg Chest Port 1 View  Result Date: 02/10/2018 CLINICAL DATA:  Hypoxemia EXAM: PORTABLE CHEST 1 VIEW COMPARISON:  02/09/2018 FINDINGS: Diffuse airspace disease and interstitial prominence throughout the lungs, stable. Mild  cardiomegaly. No visible effusions or acute bony abnormality. IMPRESSION: Diffuse interstitial and alveolar opacities throughout the lungs could reflect edema or infection, unchanged. Electronically Signed   By: Charlett Nose M.D.   On: 02/10/2018 08:27   Dg Chest Port 1 View  Result Date: 02/09/2018 CLINICAL DATA:  Shortness of breath.  Hypoxemia. EXAM: PORTABLE CHEST 1 VIEW COMPARISON:  February 08, 2018 FINDINGS: Diffuse interstitial opacities throughout the right lung and the left mid and lower lung. No other interval changes. IMPRESSION: Diffuse pulmonary opacities, right greater than left.  No change. Electronically Signed   By: Gerome Sam III M.D   On: 02/09/2018 15:48     STUDIES:  CXR reviewed by me: unchanged opacification  R>L  CULTURES: None  ANTIBIOTICS: None  SIGNIFICANT EVENTS: 4/28 CC consult for respiratory failure requiring BiPAP 4/29 remains on BiPAP  LINES/TUBES: PIV  DISCUSSION: 83 year old male with recent MI and now with a NSTEMI in respiratory failure from pulmonary edema. Suffered syncopal episode prior to re-admit. Wonder if this did not represent another cardiac event as he had a 100% RCA that was managed medically.  He was placed on BiPAP shortly after admission for hypoxia and has essentially been unable to come off for any meaningful period of time. He has been diuresing well and feeling better, but no radiographic change.   ASSESSMENT / PLAN:  Acute hypoxemic respiratory failure from pulmonary edema - CXR rather non-specific, but opacifications are dependent. No infectious prodrome. No clear rationale to suspect ILD, especially considering acuity of onset. He was not on amiodarone during his last admit, or seemingly on any other medications known to routinely cause pulmonary symptoms. He has Revatio listed in his home meds. He does not know what this is. PA peak pressures on recent echo 44%. Suspect this is pulmonary edema based on recent cardiac history and effective diuresis, despite recent normal echo. Especially considering syncope and acute on set dyspnea since time of that echo.   - BiPAP PRN - Will try heated high flow at 100% FiO2 and high flow rate with hopes of gaining some respite from BiPAP - If cannot tolerate high flow, will need to decide between continued BiPAP or intubation. He is ok with intubation assuming that it would be temporary, understanding that if echo shows very poor function (and other variables), it may not be temporary.  - CT chest if Echo doesn't describe worsening CHF - NPO except sips with meds.  - No evidence of COPD/fibrosis, will stop steroids - Aggressive diuresis per cardiology.  Atrial fibrillation NSTEMI with recent MI (100% RCA occlusion  managed medically) - Tele monitoring - Cardiology following - Strict I&O  Acute renal insufficiency - Lasix 40 mg IV BID - Follow BMP - Replace electrolytes as indicated.   FAMILY  - Updates: patient and family updated at length. They understand the severity of his condition and that there are still a few unknowns that may help clear up the picture a bit. They are OK with intubation if needed assuming it would be temporary, but are aware there is potential it may not be (for example if echo shows poor cardiac function)  Joneen Roach, AGACNP-BC [facility] Pulmonology/Critical Care Pager [phone] or [phone]  02/10/2018 10:09 AM

## 2018-02-10 NOTE — Progress Notes (Signed)
Bipap mask off briefly (2-3 mins) to perform oral care. Sats decreased to 77%. Pt recovered well, and in no distress. Niece at bedside. Will continue to monitor.

## 2018-02-10 NOTE — Progress Notes (Signed)
  Echocardiogram 2D Echocardiogram has been performed.  Craig [name] Craig [name] 02/10/2018, 1:11 PM

## 2018-02-10 NOTE — Progress Notes (Signed)
Progress Note  Patient Name: Craig [name] Date of Encounter: 02/10/2018  Primary Cardiologist: Nona Dell, MD   Subjective   Still quite dependent on BiPAP.  Positive for shortness of breath.  Denies chest pain.  Inpatient Medications    Scheduled Meds: . allopurinol  300 mg Oral QPM  . apixaban  5 mg Oral BID  . aspirin EC  81 mg Oral Daily  . atorvastatin  20 mg Oral q1800  . doxazosin  8 mg Oral Daily  . furosemide  40 mg Intravenous BID  . gemfibrozil  600 mg Oral BID  . insulin aspart  0-9 Units Subcutaneous Q4H  . ipratropium-albuterol  3 mL Nebulization Q6H  . metoprolol succinate  12.5 mg Oral Daily  . potassium chloride  40 mEq Oral BID  . sodium chloride flush  3 mL Intravenous Q12H   Continuous Infusions: . sodium chloride    . magnesium sulfate 1 - 4 g bolus IVPB     PRN Meds: sodium chloride, acetaminophen, ALPRAZolam, ondansetron (ZOFRAN) IV, sodium chloride flush   Vital Signs    Vitals:   02/10/18 0457 02/10/18 0500 02/10/18 0744 02/10/18 1026  BP: 115/68 115/68 118/62   Pulse: 96 (!) 105 99   Resp: (!) 21 (!) 21 19   Temp: 97.9 F (36.6 C)  98 F (36.7 C)   TempSrc: Oral  Oral   SpO2: 92% 92% 91% 97%  Weight: 182 lb 12.2 oz (82.9 kg)     Height:        Intake/Output Summary (Last 24 hours) at 02/10/2018 1118 Last data filed at 02/10/2018 0900 Gross per 24 hour  Intake 0 ml  Output 1950 ml  Net -1950 ml   Filed Weights   02/08/18 2208 02/09/18 0518 02/10/18 0457  Weight: 192 lb 10.9 oz (87.4 kg) 189 lb 9.5 oz (86 kg) 182 lb 12.2 oz (82.9 kg)    Telemetry    Atrial fibrillation heart rates 90-100, 2 episodes of nonsustained ventricular tachycardia approximately 12 beats- Personally Reviewed  ECG    A. fib with T wave inversion inferior lateral precordial leads- Personally Reviewed  Physical Exam   GEN:  Elderly, on high flow nasal cannula oxygen Neck: No JVD Cardiac:  Irregularly irregular, no murmurs, rubs, or gallops.    Respiratory:  Reduced breath sounds.  Soft crackles heard bilateral bases GI: Soft, nontender, non-distended  MS:  No edema; No deformity. Neuro:  Nonfocal  Psych: Normal affect   Labs    Chemistry Recent Labs  Lab 02/09/18 0449 02/09/18 1702 02/10/18 0453  NA 139 141 142  K 3.1* 3.4* 3.5  CL 103 101 103  CO2 GLUCOSE 110* 141* 126*  BUN 19 21* 32*  CREATININE 1.25* 1.37* 1.38*  CALCIUM 9.2 9.0 9.7  PROT 6.1*  --   --   ALBUMIN 2.5*  --   --   AST 27  --   --   ALT 22  --   --   ALKPHOS 83  --   --   BILITOT 1.2  --   --   GFRNONAA 53* 47* 47*  GFRAA >60 55* 54*  ANIONGAP Hematology Recent Labs  Lab 02/04/18 0339 02/09/18 0449 02/10/18 0453  WBC 7.1 11.3* 10.0  RBC 3.69* 3.84* 3.93*  HGB 12.8* 13.2 13.4  HCT 38.6* 39.1 40.3  MCV 104.6* 101.8* 102.5*  MCH 34.7* 34.4* 34.1*  MCHC 33.2  33.8 33.3  RDW 14.1 14.1 14.0  PLT 190 291 315    Cardiac Enzymes Recent Labs  Lab 02/08/18 2224 02/09/18 0449 02/09/18 1015  TROPONINI 1.31* 1.19* 1.09*   No results for input(s): TROPIPOC in the last 168 hours.   BNPNo results for input(s): BNP, PROBNP in the last 168 hours.   DDimer No results for input(s): DDIMER in the last 168 hours.   Radiology    Dg Chest Port 1 View  Result Date: 02/10/2018 CLINICAL DATA:  Hypoxemia EXAM: PORTABLE CHEST 1 VIEW COMPARISON:  02/09/2018 FINDINGS: Diffuse airspace disease and interstitial prominence throughout the lungs, stable. Mild cardiomegaly. No visible effusions or acute bony abnormality. IMPRESSION: Diffuse interstitial and alveolar opacities throughout the lungs could reflect edema or infection, unchanged. Electronically Signed   By: Charlett Nose M.D.   On: 02/10/2018 08:27   Dg Chest Port 1 View  Result Date: 02/09/2018 CLINICAL DATA:  Shortness of breath.  Hypoxemia. EXAM: PORTABLE CHEST 1 VIEW COMPARISON:  February 08, 2018 FINDINGS: Diffuse interstitial opacities throughout the right lung and the  left mid and lower lung. No other interval changes. IMPRESSION: Diffuse pulmonary opacities, right greater than left.  No change. Electronically Signed   By: Gerome Sam III M.D   On: 02/09/2018 15:48   Dg Chest Port 1 View  Result Date: 02/08/2018 CLINICAL DATA:  Acute respiratory failure.  Hypoxia. EXAM: PORTABLE CHEST 1 VIEW COMPARISON:  01/30/2018 FINDINGS: Cardiac enlargement. Diffuse interstitial pattern throughout the lungs. Previous studies demonstrate an underlying interstitial pattern but changes appear more prominent today. This suggest developing edema or interstitial pneumonitis superimposed upon chronic fibrosis. Blunting of the right costophrenic angle may suggest a small effusion. No pneumothorax. Mediastinal contours appear intact. Calcification of the aorta. IMPRESSION: Increasing diffuse interstitial pattern to the lungs likely representing developing edema or interstitial pneumonitis superimposed upon chronic fibrosis. Cardiac enlargement. Aortic atherosclerosis. Electronically Signed   By: Burman Nieves M.D.   On: 02/08/2018 22:55    Cardiac Studies   Echocardiogram 02/01/2018 - Left ventricle: Focal thinning and hypokinesis of the mid septum.   The cavity size was normal. There was mild focal basal   hypertrophy of the septum. Systolic function was normal. The   estimated ejection fraction was in the range of 60% to 65%. The   study is not technically sufficient to allow evaluation of LV   diastolic function. - Aortic valve: There was mild regurgitation. - Mitral valve: There was mild regurgitation. - Left atrium: The atrium was moderately dilated. - Atrial septum: There was increased thickness of the septum,   consistent with lipomatous hypertrophy. No defect or patent   foramen ovale was identified. - Pulmonary arteries: PA peak pressure: 44 mm Hg (S).  Patient Profile     83 y.o. male with acute hypoxic respiratory failure possibly secondary to pulmonary edema  versus infectious etiology versus chronic fibrosis with persistent atrial fibrillation  Assessment & Plan    Acute hypoxic respiratory failure - Recent ejection fraction 60 to 65%.  Repeat limited echocardiogram pending today.  Chest x-ray images personally reviewed, right greater than left opacities.  For about 4 days after going home after his MI, he felt well.  This edema came on quite suddenly.  No family members with the flu or colds.  He has not had any shaking chills.  No fevers.  It seems that this must be related to his recent heart incident, perhaps acute diastolic heart failure.  Prior to this, he  was not having any dyspnea on exertion. - Empirically continue with Lasix 40 mg IV 3 a day.  BUN and creatinine are starting to rise.  Weight is 182 down from 189.  He was out 3 L yesterday.  Continue again with IV Lasix. -Pulmonary notes reviewed.  Currently full code.  Daughter is a PA with cardiology in [place], helping to make decision.  Coronary artery disease - Recent heart catheterization demonstrating occlusion of the right coronary artery.  Medical management.  Persistent atrial fibrillation - Heart rate reasonably controlled.  Eliquis.  CHADSVASc at least 3  Syncope -Describes an episode around Christmas after getting up off of his recliner that was likely orthostasis.  Currently wearing a monitor.  Trying to obtain results of monitor.  Troponin is trending downward from previous event 10 days ago.  Lengthy discussion with family, nursing team in this gentleman with recent myocardial infarction, hypoxic respiratory failure, mildly rising creatinine.  Complex medical decision making.  For questions or updates, please contact [facility] Please consult [email or web address] for contact info under Cardiology/STEMI.      Signed, Donato Schultz, MD  02/10/2018, 11:18 AM

## 2018-02-11 LAB — BASIC METABOLIC PANEL
Anion gap: 10 (ref 5–15)
BUN: 46 mg/dL — AB (ref 6–20)
CO2: 28 mmol/L (ref 22–32)
Calcium: 10 mg/dL (ref 8.9–10.3)
Chloride: 104 mmol/L (ref 101–111)
Creatinine, Ser: 1.32 mg/dL — ABNORMAL HIGH (ref 0.61–1.24)
GFR calc Af Amer: 57 mL/min — ABNORMAL LOW (ref >60)
GFR, EST NON AFRICAN AMERICAN: 49 mL/min — AB (ref >60)
Glucose, Bld: 141 mg/dL — ABNORMAL HIGH (ref 65–99)
POTASSIUM: 3.9 mmol/L (ref 3.5–5.1)
Sodium: 142 mmol/L (ref 135–145)

## 2018-02-11 LAB — GLUCOSE, CAPILLARY
Glucose-Capillary: 134 mg/dL — ABNORMAL HIGH (ref 65–99)
Glucose-Capillary: 201 mg/dL — ABNORMAL HIGH (ref 65–99)
Glucose-Capillary: 130 mg/dL — ABNORMAL HIGH (ref 65–99)
Glucose-Capillary: 162 mg/dL — ABNORMAL HIGH (ref 65–99)
Glucose-Capillary: 232 mg/dL — ABNORMAL HIGH (ref 65–99)
Glucose-Capillary: 125 mg/dL — ABNORMAL HIGH (ref 65–99)

## 2018-02-11 LAB — MAGNESIUM: MAGNESIUM: 2.4 mg/dL (ref 1.7–2.4)

## 2018-02-11 MED ORDER — METHYLPREDNISOLONE SODIUM SUCC 125 MG IJ SOLR
125.0000 mg | Freq: Four times a day (QID) | INTRAMUSCULAR | Status: DC
  Administered 2018-02-11 – 2018-02-12 (×4): 125 mg via INTRAVENOUS
  Filled 2018-02-11 (×5): qty 2

## 2018-02-11 MED ORDER — MAGNESIUM HYDROXIDE 400 MG/5ML PO SUSP
30.0000 mL | Freq: Every day | ORAL | Status: DC | PRN
  Administered 2018-02-11 – 2018-02-25 (×2): 30 mL via ORAL
  Filled 2018-02-11 (×2): qty 30

## 2018-02-11 MED ORDER — FUROSEMIDE 10 MG/ML IJ SOLN
40.0000 mg | Freq: Once | INTRAMUSCULAR | Status: AC
Start: 2018-02-11 — End: 2018-02-11
  Administered 2018-02-11: 40 mg via INTRAVENOUS
  Filled 2018-02-11: qty 4

## 2018-02-11 NOTE — Plan of Care (Signed)
  Problem: Education: Goal: Knowledge of General Education information will improve Outcome: Completed/Met

## 2018-02-11 NOTE — Progress Notes (Signed)
Progress Note  Patient Name: Craig [name] Date of Encounter: 02/11/2018  Primary Cardiologist: Nona Dell, MD   Subjective   Continues with high flow nasal cannula oxygen.  Breathing may be slightly better, certainly no worse than yesterday.  Inpatient Medications    Scheduled Meds: . allopurinol  300 mg Oral QPM  . apixaban  5 mg Oral BID  . aspirin EC  81 mg Oral Daily  . atorvastatin  20 mg Oral q1800  . doxazosin  8 mg Oral Daily  . furosemide  40 mg Intravenous TID  . gemfibrozil  600 mg Oral BID  . insulin aspart  0-9 Units Subcutaneous Q4H  . ipratropium-albuterol  3 mL Nebulization Q6H  . methylPREDNISolone (SOLU-MEDROL) injection  60 mg Intravenous Q6H  . metoprolol succinate  12.5 mg Oral Daily  . potassium chloride  40 mEq Oral BID  . sodium chloride flush  3 mL Intravenous Q12H   Continuous Infusions: . sodium chloride    . magnesium sulfate 1 - 4 g bolus IVPB     PRN Meds: sodium chloride, acetaminophen, ALPRAZolam, ondansetron (ZOFRAN) IV, sodium chloride flush   Vital Signs    Vitals:   02/11/18 0433 02/11/18 0802 02/11/18 0826 02/11/18 0827  BP: 116/82 124/70    Pulse: 88 96    Resp: 19     Temp: 98 F (36.7 C) 97.6 F (36.4 C)    TempSrc:  Oral    SpO2: 93% 90% 91% 93%  Weight: 175 lb 0.7 oz (79.4 kg)     Height:        Intake/Output Summary (Last 24 hours) at 02/11/2018 1021 Last data filed at 02/11/2018 0544 Gross per 24 hour  Intake 1193 ml  Output 800 ml  Net 393 ml   Filed Weights   02/09/18 0518 02/10/18 0457 02/11/18 0433  Weight: 189 lb 9.5 oz (86 kg) 182 lb 12.2 oz (82.9 kg) 175 lb 0.7 oz (79.4 kg)    Telemetry    Atrial fibrillation under good rate control, transient bradycardia at 1 AM in the 30s, asymptomatic, transient irregularly irregular tachycardic rhythm with axis shift at 9 AM this morning- Personally Reviewed  ECG    No new EKG- Personally Reviewed  Physical Exam   GEN:  Mild increased respiratory  effort, mildly ill-appearing.   Neck: No JVD Cardiac:  Irregularly irregular, no murmurs, rubs, or gallops.  Respiratory:  Decreased breath sounds bilaterally. GI: Soft, nontender, non-distended  MS: No edema; No deformity. Neuro:  Nonfocal  Psych: Normal affect   Labs    Chemistry Recent Labs  Lab 02/09/18 0449 02/09/18 1702 02/10/18 0453 02/11/18 0504  NA 139 141 142 142  K 3.1* 3.4* 3.5 3.9  CL 103 101 103 104  CO2 GLUCOSE 110* 141* 126* 141*  BUN 19 21* 32* 46*  CREATININE 1.25* 1.37* 1.38* 1.32*  CALCIUM 9.2 9.0 9.7 10.0  PROT 6.1*  --   --   --   ALBUMIN 2.5*  --   --   --   AST 27  --   --   --   ALT 22  --   --   --   ALKPHOS 83  --   --   --   BILITOT 1.2  --   --   --   GFRNONAA 53* 47* 47* 49*  GFRAA >60 55* 54* 57*  ANIONGAP Hematology Recent  Labs  Lab 02/09/18 0449 02/10/18 0453  WBC 11.3* 10.0  RBC 3.84* 3.93*  HGB 13.2 13.4  HCT 39.1 40.3  MCV 101.8* 102.5*  MCH 34.4* 34.1*  MCHC 33.8 33.3  RDW 14.1 14.0  PLT 291 315    Cardiac Enzymes Recent Labs  Lab 02/08/18 2224 02/09/18 0449 02/09/18 1015  TROPONINI 1.31* 1.19* 1.09*   No results for input(s): TROPIPOC in the last 168 hours.   BNP Recent Labs  Lab 02/10/18 0453  BNP 647.3*     DDimer No results for input(s): DDIMER in the last 168 hours.   Radiology    Ct Chest Wo Contrast  Result Date: 02/10/2018 CLINICAL DATA:  Interstitial lung disease. EXAM: CT CHEST WITHOUT CONTRAST TECHNIQUE: Multidetector CT imaging of the chest was performed following the standard protocol without IV contrast. COMPARISON:  Multiple chest x-rays since July 15, 2012 FINDINGS: Cardiovascular: Atherosclerotic change in the nonaneurysmal aorta. The central pulmonary arteries are normal in caliber. Coronary artery calcifications are noted. Mild cardiomegaly. Mediastinum/Nodes: Small right pleural effusion. No pericardial effusion. The esophagus and thyroid is normal. No  adenopathy in the chest. Lungs/Pleura: Central airways are normal. No pneumothorax. There are some emphysematous changes in the lungs. Focal pulmonary infiltrates are seen bilaterally. The right infiltrates are most prominent in the right perihilar region, involving the upper and lower lobe. The infiltrate on the left is most prominent in the left lower lobe and posteriorly in the left upper lobe. There are some ground-glass opacities as well. Evaluation for nodules or masses is limited due to the diffuse opacities. Upper Abdomen: No acute abnormality. Musculoskeletal: There is an age-indeterminate compression fracture of T10, new since 2013. IMPRESSION: 1. Bilateral diffuse pulmonary infiltrates involving all lobes as above. There was significant worsening between January 30, 2018 and February 08, 2018. Given the rapid onset, the infiltrates are most likely infectious or inflammatory. There may be a mild interstitial background component but the interstitial component is not well assessed due to the diffuse infiltrates. Recommend follow-up to resolution. 2. There is a T10 compression fracture new since 2013 but otherwise age indeterminate. 3. Atherosclerotic change in the nonaneurysmal aorta. Coronary artery calcifications. 4. Emphysema. Aortic Atherosclerosis (ICD10-I70.0) and Emphysema (ICD10-J43.9). Electronically Signed   By: Gerome Sam III M.D   On: 02/10/2018 21:13   Dg Chest Port 1 View  Result Date: 02/10/2018 CLINICAL DATA:  Hypoxemia EXAM: PORTABLE CHEST 1 VIEW COMPARISON:  02/09/2018 FINDINGS: Diffuse airspace disease and interstitial prominence throughout the lungs, stable. Mild cardiomegaly. No visible effusions or acute bony abnormality. IMPRESSION: Diffuse interstitial and alveolar opacities throughout the lungs could reflect edema or infection, unchanged. Electronically Signed   By: Charlett Nose M.D.   On: 02/10/2018 08:27   Dg Chest Port 1 View  Result Date: 02/09/2018 CLINICAL DATA:   Shortness of breath.  Hypoxemia. EXAM: PORTABLE CHEST 1 VIEW COMPARISON:  February 08, 2018 FINDINGS: Diffuse interstitial opacities throughout the right lung and the left mid and lower lung. No other interval changes. IMPRESSION: Diffuse pulmonary opacities, right greater than left.  No change. Electronically Signed   By: Gerome Sam III M.D   On: 02/09/2018 15:48    Cardiac Studies   02/10/18 ECHO (repeat from 4/20 given change in clinical status): - Left ventricle: The cavity size was normal. There was mild focal   basal hypertrophy of the septum. Systolic function was normal.   The estimated ejection fraction was in the range of 55% to 60%.   There  is hypokinesis of the basalinferior myocardium. - Aortic valve: There was mild regurgitation. - Aortic root: The aortic root was mildly dilated. - Mitral valve: Calcified annulus. There was mild regurgitation. - Left atrium: The atrium was moderately dilated. - Right atrium: The atrium was moderately dilated. - Pulmonary arteries: PA peak pressure: 31 mm Hg (S).  Impressions:  - Hypokinesis of the inferobasal wall with overall preserved LV   function; mild AI; mildly dilated aortic root; mild MR; moderate   LAE; mild RAE; mild TR.  Patient Profile     83 y.o. male with recent non-ST elevation myocardial infarction with cardiac catheterization on 01/31/2018 showing occluded proximal RCA with left-to-right collaterals, no PCI given occlusion for 4 days, here with hypoxia possibly from pulmonary edema, perhaps aspiration pneumonitis given that his current symptoms seem out of proportion to his normal ejection fraction  Assessment & Plan    Acute hypoxic respiratory failure - Suspicion is that he may have had an aspiration event in the setting of his syncopal episode given his normal ejection fraction, no change from prior.  After he left the hospital, he had 4 days of feeling well.  He has had no chronic lung disease issues.  His EF did not  drop significantly.  Nonetheless, we have been treating him for pulmonary edema etiology with IV diuretics.  He is now out approximately 4 L.  His weight is down from 189-175.  However, clinically his symptoms have not resolved.  His BUN continues to rise.  We will go ahead and hold his IV diuretics at this point.  Artley taking 40 mg IV 3 times a day. - CT scan was performed on 02/10/2018 which showed bilateral diffuse pulmonary infiltrates involving all lobes with significant worsening most likely infectious or inflammatory.  Underlying emphysema also noted.  Pulmonary following, getting nebulizers as well as IV steroids.  This may be ARDS-like physiology. -Discussed with Dr. Vassie Loll of pulmonary.  There may have been some underlying lung disease on previous chest x-rays.  He states that he did not have any significant shortness of breath prior to this episode however.  Non-ST elevation myocardial infarction -Occluded right coronary artery by recent heart catheterization.  Good collateralization.  EF remains normal.  Event occurred likely on 01/26/2018.  Troponin has been trending downward since then.  It was 13 at peak on 01/30/2018. - Daughter states that potentially his symptom of angina was shortness of breath and dizziness.  He did not have any significant chest pain.  This does make me wonder if maybe his shortness of breath might of been a manifestation of underlying lung condition.  Difficult to answer.  We discussed the rationale behind not opening up the right coronary artery given the 109-day-old chronicity of the event during the catheterization.  Permanent atrial fibrillation -On Eliquis.  Given his chronic anti-coagulation, he would not have PE.     For questions or updates, please contact [facility] Please consult [email or web address] for contact info under Cardiology/STEMI.      Signed, Donato Schultz, MD  02/11/2018, 10:21 AM

## 2018-02-11 NOTE — Progress Notes (Signed)
[facility] PCCM  S: feels slightly better, frustrated with the nasal cannula  O:  Vitals:   02/11/18 0827 02/11/18 1313 02/11/18 1356 02/11/18 1358  BP:  117/79    Pulse:  96  90  Resp:    20  Temp:  97.7 F (36.5 C)    TempSrc:  Oral    SpO2: 93% 93% 90% 91%  Weight:      Height:       80% FiO2  General:  Resting comfortably in bed HENT: NCAT OP clear PULM: Coarse crackles bases B, normal effort CV: RRR, no mgr GI: BS+, soft, nontender MSK: normal bulk and tone Neuro: awake, alert, no distress, MAEW  CT chest images reviewed: mostly dependent ground glass, bronchovascular distribution of consolidation, interlobular septal thickening and trace pleural effusions.  Likely upper lobe centrilobular emphysema vs cystic change.  Impression: Acute respiratory failure with hypoxemia Acute pulmonary edema Coronary artery disease Mild centrilobular emphysema Possible acute pneumonitis  Discussion: I think the most likely explanation for the diffuse ground glass and consolidative changes is pulmonary edema given the rapid time course and dependent nature noted on CT.  The DDx would include an inflammatory pneumonitis but I do not see changes of UIP (ie IPF) on the CT.    Plan: Continue lasix again today Continue high dose steroids again today Continue O2 supplementation to maintain O2 saturation > 90% Will re-assess daily  Heber [place], MD [place] [facility] Pager: [phone] Cell: [phone] After 3pm or if no response, call [phone]

## 2018-02-11 NOTE — Progress Notes (Addendum)
PROGRESS NOTE    Craig [name]  ZOX:[id number] DOB: 08-05-1935 DOA: 02/08/2018 PCP: Ignatius Specking, MD    Brief Narrative by " Dr Antionette Char":  Craig [name] is a 83 y.o. male with medical history significant for permanent atrial fibrillation on Eliquis, coronary artery disease with recent NSTEMI medically managed, and chronic kidney disease stage III, now presenting with shortness of breath and a syncopal episode overnight.  Patient had experienced chest pain approximately 2 weeks ago, eventually went into the ED 3 days later after pain had resolved, was found to have had NSTEMI with troponin reaching 11, underwent catheterization on 01/31/2018, noted to have RCA occlusion, and medical management was recommended.  He was discharged home on 02/04/2018, saw his cardiologist in clinic yesterday, seemed to be doing fairly well at that time, but suffered a syncopal episode early this morning when he got up to use the bathroom, and has been experiencing shortness of breath since that time.  He denies any chest pain.  Denies fevers, chills, or cough.  Reports new onset of bilateral ankle edema.  [place] ED Course: Upon arrival to the ED, patient is found to be afebrile, saturating in the 80s on room air, and vitals otherwise stable.  EKG features atrial fibrillation with T wave inversions.  Chemistry panel is notable for glucose of 224 and CBC features macrocytosis without anemia.  BNP was elevated to just over thousand and and troponin was elevated to 0.68.  A second troponin was obtained and elevated further to 0.81.  The patient was treated with 40 mg IV Lasix in the ED and supplemental oxygen.  Transfer to [facility] was arranged for admission to the stepdown unit for ongoing evaluation and management of acute pulmonary edema with hypoxic respiratory failure.   Admitted with acute hypoxic respiratory failure, recent NSTEMI, respiratory failure thought to be related to Heart failure exacerbation.  Question of lung diseases.     Assessment & Plan:   Principal Problem:   Acute pulmonary edema (HCC) Active Problems:   Coronary atherosclerosis of native coronary artery   Permanent atrial fibrillation (HCC)   Non-STEMI (non-ST elevated myocardial infarction) (HCC)   Acute respiratory failure with hypoxia (HCC)   Hyperglycemia   CKD (chronic kidney disease), stage III (HCC)  1-Acute Hypoxic Respiratory Failure; related to diastolic HF CHF exacerbation Vs Pneumonitis. Chest  X-ray with fibrosis ?Marland Kitchen  Treated with  lasix 40 mg IV TID.  Continue to be on BIPAP.  Chest x ray with persistent infiltrates.  X ray on admission showed possible pneumonitis.  No leukocytosis, pro-calcitonin less than 0.5.  CCM following.  Started on IV steroids high dose to cover for IPF. Weight 192--182--175. Negative 5 L.  Discussed with Dr Anne Fu, recommendation from Dr Kendrick Fries to continue with lasix. Initially Dr Anne Fu recommended to stop lasix after he discussed with Dr Vassie Loll, patient case. We got to the conclusion to give 40 mg IV lasix tonight  and repeat renal function in am. BUN is elevated but cr has remain relative stable in the 1.3 range. Also  elevation on BUN could be related to steroids.  I discussed changes of plans with patient and family who were at bedside. They were appreciative of update.  Will need to discussed tomorrow (02-12-2018) with cardiology and pulmonologist further lasix doses.   2-N-STEMI;   Known occlusion of RCA on cath 4/19 with medical mgmt recommended  Cardiology following.  Limited ECHO EF 50 %  3-A fib; on metoprolol and  eliquis.   4-CKD stage III  Monitor renal function on lasix.   5-Hypokalemia; replete orally.   Syncope; monitor on telemetry.      DVT prophylaxis: eliquis Code Status:  Full code.  Family Communication: care discussed with niece  Disposition Plan: remain inpatient.   Consultants:   Cardiology  CCM   Procedures:  ECHO   Antimicrobials: none   Subjective: He is hungry, wants to eat. Denies worsening dyspnea.  Denies chest pain .   Objective: Vitals:   02/11/18 0433 02/11/18 0802 02/11/18 0826 02/11/18 0827  BP: 116/82 124/70    Pulse: 88 96    Resp: 19     Temp: 98 F (36.7 C) 97.6 F (36.4 C)    TempSrc:  Oral    SpO2: 93% 90% 91% 93%  Weight: 79.4 kg (175 lb 0.7 oz)     Height:        Intake/Output Summary (Last 24 hours) at 02/11/2018 1025 Last data filed at 02/11/2018 0544 Gross per 24 hour  Intake 1193 ml  Output 800 ml  Net 393 ml   Filed Weights   02/09/18 0518 02/10/18 0457 02/11/18 0433  Weight: 86 kg (189 lb 9.5 oz) 82.9 kg (182 lb 12.2 oz) 79.4 kg (175 lb 0.7 oz)    Examination:  General exam: on high flow oxygen.  Respiratory system: Bilateral crackles.  Cardiovascular system: S 1, S 2 RRR Gastrointestinal system:  BS present, soft.  Central nervous system: Alert  Extremities: Symmetric power Skin: No rash    Data Reviewed: I have personally reviewed following labs and imaging studies  CBC: Recent Labs  Lab 02/09/18 0449 02/10/18 0453  WBC 11.3* 10.0  NEUTROABS 9.7*  --   HGB 13.2 13.4  HCT 39.1 40.3  MCV 101.8* 102.5*  PLT 291 315   Basic Metabolic Panel: Recent Labs  Lab 02/09/18 0449 02/09/18 1702 02/10/18 0453 02/11/18 0504  NA 139 141 142 142  K 3.1* 3.4* 3.5 3.9  CL 103 101 103 104  CO2 GLUCOSE 110* 141* 126* 141*  BUN 19 21* 32* 46*  CREATININE 1.25* 1.37* 1.38* 1.32*  CALCIUM 9.2 9.0 9.7 10.0  MG  --  1.8 2.4  --    GFR: Estimated Creatinine Clearance: 50.1 mL/min (A) (by C-G formula based on SCr of 1.32 mg/dL (H)). Liver Function Tests: Recent Labs  Lab 02/09/18 0449  AST 27  ALT 22  ALKPHOS 83  BILITOT 1.2  PROT 6.1*  ALBUMIN 2.5*   No results for input(s): LIPASE, AMYLASE in the last 168 hours. No results for input(s): AMMONIA in the last 168 hours. Coagulation Profile: No results for input(s): INR,  PROTIME in the last 168 hours. Cardiac Enzymes: Recent Labs  Lab 02/08/18 2224 02/09/18 0449 02/09/18 1015  TROPONINI 1.31* 1.19* 1.09*   BNP (last 3 results) No results for input(s): PROBNP in the last 8760 hours. HbA1C: Recent Labs    02/09/18 0449  HGBA1C 5.4   CBG: Recent Labs  Lab 02/10/18 1612 02/10/18 2114 02/11/18 0016 02/11/18 0429 02/11/18 0736  GLUCAP 130* 195* 201* 134* 130*   Lipid Profile: No results for input(s): CHOL, HDL, LDLCALC, TRIG, CHOLHDL, LDLDIRECT in the last 72 hours. Thyroid Function Tests: No results for input(s): TSH, T4TOTAL, FREET4, T3FREE, THYROIDAB in the last 72 hours. Anemia Panel: No results for input(s): VITAMINB12, FOLATE, FERRITIN, TIBC, IRON, RETICCTPCT in the last 72 hours. Sepsis Labs: Recent Labs  Lab 02/08/18 2224 02/09/18  0449 02/10/18 0453  PROCALCITON 0.29 0.32 0.32  LATICACIDVEN 1.7  --   --     Recent Results (from the past 240 hour(s))  MRSA PCR Screening     Status: None   Collection Time: 02/09/18  5:30 AM  Result Value Ref Range Status   MRSA by PCR NEGATIVE NEGATIVE Final    Comment:        The GeneXpert MRSA Assay (FDA approved for NASAL specimens only), is one component of a comprehensive MRSA colonization surveillance program. It is not intended to diagnose MRSA infection nor to guide or monitor treatment for MRSA infections. Performed at [facility], [address], [place], [place] [address]          Radiology Studies: Ct Chest Wo Contrast  Result Date: 02/10/2018 CLINICAL DATA:  Interstitial lung disease. EXAM: CT CHEST WITHOUT CONTRAST TECHNIQUE: Multidetector CT imaging of the chest was performed following the standard protocol without IV contrast. COMPARISON:  Multiple chest x-rays since July 15, 2012 FINDINGS: Cardiovascular: Atherosclerotic change in the nonaneurysmal aorta. The central pulmonary arteries are normal in caliber. Coronary artery calcifications are noted. Mild  cardiomegaly. Mediastinum/Nodes: Small right pleural effusion. No pericardial effusion. The esophagus and thyroid is normal. No adenopathy in the chest. Lungs/Pleura: Central airways are normal. No pneumothorax. There are some emphysematous changes in the lungs. Focal pulmonary infiltrates are seen bilaterally. The right infiltrates are most prominent in the right perihilar region, involving the upper and lower lobe. The infiltrate on the left is most prominent in the left lower lobe and posteriorly in the left upper lobe. There are some ground-glass opacities as well. Evaluation for nodules or masses is limited due to the diffuse opacities. Upper Abdomen: No acute abnormality. Musculoskeletal: There is an age-indeterminate compression fracture of T10, new since 2013. IMPRESSION: 1. Bilateral diffuse pulmonary infiltrates involving all lobes as above. There was significant worsening between January 30, 2018 and February 08, 2018. Given the rapid onset, the infiltrates are most likely infectious or inflammatory. There may be a mild interstitial background component but the interstitial component is not well assessed due to the diffuse infiltrates. Recommend follow-up to resolution. 2. There is a T10 compression fracture new since 2013 but otherwise age indeterminate. 3. Atherosclerotic change in the nonaneurysmal aorta. Coronary artery calcifications. 4. Emphysema. Aortic Atherosclerosis (ICD10-I70.0) and Emphysema (ICD10-J43.9). Electronically Signed   By: Gerome Sam III M.D   On: 02/10/2018 21:13   Dg Chest Port 1 View  Result Date: 02/10/2018 CLINICAL DATA:  Hypoxemia EXAM: PORTABLE CHEST 1 VIEW COMPARISON:  02/09/2018 FINDINGS: Diffuse airspace disease and interstitial prominence throughout the lungs, stable. Mild cardiomegaly. No visible effusions or acute bony abnormality. IMPRESSION: Diffuse interstitial and alveolar opacities throughout the lungs could reflect edema or infection, unchanged. Electronically  Signed   By: Charlett Nose M.D.   On: 02/10/2018 08:27   Dg Chest Port 1 View  Result Date: 02/09/2018 CLINICAL DATA:  Shortness of breath.  Hypoxemia. EXAM: PORTABLE CHEST 1 VIEW COMPARISON:  February 08, 2018 FINDINGS: Diffuse interstitial opacities throughout the right lung and the left mid and lower lung. No other interval changes. IMPRESSION: Diffuse pulmonary opacities, right greater than left.  No change. Electronically Signed   By: Gerome Sam III M.D   On: 02/09/2018 15:48        Scheduled Meds: . allopurinol  300 mg Oral QPM  . apixaban  5 mg Oral BID  . aspirin EC  81 mg Oral Daily  .  atorvastatin  20 mg Oral q1800  . doxazosin  8 mg Oral Daily  . furosemide  40 mg Intravenous TID  . gemfibrozil  600 mg Oral BID  . insulin aspart  0-9 Units Subcutaneous Q4H  . ipratropium-albuterol  3 mL Nebulization Q6H  . methylPREDNISolone (SOLU-MEDROL) injection  60 mg Intravenous Q6H  . metoprolol succinate  12.5 mg Oral Daily  . potassium chloride  40 mEq Oral BID  . sodium chloride flush  3 mL Intravenous Q12H   Continuous Infusions: . sodium chloride    . magnesium sulfate 1 - 4 g bolus IVPB       LOS: 3 days    Time spent: 35 minutes.     Craig Cory, MD [facility] Pager [phone]  If 7PM-7AM, please contact night-coverage [email or web address] Password [facility] 02/11/2018, 10:25 AM

## 2018-02-12 ENCOUNTER — Telehealth: Payer: Self-pay

## 2018-02-12 LAB — GLUCOSE, CAPILLARY
GLUCOSE-CAPILLARY: 139 mg/dL — AB (ref 65–99)
GLUCOSE-CAPILLARY: 151 mg/dL — AB (ref 65–99)
GLUCOSE-CAPILLARY: 164 mg/dL — AB (ref 65–99)
GLUCOSE-CAPILLARY: 194 mg/dL — AB (ref 65–99)
Glucose-Capillary: 139 mg/dL — ABNORMAL HIGH (ref 65–99)
Glucose-Capillary: 150 mg/dL — ABNORMAL HIGH (ref 65–99)
Glucose-Capillary: 170 mg/dL — ABNORMAL HIGH (ref 65–99)

## 2018-02-12 LAB — BASIC METABOLIC PANEL
ANION GAP: 13 (ref 5–15)
BUN: 53 mg/dL — ABNORMAL HIGH (ref 6–20)
CHLORIDE: 98 mmol/L — AB (ref 101–111)
CO2: 31 mmol/L (ref 22–32)
Calcium: 10.1 mg/dL (ref 8.9–10.3)
Creatinine, Ser: 1.39 mg/dL — ABNORMAL HIGH (ref 0.61–1.24)
GFR calc non Af Amer: 46 mL/min — ABNORMAL LOW (ref >60)
GFR, EST AFRICAN AMERICAN: 54 mL/min — AB (ref >60)
Glucose, Bld: 138 mg/dL — ABNORMAL HIGH (ref 65–99)
Potassium: 3.8 mmol/L (ref 3.5–5.1)
Sodium: 142 mmol/L (ref 135–145)

## 2018-02-12 MED ORDER — FUROSEMIDE 10 MG/ML IJ SOLN
40.0000 mg | Freq: Two times a day (BID) | INTRAMUSCULAR | Status: DC
  Administered 2018-02-12 – 2018-02-14 (×4): 40 mg via INTRAVENOUS
  Filled 2018-02-12 (×4): qty 4

## 2018-02-12 MED ORDER — METHYLPREDNISOLONE SODIUM SUCC 125 MG IJ SOLR: 125.0000 mg | Freq: Two times a day (BID) | INTRAMUSCULAR | Status: DC

## 2018-02-12 MED ORDER — MENTHOL 3 MG MT LOZG
1.0000 | LOZENGE | OROMUCOSAL | Status: DC | PRN
  Filled 2018-02-12 (×2): qty 9

## 2018-02-12 MED ORDER — METHYLPREDNISOLONE SODIUM SUCC 125 MG IJ SOLR
125.0000 mg | Freq: Two times a day (BID) | INTRAMUSCULAR | Status: DC
  Administered 2018-02-12 – 2018-02-14 (×4): 125 mg via INTRAVENOUS
  Filled 2018-02-12 (×4): qty 2

## 2018-02-12 NOTE — Progress Notes (Signed)
[facility] PCCM  Subjective: 83 year old male patient currently resting in bed no acute distress he feels he is getting better Objective  Vitals:   02/12/18 0802 02/12/18 0927 02/12/18 1121 02/12/18 1140  BP: 110/76  126/84   Pulse: 95 (Abnormal) 101 91 93  Resp: Temp:      TempSrc:      SpO2: 93% 92% 92% 92%  Weight:      Height:       Now 50% high flow with flow rate of 40 Filed Weights   02/10/18 0457 02/11/18 0433 02/12/18 0407  Weight: 182 lb 12.2 oz (82.9 kg) 175 lb 0.7 oz (79.4 kg) 175 lb 14.4 oz (79.8 kg)   General exam: 83 year old male resting in bed no acute distress currently on high flow oxygen HEENT normocephalic atraumatic no jugular venous distention mucous membranes are moist  Pulmonary: Dry crackles bases, some tactile fremitus.  Able to speak in full sentences, weaning oxygen. Cardiac: Regular irregular atrial fibrillation Abdomen: Soft nontender no organomegaly Extremities/musculoskeletal: Warm dry no significant edema Neuro: Awake oriented he is white appearing. CBC Recent Labs    02/10/18 0453  WBC 10.0  HGB 13.4  HCT 40.3  PLT 315    Coag's No results for input(s): APTT, INR in the last 72 hours.  BMET Recent Labs    02/10/18 0453 02/11/18 0504 02/12/18 0313  NA 142 142 142  K 3.5 3.9 3.8  CL 103 104 98*  CO2 BUN 32* 46* 53*  CREATININE 1.38* 1.32* 1.39*  GLUCOSE 126* 141* 138*    Electrolytes Recent Labs    02/09/18 1702 02/10/18 0453 02/11/18 0504 02/12/18 0313  CALCIUM 9.0 9.7 10.0 10.1  MG 1.8 2.4 2.4  --     Sepsis Markers Recent Labs    02/10/18 0453  PROCALCITON 0.32    ABG No results for input(s): PHART, PCO2ART, PO2ART in the last 72 hours.  Liver Enzymes No results for input(s): AST, ALT, ALKPHOS, BILITOT, ALBUMIN in the last 72 hours.  Cardiac Enzymes No results for input(s): TROPONINI, PROBNP in the last 72 hours.  Glucose Recent Labs    02/11/18 1632 02/11/18 2007 02/12/18 0053  02/12/18 0405 02/12/18 0820 02/12/18 1120  GLUCAP 232* 125* 170* 150* 151* 194*    Imaging Ct Chest Wo Contrast  Result Date: 02/10/2018 CLINICAL DATA:  Interstitial lung disease. EXAM: CT CHEST WITHOUT CONTRAST TECHNIQUE: Multidetector CT imaging of the chest was performed following the standard protocol without IV contrast. COMPARISON:  Multiple chest x-rays since July 15, 2012 FINDINGS: Cardiovascular: Atherosclerotic change in the nonaneurysmal aorta. The central pulmonary arteries are normal in caliber. Coronary artery calcifications are noted. Mild cardiomegaly. Mediastinum/Nodes: Small right pleural effusion. No pericardial effusion. The esophagus and thyroid is normal. No adenopathy in the chest. Lungs/Pleura: Central airways are normal. No pneumothorax. There are some emphysematous changes in the lungs. Focal pulmonary infiltrates are seen bilaterally. The right infiltrates are most prominent in the right perihilar region, involving the upper and lower lobe. The infiltrate on the left is most prominent in the left lower lobe and posteriorly in the left upper lobe. There are some ground-glass opacities as well. Evaluation for nodules or masses is limited due to the diffuse opacities. Upper Abdomen: No acute abnormality. Musculoskeletal: There is an age-indeterminate compression fracture of T10, new since 2013. IMPRESSION: 1. Bilateral diffuse pulmonary infiltrates involving all lobes as above. There was significant worsening between January 30, 2018 and February 08, 2018. Given the rapid onset, the infiltrates are most likely infectious or inflammatory. There may be a mild interstitial background component but the interstitial component is not well assessed due to the diffuse infiltrates. Recommend follow-up to resolution. 2. There is a T10 compression fracture new since 2013 but otherwise age indeterminate. 3. Atherosclerotic change in the nonaneurysmal aorta. Coronary artery calcifications. 4.  Emphysema. Aortic Atherosclerosis (ICD10-I70.0) and Emphysema (ICD10-J43.9). Electronically Signed   By: Gerome Sam III M.D   On: 02/10/2018 21:13    CT chest images reviewed: mostly dependent ground glass, bronchovascular distribution of consolidation, interlobular septal thickening and trace pleural effusions.  Likely upper lobe centrilobular emphysema vs cystic change.  Impression: Acute respiratory failure with hypoxemia Acute pulmonary edema Coronary artery disease Per afib Mild centrilobular emphysema Possible acute pneumonitis  Discussion: Re: acute hypoxic resp failure w/ diffuse pulmonary infiltrates.  ddx including pulmonary edema vs some sort of pneumonitis but radiographic changes are not c/w UIP.  -Day 3 high dose steroids in addition to on-going aggressive diuresis .  -down to 65% heated high flow w/sats 93% -(-)6 liters & weight trending down He seems to be getting better with current therapy I am still not sure about the etiology.  Plan: Continue 1 more day of higher dosing steroids Then begin slow taper Continue to push diuresis as long as BUN and creatinine tolerate Follow-up portable chest x-ray in a.m. Continue to wean oxygen as tolerated Mobilize  .Simonne Martinet ACNP-BC [facility] Pulmonary/Critical Care Pager # [phone] OR # [phone] if no answer

## 2018-02-12 NOTE — Plan of Care (Signed)
  Problem: Nutrition: Goal: Adequate nutrition will be maintained Outcome: Progressing   

## 2018-02-12 NOTE — Progress Notes (Signed)
Progress Note  Patient Name: Craig [name] Date of Encounter: 02/12/2018  Primary Cardiologist: Nona Dell, MD   Subjective   May be feels a little bit better than yesterday.  Still on high flow oxygen.  No chest pain.  Inpatient Medications    Scheduled Meds: . allopurinol  300 mg Oral QPM  . apixaban  5 mg Oral BID  . aspirin EC  81 mg Oral Daily  . atorvastatin  20 mg Oral q1800  . doxazosin  8 mg Oral Daily  . gemfibrozil  600 mg Oral BID  . insulin aspart  0-9 Units Subcutaneous Q4H  . ipratropium-albuterol  3 mL Nebulization Q6H  . methylPREDNISolone (SOLU-MEDROL) injection  125 mg Intravenous Q6H  . metoprolol succinate  12.5 mg Oral Daily  . potassium chloride  40 mEq Oral BID  . sodium chloride flush  3 mL Intravenous Q12H   Continuous Infusions: . sodium chloride    . magnesium sulfate 1 - 4 g bolus IVPB     PRN Meds: sodium chloride, acetaminophen, ALPRAZolam, magnesium hydroxide, ondansetron (ZOFRAN) IV, sodium chloride flush   Vital Signs    Vitals:   02/12/18 0407 02/12/18 0753 02/12/18 0802 02/12/18 0927  BP: 115/67  110/76   Pulse: 92 97 95 (!) 101  Resp: Temp: (!) 97.2 F (36.2 C)     TempSrc: Oral     SpO2: 90% 93% 93% 92%  Weight: 175 lb 14.4 oz (79.8 kg)     Height:        Intake/Output Summary (Last 24 hours) at 02/12/2018 1117 Last data filed at 02/12/2018 0951 Gross per 24 hour  Intake 1080 ml  Output 1800 ml  Net -720 ml   Filed Weights   02/10/18 0457 02/11/18 0433 02/12/18 0407  Weight: 182 lb 12.2 oz (82.9 kg) 175 lb 0.7 oz (79.4 kg) 175 lb 14.4 oz (79.8 kg)    Telemetry    Atrial fibrillation, rate controlled- Personally Reviewed  ECG    Atrial fibrillation- Personally Reviewed  Physical Exam   GEN: No acute distress.   Neck: No JVD Cardiac: RRR, no murmurs, rubs, or gallops.  Respiratory: Clear to auscultation bilaterally. GI: Soft, nontender, non-distended  MS: No edema; No deformity. Neuro:   Nonfocal  Psych: Normal affect   Labs    Chemistry Recent Labs  Lab 02/09/18 0449  02/10/18 0453 02/11/18 0504 02/12/18 0313  NA 139   < > 142 142 142  K 3.1*   < > 3.5 3.9 3.8  CL 103   < > 103 104 98*  CO2 27   < > GLUCOSE 110*   < > 126* 141* 138*  BUN 19   < > 32* 46* 53*  CREATININE 1.25*   < > 1.38* 1.32* 1.39*  CALCIUM 9.2   < > 9.7 10.0 10.1  PROT 6.1*  --   --   --   --   ALBUMIN 2.5*  --   --   --   --   AST 27  --   --   --   --   ALT 22  --   --   --   --   ALKPHOS 83  --   --   --   --   BILITOT 1.2  --   --   --   --   GFRNONAA 53*   < > 47* 49* 46*  GFRAA >60   < > 54* 57* 54*  ANIONGAP 9   < > < > = values in this interval not displayed.     Hematology Recent Labs  Lab 02/09/18 0449 02/10/18 0453  WBC 11.3* 10.0  RBC 3.84* 3.93*  HGB 13.2 13.4  HCT 39.1 40.3  MCV 101.8* 102.5*  MCH 34.4* 34.1*  MCHC 33.8 33.3  RDW 14.1 14.0  PLT 291 315    Cardiac Enzymes Recent Labs  Lab 02/08/18 2224 02/09/18 0449 02/09/18 1015  TROPONINI 1.31* 1.19* 1.09*   No results for input(s): TROPIPOC in the last 168 hours.   BNP Recent Labs  Lab 02/10/18 0453  BNP 647.3*     DDimer No results for input(s): DDIMER in the last 168 hours.   Radiology    Ct Chest Wo Contrast  Result Date: 02/10/2018 CLINICAL DATA:  Interstitial lung disease. EXAM: CT CHEST WITHOUT CONTRAST TECHNIQUE: Multidetector CT imaging of the chest was performed following the standard protocol without IV contrast. COMPARISON:  Multiple chest x-rays since July 15, 2012 FINDINGS: Cardiovascular: Atherosclerotic change in the nonaneurysmal aorta. The central pulmonary arteries are normal in caliber. Coronary artery calcifications are noted. Mild cardiomegaly. Mediastinum/Nodes: Small right pleural effusion. No pericardial effusion. The esophagus and thyroid is normal. No adenopathy in the chest. Lungs/Pleura: Central airways are normal. No pneumothorax. There are  some emphysematous changes in the lungs. Focal pulmonary infiltrates are seen bilaterally. The right infiltrates are most prominent in the right perihilar region, involving the upper and lower lobe. The infiltrate on the left is most prominent in the left lower lobe and posteriorly in the left upper lobe. There are some ground-glass opacities as well. Evaluation for nodules or masses is limited due to the diffuse opacities. Upper Abdomen: No acute abnormality. Musculoskeletal: There is an age-indeterminate compression fracture of T10, new since 2013. IMPRESSION: 1. Bilateral diffuse pulmonary infiltrates involving all lobes as above. There was significant worsening between January 30, 2018 and February 08, 2018. Given the rapid onset, the infiltrates are most likely infectious or inflammatory. There may be a mild interstitial background component but the interstitial component is not well assessed due to the diffuse infiltrates. Recommend follow-up to resolution. 2. There is a T10 compression fracture new since 2013 but otherwise age indeterminate. 3. Atherosclerotic change in the nonaneurysmal aorta. Coronary artery calcifications. 4. Emphysema. Aortic Atherosclerosis (ICD10-I70.0) and Emphysema (ICD10-J43.9). Electronically Signed   By: Gerome Sam III M.D   On: 02/10/2018 21:13    Cardiac Studies    02/10/18 ECHO (repeat from 4/20 given change in clinical status): - Left ventricle: The cavity size was normal. There was mild focal basal hypertrophy of the septum. Systolic function was normal. The estimated ejection fraction was in the range of 55% to 60%. There is hypokinesis of the basalinferior myocardium. - Aortic valve: There was mild regurgitation. - Aortic root: The aortic root was mildly dilated. - Mitral valve: Calcified annulus. There was mild regurgitation. - Left atrium: The atrium was moderately dilated. - Right atrium: The atrium was moderately dilated. - Pulmonary arteries: PA  peak pressure: 31 mm Hg (S).  Impressions:  - Hypokinesis of the inferobasal wall with overall preserved LV function; mild AI; mildly dilated aortic root; mild MR; moderate LAE; mild RAE; mild TR.     Patient Profile     83 y.o. male  with recent non-ST elevation myocardial infarction with cardiac catheterization on 01/31/2018 showing occluded proximal RCA with  left-to-right collaterals, no PCI given occlusion for 4 days, here with hypoxia possibly from pulmonary edema, perhaps aspiration pneumonitis given that his current symptoms seem out of proportion to his normal ejection fraction   Assessment & Plan    Acute hypoxic respiratory failure - Could be pulmonary edema, could be an acute inflammatory pulmonary process.  Ejection fraction remains the same.  There is no mechanical failure of the heart i.e. VSD or severe mitral regurgitation post MI. -Continue with empiric treatment with both Lasix (I changed back to BID) as well as IV steroids. -We need to perform right heart catheterization to figure out whether or not this is cardiogenic or pulmonary.  He is on Eliquis.  Currently he is may be mildly improved clinically from yesterday, we will hold off on holding the Eliquis at this point. -BUN and creatinine closely monitored  Recent non-ST elevation myocardial infarction with occluded proximal RCA with good left to right collaterals, coronary artery disease -Troponin peak was 13.  EF remains normal with basal inferior hypokinesis.  Permanent atrial fibrillation -On Eliquis.   For questions or updates, please contact [facility] Please consult [email or web address] for contact info under Cardiology/STEMI.      Signed, Donato Schultz, MD  02/12/2018, 11:17 AM

## 2018-02-12 NOTE — Progress Notes (Signed)
PROGRESS NOTE  Craig [name] ZOX:[id number] DOB: 30-Oct-1934 DOA: 02/08/2018 PCP: Ignatius Specking, MD  PCP: Ignatius Specking, MD          Brief Narrative by " Dr Antionette Char": Craig [name] a 83 y.o.malewith medical history significant forpermanent atrial fibrillation on Eliquis, coronary artery disease with recentNSTEMImedically managed,and chronic kidney disease stage III, now presenting with shortness of breath and a syncopal episode overnight. Patient had experienced chest pain approximately 2 weeks ago, eventually went into the ED 3 days later after pain had resolved, was found to have had NSTEMIwith troponin reaching 11, underwent catheterization on 01/31/2018, noted to have RCA occlusion, and medical management was recommended. He was discharged home on 02/04/2018, saw his cardiologist in clinic yesterday, seemed to be doing fairly well at that time, but suffered a syncopal episode early this morning when he got up to use the bathroom, and has been experiencing shortness of breath since that time. He denies any chest pain. Denies fevers, chills, or cough. Reports new onset of bilateral ankle edema.  [place]:Upon arrival to the ED, patient is found to beafebrile, saturating in the 80s on room air, and vitals otherwise stable. EKG features atrial fibrillation with T wave inversions. Chemistry panel is notable for glucose of 224 and CBC features macrocytosis without anemia. BNP was elevated to just over thousand and and troponin was elevated to 0.68. A second troponin was obtained and elevated further to 0.81. The patient was treated with 40 mg IV Lasix in the ED and supplemental oxygen. Transfer to [facility] was arranged for admission to the stepdown unit for ongoing evaluation and management of acute pulmonary edema with hypoxic respiratory failure.   Admitted with acute hypoxic respiratory failure, recent NSTEMI, respiratory failure thought to be related to  Heart failure exacerbation. Question of lung diseases.     HPI/Recap of past 24 hours:  Reports feeling better, denies chest pain, some mild intermittent dry cough, no fever He is sitting up in bed eating lunch ,able to talk in full sentences  on HFNC  Assessment/Plan: Principal Problem:   Acute pulmonary edema (HCC) Active Problems:   Coronary atherosclerosis of native coronary artery   Permanent atrial fibrillation (HCC)   Non-STEMI (non-ST elevated myocardial infarction) (HCC)   Acute respiratory failure with hypoxia (HCC)   Hyperglycemia   CKD (chronic kidney disease), stage III (HCC)  Acute hypoxic respiratory failure -Unclear etiology, pulmonary edema versus pneumonitis -He is improving on IV IV Lasix , high-dose steroid, nebs , on HFNC -Cardiology and pulmonology following  Hypokalemia/hypomagnesemia: replace k/mag  N-STEMI;  -he was hospitalized  From 4/18 to 4/23 to cardiology service for NSTEMI - has cardiac cath on 4/19 with Known occlusion of RCA on cath 4/19 with medical mgmt recommended -denies chest pain, Limited ECHO EF 50 % -Cardiology following.    A fib;  Remain in afib on metoprolol and eliquis.   CKD stage III  Monitor renal function on lasix.   Bph: continue cardura  Gout: stable on allopurinol   Code Status: full  Family Communication: patient and family at bedside  Disposition Plan: not ready to discharge   Consultants:  cardiology  Pulmonology/critical care  Procedures:  none  Antibiotics:  none   Objective: BP 126/84 (BP Location: Right Arm)   Pulse 93   Temp (!) 97.2 F (36.2 C) (Oral)   Resp 20   Ht  (1.854 m)   Wt 79.8 kg (175 lb 14.4 oz)  SpO2 92%   BMI 23.21 kg/m   Intake/Output Summary (Last 24 hours) at 02/12/2018 1544 Last data filed at 02/12/2018 0951 Gross per 24 hour  Intake 720 ml  Output 1800 ml  Net -1080 ml   Filed Weights   02/10/18 0457 02/11/18 0433 02/12/18 0407  Weight: 82.9  kg (182 lb 12.2 oz) 79.4 kg (175 lb 0.7 oz) 79.8 kg (175 lb 14.4 oz)    Exam: Patient is examined daily including today on 02/12/2018, exams remain the same as of yesterday except that has changed    General:  NAD  Cardiovascular: IRRR  Respiratory: +crackles, no wheezing  Abdomen: Soft/ND/NT, positive BS  Musculoskeletal: No Edema  Neuro: alert, oriented   Data Reviewed: Basic Metabolic Panel: Recent Labs  Lab 02/09/18 0449 02/09/18 1702 02/10/18 0453 02/11/18 0504 02/12/18 0313  NA 139 141 142 142 142  K 3.1* 3.4* 3.5 3.9 3.8  CL 103 101 103 104 98*  CO2 GLUCOSE 110* 141* 126* 141* 138*  BUN 19 21* 32* 46* 53*  CREATININE 1.25* 1.37* 1.38* 1.32* 1.39*  CALCIUM 9.2 9.0 9.7 10.0 10.1  MG  --  1.8 2.4 2.4  --    Liver Function Tests: Recent Labs  Lab 02/09/18 0449  AST 27  ALT 22  ALKPHOS 83  BILITOT 1.2  PROT 6.1*  ALBUMIN 2.5*   No results for input(s): LIPASE, AMYLASE in the last 168 hours. No results for input(s): AMMONIA in the last 168 hours. CBC: Recent Labs  Lab 02/09/18 0449 02/10/18 0453  WBC 11.3* 10.0  NEUTROABS 9.7*  --   HGB 13.2 13.4  HCT 39.1 40.3  MCV 101.8* 102.5*  PLT 291 315   Cardiac Enzymes:   Recent Labs  Lab 02/08/18 2224 02/09/18 0449 02/09/18 1015  TROPONINI 1.31* 1.19* 1.09*   BNP (last 3 results) Recent Labs    02/10/18 0453  BNP 647.3*    ProBNP (last 3 results) No results for input(s): PROBNP in the last 8760 hours.  CBG: Recent Labs  Lab 02/11/18 2007 02/12/18 0053 02/12/18 0405 02/12/18 0820 02/12/18 1120  GLUCAP 125* 170* 150* 151* 194*    Recent Results (from the past 240 hour(s))  MRSA PCR Screening     Status: None   Collection Time: 02/09/18  5:30 AM  Result Value Ref Range Status   MRSA by PCR NEGATIVE NEGATIVE Final    Comment:        The GeneXpert MRSA Assay (FDA approved for NASAL specimens only), is one component of a comprehensive MRSA colonization surveillance  program. It is not intended to diagnose MRSA infection nor to guide or monitor treatment for MRSA infections. Performed at [facility], [address], [place], [place] [address]      Studies: No results found.  Scheduled Meds: . allopurinol  300 mg Oral QPM  . apixaban  5 mg Oral BID  . aspirin EC  81 mg Oral Daily  . atorvastatin  20 mg Oral q1800  . doxazosin  8 mg Oral Daily  . furosemide  40 mg Intravenous BID  . gemfibrozil  600 mg Oral BID  . insulin aspart  0-9 Units Subcutaneous Q4H  . ipratropium-albuterol  3 mL Nebulization Q6H  . methylPREDNISolone (SOLU-MEDROL) injection  125 mg Intravenous Q6H  . metoprolol succinate  12.5 mg Oral Daily  . potassium chloride  40 mEq Oral BID  . sodium chloride flush  3 mL Intravenous  Q12H    Continuous Infusions: . sodium chloride    . magnesium sulfate 1 - 4 g bolus IVPB       Time spent: I have personally reviewed and interpreted on  02/12/2018 daily labs, tele strips, imagings as discussed above under date review session and assessment and plans.  I reviewed all nursing notes, pharmacy notes, consultant notes,  vitals, pertinent old records  I have discussed plan of care as described above with RN , patient and family on 02/12/2018   Albertine Grates MD, PhD  [facility] Pager [phone]. If 7PM-7AM, please contact night-coverage at [email or web address], password [facility] 02/12/2018, 3:44 PM  LOS: 4 days

## 2018-02-12 NOTE — Telephone Encounter (Signed)
Niece Amy, on DPR contacted office stating patient had passed out 03-12-2023 morning and they called EMS to take him to [facility]. Niece states patient is really not doing well and requested Dr. Diona Browner to review the strips from heart monitor. Strips faxed to [facility] office for Dr. Diona Browner to review.

## 2018-02-13 ENCOUNTER — Inpatient Hospital Stay (HOSPITAL_COMMUNITY): Payer: Medicare Other

## 2018-02-13 DIAGNOSIS — J849 Interstitial pulmonary disease, unspecified: Secondary | ICD-10-CM

## 2018-02-13 LAB — GLUCOSE, CAPILLARY
GLUCOSE-CAPILLARY: 169 mg/dL — AB (ref 65–99)
GLUCOSE-CAPILLARY: 218 mg/dL — AB (ref 65–99)
GLUCOSE-CAPILLARY: 117 mg/dL — AB (ref 65–99)
Glucose-Capillary: 156 mg/dL — ABNORMAL HIGH (ref 65–99)
Glucose-Capillary: 130 mg/dL — ABNORMAL HIGH (ref 65–99)

## 2018-02-13 LAB — BASIC METABOLIC PANEL
ANION GAP: 10 (ref 5–15)
BUN: 53 mg/dL — ABNORMAL HIGH (ref 6–20)
CO2: 26 mmol/L (ref 22–32)
Calcium: 9.9 mg/dL (ref 8.9–10.3)
Chloride: 104 mmol/L (ref 101–111)
Creatinine, Ser: 1.3 mg/dL — ABNORMAL HIGH (ref 0.61–1.24)
GFR, EST AFRICAN AMERICAN: 58 mL/min — AB (ref >60)
GFR, EST NON AFRICAN AMERICAN: 50 mL/min — AB (ref >60)
GLUCOSE: 144 mg/dL — AB (ref 65–99)
Potassium: 4.6 mmol/L (ref 3.5–5.1)
Sodium: 140 mmol/L (ref 135–145)

## 2018-02-13 LAB — SEDIMENTATION RATE: SED RATE: 19 mm/h — AB (ref 0–16)

## 2018-02-13 LAB — MAGNESIUM: MAGNESIUM: 2.7 mg/dL — AB (ref 1.7–2.4)

## 2018-02-13 LAB — CK: Total CK: 27 U/L — ABNORMAL LOW (ref 49–397)

## 2018-02-13 LAB — URIC ACID: URIC ACID, SERUM: 7.7 mg/dL — AB (ref 4.4–7.6)

## 2018-02-13 LAB — TSH: TSH: 0.974 u[IU]/mL (ref 0.350–4.500)

## 2018-02-13 MED ORDER — SALINE SPRAY 0.65 % NA SOLN
1.0000 | NASAL | Status: DC | PRN
  Filled 2018-02-13: qty 44

## 2018-02-13 MED ORDER — PHENOL 1.4 % MT LIQD
1.0000 | OROMUCOSAL | Status: DC | PRN
  Administered 2018-02-14: 1 via OROMUCOSAL
  Filled 2018-02-13: qty 177

## 2018-02-13 MED ORDER — POTASSIUM CHLORIDE CRYS ER 20 MEQ PO TBCR
40.0000 meq | EXTENDED_RELEASE_TABLET | Freq: Every day | ORAL | Status: DC
  Administered 2018-02-14 – 2018-02-15 (×2): 40 meq via ORAL
  Filled 2018-02-13 (×2): qty 2

## 2018-02-13 MED ORDER — IPRATROPIUM-ALBUTEROL 0.5-2.5 (3) MG/3ML IN SOLN
3.0000 mL | Freq: Three times a day (TID) | RESPIRATORY_TRACT | Status: DC
  Administered 2018-02-13 – 2018-02-16 (×8): 3 mL via RESPIRATORY_TRACT
  Filled 2018-02-13 (×10): qty 3

## 2018-02-13 MED ORDER — ZINC OXIDE 40 % EX OINT
TOPICAL_OINTMENT | Freq: Every morning | CUTANEOUS | Status: DC
  Administered 2018-02-13 – 2018-02-26 (×14): via TOPICAL
  Filled 2018-02-13: qty 113

## 2018-02-13 MED ORDER — POTASSIUM CHLORIDE CRYS ER 20 MEQ PO TBCR: 40.0000 meq | EXTENDED_RELEASE_TABLET | Freq: Once | ORAL | Status: DC

## 2018-02-13 NOTE — Plan of Care (Signed)
  Problem: Clinical Measurements: Goal: Respiratory complications will improve Outcome: Progressing   Problem: Clinical Measurements: Goal: Ability to maintain clinical measurements within normal limits will improve Outcome: Progressing   

## 2018-02-13 NOTE — Telephone Encounter (Signed)
Thanks for update.  Appreciate it.  His breathing is a little bit better today.  We are continuing with current plan. Donato Schultz, MD

## 2018-02-13 NOTE — Progress Notes (Signed)
PROGRESS NOTE  Craig [name] ZOX:[id number] DOB: 06-21-1935 DOA: 02/08/2018 PCP: Ignatius Specking, MD  PCP: Ignatius Specking, MD          Brief Narrative by " Dr Antionette Char": Craig [name] a 83 y.o.malewith medical history significant forpermanent atrial fibrillation on Eliquis, coronary artery disease with recentNSTEMImedically managed,and chronic kidney disease stage III, now presenting with shortness of breath and a syncopal episode overnight. Patient had experienced chest pain approximately 2 weeks ago, eventually went into the ED 3 days later after pain had resolved, was found to have had NSTEMIwith troponin reaching 11, underwent catheterization on 01/31/2018, noted to have RCA occlusion, and medical management was recommended. He was discharged home on 02/04/2018, saw his cardiologist in clinic yesterday, seemed to be doing fairly well at that time, but suffered a syncopal episode early this morning when he got up to use the bathroom, and has been experiencing shortness of breath since that time. He denies any chest pain. Denies fevers, chills, or cough. Reports new onset of bilateral ankle edema.  [place]:Upon arrival to the ED, patient is found to beafebrile, saturating in the 80s on room air, and vitals otherwise stable. EKG features atrial fibrillation with T wave inversions. Chemistry panel is notable for glucose of 224 and CBC features macrocytosis without anemia. BNP was elevated to just over thousand and and troponin was elevated to 0.68. A second troponin was obtained and elevated further to 0.81. The patient was treated with 40 mg IV Lasix in the ED and supplemental oxygen. Transfer to [facility] was arranged for admission to the stepdown unit for ongoing evaluation and management of acute pulmonary edema with hypoxic respiratory failure.   Admitted with acute hypoxic respiratory failure, recent NSTEMI, respiratory failure thought to be related to  Heart failure exacerbation. Question of lung diseases.     HPI/Recap of past 24 hours:  He is sitting up in chair this am,  He is on 50% o2 supplement, No cough, no chest pain, no fever   on HFNC  Assessment/Plan: Principal Problem:   Acute pulmonary edema (HCC) Active Problems:   Coronary atherosclerosis of native coronary artery   Permanent atrial fibrillation (HCC)   Non-STEMI (non-ST elevated myocardial infarction) (HCC)   Acute respiratory failure with hypoxia (HCC)   Hyperglycemia   CKD (chronic kidney disease), stage III (HCC)   ILD (interstitial lung disease) (HCC)  Acute hypoxic respiratory failure -Unclear etiology, pulmonary edema versus pneumonitis -He is improving on IV IV Lasix , high-dose steroid, nebs , on HFNC -Cardiology and pulmonology following  Hypokalemia/hypomagnesemia: replaced, repeat lab in am  N-STEMI;  -he was hospitalized  From 4/18 to 4/23 to cardiology service for NSTEMI - has cardiac cath on 4/19 with Known occlusion of RCA on cath 4/19 with medical mgmt recommended -denies chest pain, Limited ECHO EF 50 % -Cardiology following.    A fib;  Remain in afib on metoprolol and eliquis.   CKD stage III  Monitor renal function on lasix.   Bph: continue cardura  Gout: stable on allopurinol   Code Status: full  Family Communication: patient   Disposition Plan: not ready to discharge   Consultants:  cardiology  Pulmonology/critical care  Procedures:  none  Antibiotics:  none   Objective: BP 125/80   Pulse (!) 104   Temp (!) 97.5 F (36.4 C) (Axillary)   Resp 18   Ht  (1.854 m)   Wt 78.8 kg (173 lb 11.2 oz)  SpO2 91%   BMI 22.92 kg/m   Intake/Output Summary (Last 24 hours) at 02/13/2018 1757 Last data filed at 02/13/2018 0647 Gross per 24 hour  Intake 240 ml  Output 1475 ml  Net -1235 ml   Filed Weights   02/11/18 0433 02/12/18 0407 02/13/18 0645  Weight: 79.4 kg (175 lb 0.7 oz) 79.8 kg (175 lb  14.4 oz) 78.8 kg (173 lb 11.2 oz)    Exam: Patient is examined daily including today on 02/13/2018, exams remain the same as of yesterday except that has changed    General:  NAD  Cardiovascular: IRRR  Respiratory: +crackles, no wheezing  Abdomen: Soft/ND/NT, positive BS  Musculoskeletal: No Edema  Neuro: alert, oriented   Data Reviewed: Basic Metabolic Panel: Recent Labs  Lab 02/09/18 1702 02/10/18 0453 02/11/18 0504 02/12/18 0313 02/13/18 0350  NA 141 142 142 142 140  K 3.4* 3.5 3.9 3.8 4.6  CL 101 103 104 98* 104  CO2 GLUCOSE 141* 126* 141* 138* 144*  BUN 21* 32* 46* 53* 53*  CREATININE 1.37* 1.38* 1.32* 1.39* 1.30*  CALCIUM 9.0 9.7 10.0 10.1 9.9  MG 1.8 2.4 2.4  --  2.7*   Liver Function Tests: Recent Labs  Lab 02/09/18 0449  AST 27  ALT 22  ALKPHOS 83  BILITOT 1.2  PROT 6.1*  ALBUMIN 2.5*   No results for input(s): LIPASE, AMYLASE in the last 168 hours. No results for input(s): AMMONIA in the last 168 hours. CBC: Recent Labs  Lab 02/09/18 0449 02/10/18 0453  WBC 11.3* 10.0  NEUTROABS 9.7*  --   HGB 13.2 13.4  HCT 39.1 40.3  MCV 101.8* 102.5*  PLT 291 315   Cardiac Enzymes:   Recent Labs  Lab 02/08/18 2224 02/09/18 0449 02/09/18 1015  TROPONINI 1.31* 1.19* 1.09*   BNP (last 3 results) Recent Labs    02/10/18 0453  BNP 647.3*    ProBNP (last 3 results) No results for input(s): PROBNP in the last 8760 hours.  CBG: Recent Labs  Lab 02/12/18 2347 02/13/18 0427 02/13/18 0753 02/13/18 1235 02/13/18 1627  GLUCAP 139* 156* 130* 169* 218*    Recent Results (from the past 240 hour(s))  MRSA PCR Screening     Status: None   Collection Time: 02/09/18  5:30 AM  Result Value Ref Range Status   MRSA by PCR NEGATIVE NEGATIVE Final    Comment:        The GeneXpert MRSA Assay (FDA approved for NASAL specimens only), is one component of a comprehensive MRSA colonization surveillance program. It is not intended to  diagnose MRSA infection nor to guide or monitor treatment for MRSA infections. Performed at [facility], [address], [place], [place] [address]      Studies: Dg Chest Port 1 View  Result Date: 02/13/2018 CLINICAL DATA:  Shortness of breath. EXAM: PORTABLE CHEST 1 VIEW COMPARISON:  Radiograph of February 10, 2018. FINDINGS: Stable cardiomediastinal silhouette. No pneumothorax or pleural effusion is noted. Slightly improved diffuse interstitial and airspace opacities are noted throughout both lungs suggesting improving infection or edema. Bony thorax is unremarkable. IMPRESSION: Slightly improved bilateral diffuse lung opacities suggesting improving edema or infection. Electronically Signed   By: Lupita Raider, M.D.   On: 02/13/2018 09:22    Scheduled Meds: . allopurinol  300 mg Oral QPM  . apixaban  5 mg Oral BID  . aspirin EC  81 mg Oral Daily  . atorvastatin  20 mg Oral q1800  . doxazosin  8 mg Oral Daily  . furosemide  40 mg Intravenous BID  . gemfibrozil  600 mg Oral BID  . insulin aspart  0-9 Units Subcutaneous Q4H  . ipratropium-albuterol  3 mL Nebulization TID  . liver oil-zinc oxide   Topical q morning - 10a  . methylPREDNISolone (SOLU-MEDROL) injection  125 mg Intravenous Q12H  . metoprolol succinate  12.5 mg Oral Daily  . [START ON 02/14/2018] potassium chloride  40 mEq Oral Daily  . sodium chloride flush  3 mL Intravenous Q12H    Continuous Infusions: . sodium chloride    . magnesium sulfate 1 - 4 g bolus IVPB       Time spent: I have personally reviewed and interpreted on  02/13/2018 daily labs, tele strips, imagings as discussed above under date review session and assessment and plans.  I reviewed all nursing notes, pharmacy notes, consultant notes,  vitals, pertinent old records  I have discussed plan of care as described above with RN , patient and family on 02/13/2018   Albertine Grates MD, PhD  [facility] Pager [phone]. If 7PM-7AM, please  contact night-coverage at [email or web address], password [facility] 02/13/2018, 5:57 PM  LOS: 5 days

## 2018-02-13 NOTE — Telephone Encounter (Signed)
I spoke with Dr. Anne Fu yesterday regarding Craig [name] and his current hospitalization.  I am sorry to hear that he has not been doing well.  Dr. Anne Fu is managing him while hospitalized and he also planned to access the current monitor strips for review.  I did look at these this morning and they show atrial fibrillation, there was one 3.4-second pause noted, although I am not sure that this necessarily explains the episode in question.  There was no ventricular tachycardia noted in the strips provided.  In speaking with Dr. Anne Fu, it sounds like the main focus now is trying to better understand the etiology of Craig [name]'s pulmonary status.

## 2018-02-13 NOTE — Progress Notes (Signed)
Progress Note  Patient Name: Craig [name] Date of Encounter: 02/13/2018  Primary Cardiologist: Nona Dell, MD   Subjective   Little better today. Still SOB but improved however. No CP.   Inpatient Medications    Scheduled Meds: . allopurinol  300 mg Oral QPM  . apixaban  5 mg Oral BID  . aspirin EC  81 mg Oral Daily  . atorvastatin  20 mg Oral q1800  . doxazosin  8 mg Oral Daily  . furosemide  40 mg Intravenous BID  . gemfibrozil  600 mg Oral BID  . insulin aspart  0-9 Units Subcutaneous Q4H  . ipratropium-albuterol  3 mL Nebulization TID  . methylPREDNISolone (SOLU-MEDROL) injection  125 mg Intravenous Q12H  . metoprolol succinate  12.5 mg Oral Daily  . potassium chloride  40 mEq Oral BID  . sodium chloride flush  3 mL Intravenous Q12H   Continuous Infusions: . sodium chloride    . magnesium sulfate 1 - 4 g bolus IVPB     PRN Meds: sodium chloride, acetaminophen, ALPRAZolam, magnesium hydroxide, menthol-cetylpyridinium, ondansetron (ZOFRAN) IV, sodium chloride flush   Vital Signs    Vitals:   02/13/18 0223 02/13/18 0645 02/13/18 0736 02/13/18 0754  BP:  126/82 126/82 123/75  Pulse: 90 78 85 82  Resp: (!) 21  Temp:    98 F (36.7 C)  TempSrc:    Oral  SpO2: 92% 91% 94% 92%  Weight:  173 lb 11.2 oz (78.8 kg)    Height:        Intake/Output Summary (Last 24 hours) at 02/13/2018 0934 Last data filed at 02/13/2018 0647 Gross per 24 hour  Intake 720 ml  Output 1475 ml  Net -755 ml   Filed Weights   02/11/18 0433 02/12/18 0407 02/13/18 0645  Weight: 175 lb 0.7 oz (79.4 kg) 175 lb 14.4 oz (79.8 kg) 173 lb 11.2 oz (78.8 kg)    Telemetry    Atrial fibrillation, rate controlled, no change- Personally Reviewed  ECG    Atrial fibrillation- Personally Reviewed  Physical Exam   GEN: Well nourished, well developed, mild increased WOB.   HEENT: normal  Neck: no JVD, carotid bruits, or masses Cardiac: RRR; no murmurs, rubs, or gallops,no edema    Respiratory:  increased work of breathing, mild crackles GI: soft, nontender, nondistended, + BS MS: no deformity or atrophy  Skin: warm and dry, no rash Neuro:  Alert and Oriented x 3, Strength and sensation are intact Psych: euthymic mood, full affect   Labs    Chemistry Recent Labs  Lab 02/09/18 0449  02/11/18 0504 02/12/18 0313 02/13/18 0350  NA 139   < > 142 142 140  K 3.1*   < > 3.9 3.8 4.6  CL 103   < > 104 98* 104  CO2 27   < > GLUCOSE 110*   < > 141* 138* 144*  BUN 19   < > 46* 53* 53*  CREATININE 1.25*   < > 1.32* 1.39* 1.30*  CALCIUM 9.2   < > 10.0 10.1 9.9  PROT 6.1*  --   --   --   --   ALBUMIN 2.5*  --   --   --   --   AST 27  --   --   --   --   ALT 22  --   --   --   --   ALKPHOS 83  --   --   --   --  BILITOT 1.2  --   --   --   --   GFRNONAA 53*   < > 49* 46* 50*  GFRAA >60   < > 57* 54* 58*  ANIONGAP 9   < > < > = values in this interval not displayed.     Hematology Recent Labs  Lab 02/09/18 0449 02/10/18 0453  WBC 11.3* 10.0  RBC 3.84* 3.93*  HGB 13.2 13.4  HCT 39.1 40.3  MCV 101.8* 102.5*  MCH 34.4* 34.1*  MCHC 33.8 33.3  RDW 14.1 14.0  PLT 291 315    Cardiac Enzymes Recent Labs  Lab 02/08/18 2224 02/09/18 0449 02/09/18 1015  TROPONINI 1.31* 1.19* 1.09*   No results for input(s): TROPIPOC in the last 168 hours.   BNP Recent Labs  Lab 02/10/18 0453  BNP 647.3*     DDimer No results for input(s): DDIMER in the last 168 hours.   Radiology    Dg Chest Port 1 View  Result Date: 02/13/2018 CLINICAL DATA:  Shortness of breath. EXAM: PORTABLE CHEST 1 VIEW COMPARISON:  Radiograph of February 10, 2018. FINDINGS: Stable cardiomediastinal silhouette. No pneumothorax or pleural effusion is noted. Slightly improved diffuse interstitial and airspace opacities are noted throughout both lungs suggesting improving infection or edema. Bony thorax is unremarkable. IMPRESSION: Slightly improved bilateral diffuse lung  opacities suggesting improving edema or infection. Electronically Signed   By: Lupita Raider, M.D.   On: 02/13/2018 09:22    Cardiac Studies    02/10/18 ECHO (repeat from 4/20 given change in clinical status): - Left ventricle: The cavity size was normal. There was mild focal basal hypertrophy of the septum. Systolic function was normal. The estimated ejection fraction was in the range of 55% to 60%. There is hypokinesis of the basalinferior myocardium. - Aortic valve: There was mild regurgitation. - Aortic root: The aortic root was mildly dilated. - Mitral valve: Calcified annulus. There was mild regurgitation. - Left atrium: The atrium was moderately dilated. - Right atrium: The atrium was moderately dilated. - Pulmonary arteries: PA peak pressure: 31 mm Hg (S).  Impressions:  - Hypokinesis of the inferobasal wall with overall preserved LV function; mild AI; mildly dilated aortic root; mild MR; moderate LAE; mild RAE; mild TR.     Patient Profile     83 y.o. male  with recent non-ST elevation myocardial infarction with cardiac catheterization on 01/31/2018 showing occluded proximal RCA with left-to-right collaterals, no PCI given occlusion for 4 days, here with hypoxia possibly from pulmonary edema, perhaps aspiration pneumonitis given that his current symptoms seem out of proportion to his normal ejection fraction   Assessment & Plan    Acute hypoxic respiratory failure - Likely a component of both pulmonary edema, and acute inflammatory pulmonary process.  Ejection fraction remains the same.  There is no mechanical failure of the heart i.e. VSD or severe mitral regurgitation post MI. -Continue with empiric treatment with both Lasix as well as IV steroids (now reduced). Out yesterday net -We may need to perform right heart catheterization to figure out whether or not this is cardiogenic or pulmonary if he does not improve, however he seems to be even better  today (in chair, less SOB).  He is on Eliquis.   -BUN and creatinine closely monitored  Recent non-ST elevation myocardial infarction with occluded proximal RCA with good left to right collaterals, coronary artery disease -Troponin peak was 13.  EF remains normal with basal  inferior hypokinesis. Stable  Permanent atrial fibrillation -On Eliquis. No bleeding  Syncope  - Dr. Diona Browner looked at monitor. Only 3 sec pause. Likely not reason for syncope. No VT   For questions or updates, please contact [facility] Please consult [email or web address] for contact info under Cardiology/STEMI.      Signed, Donato Schultz, MD  02/13/2018, 9:34 AM

## 2018-02-13 NOTE — Consult Note (Signed)
WOC Nurse wound consult note Reason for Consult: stage 2 to buttock Wound type: Consistent with a skin tear, not a pressure injury. Measurement:1.3 cm x 0.8 cm x no depth; just needs re-epithelialization. Wound bed: 100% pink, clean. Drainage (amount, consistency, odor) No dressing present on assessment, no odor, serosanginous drainage present on underpad that was in the chair where the patient was sitting. Periwound: Dark pink, intact, blanches Dressing procedure/placement/frequency: Desitin 40% daily, cover with a SMALL foam dressing.  Family asked me to look at the right elbow area.  Two foam dressings in place that the patient and family report were placed in [place].  Orders for this area include:  Gently cleanse with saline, pat dry, apply a foam dressing.  Change every 3 days. Monitor the wound area(s) for worsening of condition such as: Signs/symptoms of infection,  Increase in size,  Development of or worsening of odor, Development of pain, or increased pain at the affected locations.  Notify the medical team if any of these develop.  Thank you for the consult.  Discussed plan of care with the patient and bedside nurse.  WOC nurse will not follow at this time.  Please re-consult the WOC team if needed.  Helmut Muster, RN, MSN, CWOCN, CNS-BC, pager [phone]

## 2018-02-13 NOTE — Telephone Encounter (Signed)
Niece notified and verbalized understanding. Niece very Adult nurse.

## 2018-02-13 NOTE — Plan of Care (Signed)
Tolerating high flow 02 BNC, concentration decreased with sats remaining in low 90s, able to sit up in chair for 4 hours without complication.  Raymon Mutton RN

## 2018-02-13 NOTE — Progress Notes (Signed)
83 year old remote smoker with recent and STEMI and RCA occlusion on cath admitted 4/27 with elevated troponin and acute hypoxic respiratory failure. He was diuresed but  remained severely hypoxic.  He  transitioned off BiPAP  to high flow oxygen , he was started on high-dose steroids 3 days ago and has slowly tapered down to 60% high flow oxygen He was able to get out of bed to chair today. Continues to have bibasilar crackles on exam, no JVD or edema . Renal function is stable at 53/1.30. Unfortunately CT chest was not high-resolution-but this did suggest some underlying interstitial lung disease. Impression-I do feel that he has underlying interstitial lung disease, whic h is  either stabilizing or responding somewhat to steroids  Recommend -Continue 125 IV Solu-Medrol every 12 hours, once FiO2 drops less than 50% then we can drop steroids further -Maintain equal balance -Start PT efforts  Charlee Whitebread V. Vassie Loll MD [phone]

## 2018-02-14 LAB — BASIC METABOLIC PANEL
Anion gap: 13 (ref 5–15)
BUN: 58 mg/dL — AB (ref 6–20)
CALCIUM: 10 mg/dL (ref 8.9–10.3)
CO2: 29 mmol/L (ref 22–32)
CREATININE: 1.42 mg/dL — AB (ref 0.61–1.24)
Chloride: 99 mmol/L — ABNORMAL LOW (ref 101–111)
GFR calc Af Amer: 52 mL/min — ABNORMAL LOW (ref >60)
GFR, EST NON AFRICAN AMERICAN: 45 mL/min — AB (ref >60)
Glucose, Bld: 162 mg/dL — ABNORMAL HIGH (ref 65–99)
Potassium: 4.3 mmol/L (ref 3.5–5.1)
SODIUM: 141 mmol/L (ref 135–145)

## 2018-02-14 LAB — GLUCOSE, CAPILLARY
Glucose-Capillary: 142 mg/dL — ABNORMAL HIGH (ref 65–99)
Glucose-Capillary: 153 mg/dL — ABNORMAL HIGH (ref 65–99)
Glucose-Capillary: 154 mg/dL — ABNORMAL HIGH (ref 65–99)
Glucose-Capillary: 154 mg/dL — ABNORMAL HIGH (ref 65–99)
Glucose-Capillary: 158 mg/dL — ABNORMAL HIGH (ref 65–99)
Glucose-Capillary: 177 mg/dL — ABNORMAL HIGH (ref 65–99)

## 2018-02-14 LAB — ANTI-JO 1 ANTIBODY, IGG: Anti JO-1: <0.2 AI (ref 0.0–0.9)

## 2018-02-14 LAB — CYCLIC CITRUL PEPTIDE ANTIBODY, IGG/IGA: CCP Antibodies IgG/IgA: 6 units (ref 0–19)

## 2018-02-14 LAB — ANTIEXTRACTABLE NUCLEAR AG: ENA SM Ab Ser-aCnc: <0.2 AI (ref 0.0–0.9)

## 2018-02-14 MED ORDER — METHYLPREDNISOLONE SODIUM SUCC 125 MG IJ SOLR
80.0000 mg | Freq: Two times a day (BID) | INTRAMUSCULAR | Status: DC
  Administered 2018-02-14 – 2018-02-17 (×6): 80 mg via INTRAVENOUS
  Filled 2018-02-14 (×6): qty 2

## 2018-02-14 MED ORDER — DOXAZOSIN MESYLATE 4 MG PO TABS: 4.0000 mg | ORAL_TABLET | Freq: Every day | ORAL | Status: DC

## 2018-02-14 MED ORDER — INSULIN ASPART 100 UNIT/ML ~~LOC~~ SOLN
0.0000 [IU] | Freq: Three times a day (TID) | SUBCUTANEOUS | Status: DC
  Administered 2018-02-15: 2 [IU] via SUBCUTANEOUS
  Administered 2018-02-15: 1 [IU] via SUBCUTANEOUS
  Administered 2018-02-15: 3 [IU] via SUBCUTANEOUS
  Administered 2018-02-16 (×2): 2 [IU] via SUBCUTANEOUS
  Administered 2018-02-16: 1 [IU] via SUBCUTANEOUS
  Administered 2018-02-17 (×3): 2 [IU] via SUBCUTANEOUS
  Administered 2018-02-18 (×2): 1 [IU] via SUBCUTANEOUS
  Administered 2018-02-19: 2 [IU] via SUBCUTANEOUS
  Administered 2018-02-19 (×2): 1 [IU] via SUBCUTANEOUS
  Administered 2018-02-20: 2 [IU] via SUBCUTANEOUS
  Administered 2018-02-20: 1 [IU] via SUBCUTANEOUS
  Administered 2018-02-21 – 2018-02-23 (×5): 2 [IU] via SUBCUTANEOUS
  Administered 2018-02-23: 1 [IU] via SUBCUTANEOUS
  Administered 2018-02-24 (×2): 2 [IU] via SUBCUTANEOUS
  Administered 2018-02-25: 1 [IU] via SUBCUTANEOUS
  Administered 2018-02-25: 3 [IU] via SUBCUTANEOUS

## 2018-02-14 MED ORDER — SENNOSIDES-DOCUSATE SODIUM 8.6-50 MG PO TABS
1.0000 | ORAL_TABLET | Freq: Two times a day (BID) | ORAL | Status: DC
  Administered 2018-02-14 – 2018-02-26 (×22): 1 via ORAL
  Filled 2018-02-14 (×23): qty 1

## 2018-02-14 MED ORDER — NYSTATIN 100000 UNIT/ML MT SUSP
5.0000 mL | Freq: Four times a day (QID) | OROMUCOSAL | Status: DC
  Administered 2018-02-14 – 2018-02-26 (×45): 500000 [IU] via ORAL
  Filled 2018-02-14 (×44): qty 5

## 2018-02-14 NOTE — Evaluation (Signed)
Physical Therapy Evaluation Patient Details Name: Craig [name] MRN: [id number] DOB: 05-Dec-1934 Today's Date: 02/14/2018   History of Present Illness  Craig [name] is an 83yo white male who comes to [facility] on 4/27 from [facility] after SOB adn syncopal episode at home. Pt was admitted at [facility] just two weeks ago found to have NSTEMI. PMH: PAF on eliquis, CAD c recent NSTEMI, CKD3, Lt THA, multiple lumbar spine surgeries. Pt reports "3-4" falls in the last seevral months wherein he describes a LOB and then "wakes up" on the ground unaware of what happened.   Clinical Impression  Pt admitted with above diagnosis. Pt currently with functional limitations due to the deficits listed below (see "PT Problem List"). Upon entry, the patient is received semirecumbent in bed, nursing in room. The pt is awake and agreeable to participate. No acute distress noted at this time. The pt is alert and oriented x3, pleasant, conversational, and following simple commands with additional cuing, suspected d/t HOH. Pt has orthostatic drop and giddiness with transitional movements, RN made aware, see vtials below. All mobility performed at minGuardA to supervision, but AMB deferred d/t soft BP. Pt will benefit from skilled PT intervention to increase independence and safety with basic mobility in preparation for discharge to the venue listed below.    Position/Activity  BP(mm Hg)  Heartrate (BPM)  Flow Rate (L/min)   Supine, resting 93/63  100% @ 12L/min HFNC  Seated EOB @ 0 min 78/60  98% @ 12L/min HFNC  Seated EOB @ 2 min 95/61  96% @ 12L/min HFNC  s/p stand pivot to chair 79/59 112-120, irregular 92% @ 12L/min HFNC  In chair @ 2 min 108/59  92% /min HFNC       Follow Up Recommendations Home health PT    Equipment Recommendations  None recommended by PT    Recommendations for Other Services       Precautions / Restrictions Precautions Precautions: Fall Restrictions Weight Bearing Restrictions: No       Mobility  Bed Mobility Overal bed mobility: Modified Independent Bed Mobility: Supine to Sit     Supine to sit: Modified independent (Device/Increase time)     General bed mobility comments: +time/effort  Transfers Overall transfer level: Needs assistance Equipment used: 1 person hand held assist Transfers: Stand Pivot Transfers Sit to Stand: Min guard;Supervision         General transfer comment: min guard for safety, HR elevated, orthostatic with bed mobility   Ambulation/Gait Ambulation/Gait assistance: (unable to trial this date d/t soft BP )              Stairs            Wheelchair Mobility    Modified Rankin (Stroke Patients Only)       Balance Overall balance assessment: Modified Independent;History of Falls(multiple syncopal episodes PTA )                                           Pertinent Vitals/Pain Pain Assessment: No/denies pain    Home Living Family/patient expects to be discharged to:: Private residence Living Arrangements: Alone Available Help at Discharge: Family;Available PRN/intermittently Type of Home: House Home Access: Stairs to enter Entrance Stairs-Rails: None Entrance Stairs-Number of Steps: 1 Home Layout: One level Home Equipment: Walker - 2 wheels;Wheelchair - manual;Cane - single point;Crutches      Prior Function  Level of Independence: Independent               Hand Dominance        Extremity/Trunk Assessment                Communication   Communication: No difficulties  Cognition Arousal/Alertness: Awake/alert Behavior During Therapy: WFL for tasks assessed/performed Overall Cognitive Status: Within Functional Limits for tasks assessed                                        General Comments      Exercises Other Exercises Other Exercises: Seated Knee extension in chair: 1x10 bilat  Other Exercises: Seated Marching in chair: 1x10 bilat (SpO2  stable, HR increased with 128 peak, irregular)   Assessment/Plan    PT Assessment Patient needs continued PT services  PT Problem List Decreased activity tolerance;Decreased balance;Decreased mobility;Decreased coordination;Cardiopulmonary status limiting activity       PT Treatment Interventions Gait training;Stair training;Functional mobility training;Therapeutic activities;Therapeutic exercise;Balance training;Neuromuscular re-education;Patient/family education    PT Goals (Current goals can be found in the Care Plan section)  Acute Rehab PT Goals Patient Stated Goal: return home PT Goal Formulation: With patient Time For Goal Achievement: 02/28/18 Potential to Achieve Goals: Good    Frequency Min 3X/week   Barriers to discharge Decreased caregiver support      Co-evaluation               AM-PAC PT "6 Clicks" Daily Activity  Outcome Measure Difficulty turning over in bed (including adjusting bedclothes, sheets and blankets)?: A Little Difficulty moving from lying on back to sitting on the side of the bed? : A Little Difficulty sitting down on and standing up from a chair with arms (e.g., wheelchair, bedside commode, etc,.)?: A Little Help needed moving to and from a bed to chair (including a wheelchair)?: A Little Help needed walking in hospital room?: Total Help needed climbing 3-5 steps with a railing? : Total 6 Click Score: 14    End of Session Equipment Utilized During Treatment: Oxygen Activity Tolerance: Treatment limited secondary to medical complications (Comment)(ortostatic and giddy ) Patient left: in chair;with call bell/phone within reach;with chair alarm set;with family/visitor present Nurse Communication: Mobility status PT Visit Diagnosis: History of falling (Z91.81);Dizziness and giddiness (R42);Difficulty in walking, not elsewhere classified (R26.2)    Time: 1610-9604 PT Time Calculation (min) (ACUTE ONLY): 36 min   Charges:   PT  Evaluation $PT Eval High Complexity: 1 High PT Treatments $Therapeutic Activity: 8-22 mins   PT G Codes:        3:40 PM, 03-01-2018 Rosamaria Lints, PT, DPT Physical Therapist - [place] [phone] (Pager)  [phone] (Office)     Buccola,Allan C Mar 01, 2018, 3:30 PM

## 2018-02-14 NOTE — Progress Notes (Signed)
Called by Dr Dr Roda Shutters- pt is orthostatic. Will decrease Cardura and give Q HS and hold Lasix. MD to review in am.   Corine Shelter PA-C 02/14/2018 3:56 PM

## 2018-02-14 NOTE — Care Management Note (Addendum)
Case Management Note  Patient Details  Name: Craig [name] MRN: [id number] Date of Birth: [birth year]  Subjective/Objective: Pt presented for SOB- Hypoxic Respiratory Failure. Continues on IV Lasix. PTA from home alone in [place]. Pt states he has DME RW, Crutches and WC. PT recommendations for [facility] PT Services. Pt may benefit from [facility] as well. Pt continues on High Flow 02. Will monitor for home 02 needs.                     Action/Plan: [facility] Agency List provided to patient so he could think about which agency for [facility] Services. CM will continue to f/u over the weekend.   Expected Discharge Date:                  Expected Discharge Plan:  Home w Home Health Services  In-House Referral:  NA  Discharge planning Services  CM Consult  Post Acute Care Choice:  Home Health Choice offered to:  Patient  DME Arranged:   N/A DME Agency:   N/A  HH Arranged:  RN, PT HH Agency:   N/A  Status of Service:  Completed  If discussed at Long Length of Stay Meetings, dates discussed:  02-18-18, 02-20-18, 02-25-18  Additional Comments: 1419 02-25-18 Tomi Bamberger, RN,BSN [phone] CM did receive call from MD- Chest Tube pulled-pt will not meet eligibility for LTAC at this time. CM did speak with patient and sister-n-law in regards to disposition needs. Plan is for SNF- Family wants CSW to chech an additional place [employer] in [place]. No further needs from CM at this time.    1217 02-25-18 Tomi Bamberger, RN,BSN [phone] CM did discuss patient in the Quality  Collaborative Meeting this am. LTAC was discussed as an option for disposition since pt has Chest Tube. CM did discuss with MD- he states appropriate to discuss with patient. CM did discuss with patient and he asked me to call is sister-n-law. Unable to leave voicemail for sister-n-law. CM will continue to monitor.   02-19-18 Tomi Bamberger, RN,BSN [phone] Plan will be for SNF once  stable. CSW assisting with disposition needs. Pt continues on High Flow [place] at 10 L. CM will continue to monitor.  Gala Lewandowsky, RN 02/14/2018, 4:36 PM

## 2018-02-14 NOTE — Progress Notes (Signed)
Progress Note  Patient Name: Craig [name] Date of Encounter: 02/14/2018  Primary Cardiologist: Nona Dell, MD   Subjective   Still with shortness of breath but hopefully mildly improved again today.  No chest pain.  Inpatient Medications    Scheduled Meds: . allopurinol  300 mg Oral QPM  . apixaban  5 mg Oral BID  . aspirin EC  81 mg Oral Daily  . atorvastatin  20 mg Oral q1800  . doxazosin  8 mg Oral Daily  . furosemide  40 mg Intravenous BID  . gemfibrozil  600 mg Oral BID  . insulin aspart  0-9 Units Subcutaneous Q4H  . ipratropium-albuterol  3 mL Nebulization TID  . liver oil-zinc oxide   Topical q morning - 10a  . methylPREDNISolone (SOLU-MEDROL) injection  125 mg Intravenous Q12H  . metoprolol succinate  12.5 mg Oral Daily  . potassium chloride  40 mEq Oral Daily  . sodium chloride flush  3 mL Intravenous Q12H   Continuous Infusions: . sodium chloride    . magnesium sulfate 1 - 4 g bolus IVPB     PRN Meds: sodium chloride, acetaminophen, ALPRAZolam, magnesium hydroxide, menthol-cetylpyridinium, ondansetron (ZOFRAN) IV, phenol, sodium chloride, sodium chloride flush   Vital Signs    Vitals:   02/13/18 2214 02/14/18 0031 02/14/18 0131 02/14/18 0413  BP: 132/71 126/86  136/83  Pulse: 95 93 87 93  Resp:  Temp: 97.8 F (36.6 C) (!) 97.5 F (36.4 C)  97.6 F (36.4 C)  TempSrc: Oral Axillary  Oral  SpO2: 90% 92% 92% 90%  Weight:      Height:        Intake/Output Summary (Last 24 hours) at 02/14/2018 0721 Last data filed at 02/13/2018 2303 Gross per 24 hour  Intake 483 ml  Output 875 ml  Net -392 ml   Filed Weights   02/11/18 0433 02/12/18 0407 02/13/18 0645  Weight: 175 lb 0.7 oz (79.4 kg) 175 lb 14.4 oz (79.8 kg) 173 lb 11.2 oz (78.8 kg)    Telemetry    Atrial fibrillation, rate controlled, no change from prior, short bursts of nonsustained VT, 5 beats- Personally Reviewed  ECG    Atrial fibrillation- Personally Reviewed  Physical  Exam   GEN: Well nourished, well developed, in no acute distress  HEENT: normal  Neck: no JVD, carotid bruits, or masses Cardiac: RRR; no murmurs, rubs, or gallops,no edema  Respiratory: Mild crackles at bases, minimally increased work of breathing GI: soft, nontender, nondistended, + BS MS: no deformity or atrophy  Skin: warm and dry, no rash Neuro:  Alert and Oriented x 3, Strength and sensation are intact Psych: euthymic mood, full affect   Labs    Chemistry Recent Labs  Lab 02/09/18 0449  02/12/18 0313 02/13/18 0350 02/14/18 0542  NA 139   < > 142 140 141  K 3.1*   < > 3.8 4.6 4.3  CL 103   < > 98* 104 99*  CO2 27   < > GLUCOSE 110*   < > 138* 144* 162*  BUN 19   < > 53* 53* 58*  CREATININE 1.25*   < > 1.39* 1.30* 1.42*  CALCIUM 9.2   < > 10.1 9.9 10.0  PROT 6.1*  --   --   --   --   ALBUMIN 2.5*  --   --   --   --   AST 27  --   --   --   --  ALT 22  --   --   --   --   ALKPHOS 83  --   --   --   --   BILITOT 1.2  --   --   --   --   GFRNONAA 53*   < > 46* 50* 45*  GFRAA >60   < > 54* 58* 52*  ANIONGAP 9   < > < > = values in this interval not displayed.     Hematology Recent Labs  Lab 02/09/18 0449 02/10/18 0453  WBC 11.3* 10.0  RBC 3.84* 3.93*  HGB 13.2 13.4  HCT 39.1 40.3  MCV 101.8* 102.5*  MCH 34.4* 34.1*  MCHC 33.8 33.3  RDW 14.1 14.0  PLT 291 315    Cardiac Enzymes Recent Labs  Lab 02/08/18 2224 02/09/18 0449 02/09/18 1015  TROPONINI 1.31* 1.19* 1.09*   No results for input(s): TROPIPOC in the last 168 hours.   BNP Recent Labs  Lab 02/10/18 0453  BNP 647.3*     DDimer No results for input(s): DDIMER in the last 168 hours.   Radiology    Dg Chest Port 1 View  Result Date: 02/13/2018 CLINICAL DATA:  Shortness of breath. EXAM: PORTABLE CHEST 1 VIEW COMPARISON:  Radiograph of February 10, 2018. FINDINGS: Stable cardiomediastinal silhouette. No pneumothorax or pleural effusion is noted. Slightly improved diffuse  interstitial and airspace opacities are noted throughout both lungs suggesting improving infection or edema. Bony thorax is unremarkable. IMPRESSION: Slightly improved bilateral diffuse lung opacities suggesting improving edema or infection. Electronically Signed   By: Lupita Raider, M.D.   On: 02/13/2018 09:22    Cardiac Studies    02/10/18 ECHO (repeat from 4/20 given change in clinical status): - Left ventricle: The cavity size was normal. There was mild focal basal hypertrophy of the septum. Systolic function was normal. The estimated ejection fraction was in the range of 55% to 60%. There is hypokinesis of the basalinferior myocardium. - Aortic valve: There was mild regurgitation. - Aortic root: The aortic root was mildly dilated. - Mitral valve: Calcified annulus. There was mild regurgitation. - Left atrium: The atrium was moderately dilated. - Right atrium: The atrium was moderately dilated. - Pulmonary arteries: PA peak pressure: 31 mm Hg (S).  Impressions:  - Hypokinesis of the inferobasal wall with overall preserved LV function; mild AI; mildly dilated aortic root; mild MR; moderate LAE; mild RAE; mild TR.     Patient Profile     83 y.o. male  with recent non-ST elevation myocardial infarction with cardiac catheterization on 01/31/2018 showing occluded proximal RCA with left-to-right collaterals, no PCI given occlusion for 4 days, here with hypoxia possibly from pulmonary edema, perhaps aspiration pneumonitis given that his current symptoms seem out of proportion to his normal ejection fraction   Assessment & Plan    Acute hypoxic respiratory failure - Likely a component of both pulmonary edema, and acute inflammatory pulmonary process.  Ejection fraction remains the same.  There is no mechanical failure of the heart i.e. VSD or severe mitral regurgitation post MI. -Continue with empiric treatment with both Lasix as well as IV steroids.  Net output is 7.5  L -We may need to perform right heart catheterization to figure out whether or not this is cardiogenic or pulmonary if he does not improve, however he seems to be even better today (in chair, less SOB).  He is on Eliquis.  I think we can hold  off on this at this point. -BUN and creatinine closely monitored, BUN continues to increase slightly, creatinine as well but still in respectable range.  Obviously if over the weekend these continue to increase dramatically, stop diuresis.  Recent non-ST elevation myocardial infarction with occluded proximal RCA with good left to right collaterals, coronary artery disease -Troponin peak was 13.  EF remains normal with basal inferior hypokinesis.  No chest pain  Permanent atrial fibrillation -On Eliquis.  Well-controlled, no bleeding  Syncope  - Dr. Diona Browner looked at monitor. Only 3 sec pause. Likely not reason for syncope. No VT.  Debility -Continue with physical therapy.  For questions or updates, please contact [facility] Please consult [email or web address] for contact info under Cardiology/STEMI.      Signed, Donato Schultz, MD  02/14/2018, 7:21 AM

## 2018-02-14 NOTE — Progress Notes (Signed)
Weaned FIO2 10L HFNC for sat goal > 92%. Pt sitting in the chair. Pt in no distress. Pt tol well

## 2018-02-14 NOTE — Progress Notes (Signed)
PROGRESS NOTE  HOLSTON OYAMA AVW:[id number] DOB: August 03, 1935 DOA: 02/08/2018 PCP: Ignatius Specking, MD  PCP: Ignatius Specking, MD          Brief Narrative by " Dr Antionette Char": Craig [name] a 83 y.o.malewith medical history significant forpermanent atrial fibrillation on Eliquis, coronary artery disease with recentNSTEMImedically managed,and chronic kidney disease stage III, now presenting with shortness of breath and a syncopal episode overnight. Patient had experienced chest pain approximately 2 weeks ago, eventually went into the ED 3 days later after pain had resolved, was found to have had NSTEMIwith troponin reaching 11, underwent catheterization on 01/31/2018, noted to have RCA occlusion, and medical management was recommended. He was discharged home on 02/04/2018, saw his cardiologist in clinic yesterday, seemed to be doing fairly well at that time, but suffered a syncopal episode early this morning when he got up to use the bathroom, and has been experiencing shortness of breath since that time. He denies any chest pain. Denies fevers, chills, or cough. Reports new onset of bilateral ankle edema.  [place]:Upon arrival to the ED, patient is found to beafebrile, saturating in the 80s on room air, and vitals otherwise stable. EKG features atrial fibrillation with T wave inversions. Chemistry panel is notable for glucose of 224 and CBC features macrocytosis without anemia. BNP was elevated to just over thousand and and troponin was elevated to 0.68. A second troponin was obtained and elevated further to 0.81. The patient was treated with 40 mg IV Lasix in the ED and supplemental oxygen. Transfer to [facility] was arranged for admission to the stepdown unit for ongoing evaluation and management of acute pulmonary edema with hypoxic respiratory failure.   Admitted with acute hypoxic respiratory failure, recent NSTEMI, respiratory failure thought to be related to  Heart failure exacerbation. Question of lung diseases.     HPI/Recap of past 24 hours:  He is on HFNC 15liter, no fever, denies chest pain, no edema He reports intermittent dry cough, no cough noticed during enconter He reports throat is sore He reports being constipated, no ab pain.  Family at bedside   Assessment/Plan: Principal Problem:   Acute pulmonary edema (HCC) Active Problems:   Coronary atherosclerosis of native coronary artery   Permanent atrial fibrillation (HCC)   Non-STEMI (non-ST elevated myocardial infarction) (HCC)   Acute respiratory failure with hypoxia (HCC)   Hyperglycemia   CKD (chronic kidney disease), stage III (HCC)   ILD (interstitial lung disease) (HCC)  Acute hypoxic respiratory failure -Unclear etiology, pulmonary edema versus pneumonitis -He is improving on IV IV Lasix , high-dose steroid, nebs , on HFNC -Cardiology and pulmonology following, will follow recommendations  Oral thrush: topical nystatin solution  Hypokalemia/hypomagnesemia: replaced, repeat lab in am  N-STEMI;  -he was hospitalized  From 4/18 to 4/23 to cardiology service for NSTEMI - has cardiac cath on 4/19 with Known occlusion of RCA on cath 4/19 with medical mgmt recommended -denies chest pain, Limited ECHO EF 50 % -Cardiology following.    A fib;  Remain in afib on metoprolol and eliquis.   CKD stage III  Monitor renal function on lasix.   BPH: continue cardura  Gout: stable on allopurinol  FTT, reports lives by himself, had progressive weakness in the last few weeks, will get PT, he is very weak and deconditioned currently, may need LTAC vs snf eventually   Code Status: full  Family Communication: patient and family  Disposition Plan: not ready to discharge  Consultants:  cardiology  Pulmonology/critical care  Procedures:  none  Antibiotics:  none   Objective: BP 136/83 (BP Location: Left Arm)   Pulse 83   Temp 97.6 F (36.4 C)  (Oral)   Resp (!) 24   Ht  (1.854 m)   Wt 77.3 kg (170 lb 8 oz)   SpO2 93%   BMI 22.49 kg/m   Intake/Output Summary (Last 24 hours) at 02/14/2018 1153 Last data filed at 02/13/2018 2303 Gross per 24 hour  Intake 363 ml  Output 875 ml  Net -512 ml   Filed Weights   02/12/18 0407 02/13/18 0645 02/14/18 0749  Weight: 79.8 kg (175 lb 14.4 oz) 78.8 kg (173 lb 11.2 oz) 77.3 kg (170 lb 8 oz)    Exam: Patient is examined daily including today on 02/14/2018, exams remain the same as of yesterday except that has changed    General:  NAD, weak, +trush on tongue   Cardiovascular: IRRR  Respiratory: +crackles, no wheezing  Abdomen: Soft/ND/NT, positive BS  Musculoskeletal: No Edema  Neuro: alert, oriented   Data Reviewed: Basic Metabolic Panel: Recent Labs  Lab 02/09/18 1702 02/10/18 0453 02/11/18 0504 02/12/18 0313 02/13/18 0350 02/14/18 0542  NA 141 142 142 142 140 141  K 3.4* 3.5 3.9 3.8 4.6 4.3  CL 101 103 104 98* 104 99*  CO2 GLUCOSE 141* 126* 141* 138* 144* 162*  BUN 21* 32* 46* 53* 53* 58*  CREATININE 1.37* 1.38* 1.32* 1.39* 1.30* 1.42*  CALCIUM 9.0 9.7 10.0 10.1 9.9 10.0  MG 1.8 2.4 2.4  --  2.7*  --    Liver Function Tests: Recent Labs  Lab 02/09/18 0449  AST 27  ALT 22  ALKPHOS 83  BILITOT 1.2  PROT 6.1*  ALBUMIN 2.5*   No results for input(s): LIPASE, AMYLASE in the last 168 hours. No results for input(s): AMMONIA in the last 168 hours. CBC: Recent Labs  Lab 02/09/18 0449 02/10/18 0453  WBC 11.3* 10.0  NEUTROABS 9.7*  --   HGB 13.2 13.4  HCT 39.1 40.3  MCV 101.8* 102.5*  PLT 291 315   Cardiac Enzymes:   Recent Labs  Lab 02/08/18 2224 02/09/18 0449 02/09/18 1015 02/13/18 1818  CKTOTAL  --   --   --  27*  TROPONINI 1.31* 1.19* 1.09*  --    BNP (last 3 results) Recent Labs    02/10/18 0453  BNP 647.3*    ProBNP (last 3 results) No results for input(s): PROBNP in the last 8760 hours.  CBG: Recent Labs    Lab 02/13/18 2134 02/14/18 0027 02/14/18 0417 02/14/18 0750 02/14/18 1149  GLUCAP 117* 153* 158* 142* 177*    Recent Results (from the past 240 hour(s))  MRSA PCR Screening     Status: None   Collection Time: 02/09/18  5:30 AM  Result Value Ref Range Status   MRSA by PCR NEGATIVE NEGATIVE Final    Comment:        The GeneXpert MRSA Assay (FDA approved for NASAL specimens only), is one component of a comprehensive MRSA colonization surveillance program. It is not intended to diagnose MRSA infection nor to guide or monitor treatment for MRSA infections. Performed at [facility], [address], [place], [place] [address]      Studies: No results found.  Scheduled Meds: . allopurinol  300 mg Oral QPM  . apixaban  5 mg Oral BID  . aspirin  EC  81 mg Oral Daily  . atorvastatin  20 mg Oral q1800  . doxazosin  8 mg Oral Daily  . furosemide  40 mg Intravenous BID  . gemfibrozil  600 mg Oral BID  . insulin aspart  0-9 Units Subcutaneous Q4H  . ipratropium-albuterol  3 mL Nebulization TID  . liver oil-zinc oxide   Topical q morning - 10a  . methylPREDNISolone (SOLU-MEDROL) injection  80 mg Intravenous Q12H  . metoprolol succinate  12.5 mg Oral Daily  . nystatin  5 mL Oral QID  . potassium chloride  40 mEq Oral Daily  . senna-docusate  1 tablet Oral BID  . sodium chloride flush  3 mL Intravenous Q12H    Continuous Infusions: . sodium chloride    . magnesium sulfate 1 - 4 g bolus IVPB       Time spent: I have personally reviewed and interpreted on  02/14/2018 daily labs, tele strips, imagings as discussed above under date review session and assessment and plans.  I reviewed all nursing notes, pharmacy notes, consultant notes,  vitals, pertinent old records  I have discussed plan of care as described above with RN , patient and family on 02/14/2018   Albertine Grates MD, PhD  [facility] Pager [phone]. If 7PM-7AM, please contact night-coverage at  [email or web address], password [facility] 02/14/2018, 11:53 AM  LOS: 6 days

## 2018-02-14 NOTE — Progress Notes (Addendum)
83 year old remote smoker with recent and STEMI and RCA occlusion on cath admitted 4/27 with elevated troponin and acute hypoxic respiratory failure. He was diuresed but  remained severely hypoxic.  He  transitioned off BiPAP  to high flow oxygen , he was started on high-dose steroids  4/29 and has slowly tapered down  high flow oxygen  He is on 15 L high flow nasal cannula this morning and out of bed to chair. Exam-no JVD or edema, no accessory muscle use, fine crackles left base otherwise clear . Renal function -slight increase in creatinine from 1.3-1.4 Unfortunately CT chest was not high-resolution-but this did suggest some underlying interstitial lung disease. Impression-I do feel that he has underlying interstitial lung disease, whic h is  either stabilizing or responding somewhat to steroids  Recommend -Decrease to 80 mg IV Solu-Medrol every 12 hours, continue to drop FiO2 -ILD serology ordered -Maintain equal balance -Start PT efforts  PCCM to see again on Monday  please call for questions over the weekend as needed   Cathey Fredenburg V. Vassie Loll MD [phone]

## 2018-02-14 NOTE — Progress Notes (Signed)
Patient has not been wearing the BIPAP at this time it is PRN

## 2018-02-15 DIAGNOSIS — I482 Chronic atrial fibrillation: Secondary | ICD-10-CM

## 2018-02-15 LAB — COMPREHENSIVE METABOLIC PANEL
ALK PHOS: 87 U/L (ref 38–126)
ALT: 18 U/L (ref 17–63)
ANION GAP: 12 (ref 5–15)
AST: 22 U/L (ref 15–41)
Albumin: 2.7 g/dL — ABNORMAL LOW (ref 3.5–5.0)
BILIRUBIN TOTAL: 1.1 mg/dL (ref 0.3–1.2)
BUN: 58 mg/dL — ABNORMAL HIGH (ref 6–20)
CALCIUM: 9.8 mg/dL (ref 8.9–10.3)
CO2: 26 mmol/L (ref 22–32)
Chloride: 101 mmol/L (ref 101–111)
Creatinine, Ser: 1.45 mg/dL — ABNORMAL HIGH (ref 0.61–1.24)
GFR calc non Af Amer: 44 mL/min — ABNORMAL LOW (ref >60)
GFR, EST AFRICAN AMERICAN: 51 mL/min — AB (ref >60)
GLUCOSE: 168 mg/dL — AB (ref 65–99)
Potassium: 4.2 mmol/L (ref 3.5–5.1)
Sodium: 139 mmol/L (ref 135–145)
TOTAL PROTEIN: 6.2 g/dL — AB (ref 6.5–8.1)

## 2018-02-15 LAB — CBC
HEMATOCRIT: 49.4 % (ref 39.0–52.0)
HEMOGLOBIN: 16.9 g/dL (ref 13.0–17.0)
MCH: 34.9 pg — ABNORMAL HIGH (ref 26.0–34.0)
MCHC: 34.2 g/dL (ref 30.0–36.0)
MCV: 102.1 fL — ABNORMAL HIGH (ref 78.0–100.0)
Platelets: 278 10*3/uL (ref 150–400)
RBC: 4.84 MIL/uL (ref 4.22–5.81)
RDW: 13.7 % (ref 11.5–15.5)
WBC: 11.8 10*3/uL — ABNORMAL HIGH (ref 4.0–10.5)

## 2018-02-15 LAB — GLUCOSE, CAPILLARY
GLUCOSE-CAPILLARY: 149 mg/dL — AB (ref 65–99)
GLUCOSE-CAPILLARY: 212 mg/dL — AB (ref 65–99)
Glucose-Capillary: 160 mg/dL — ABNORMAL HIGH (ref 65–99)
Glucose-Capillary: 145 mg/dL — ABNORMAL HIGH (ref 65–99)

## 2018-02-15 LAB — MAGNESIUM: Magnesium: 2.7 mg/dL — ABNORMAL HIGH (ref 1.7–2.4)

## 2018-02-15 MED ORDER — POTASSIUM CHLORIDE CRYS ER 20 MEQ PO TBCR
20.0000 meq | EXTENDED_RELEASE_TABLET | Freq: Every day | ORAL | Status: DC
  Administered 2018-02-16 – 2018-02-17 (×2): 20 meq via ORAL
  Filled 2018-02-15 (×2): qty 1

## 2018-02-15 MED ORDER — BISACODYL 10 MG RE SUPP
10.0000 mg | Freq: Once | RECTAL | Status: AC
Start: 2018-02-15 — End: 2018-02-15
  Administered 2018-02-15: 10 mg via RECTAL
  Filled 2018-02-15: qty 1

## 2018-02-15 MED ORDER — METOPROLOL SUCCINATE ER 25 MG PO TB24
25.0000 mg | ORAL_TABLET | Freq: Every day | ORAL | Status: DC
  Administered 2018-02-16 – 2018-02-21 (×6): 25 mg via ORAL
  Filled 2018-02-15 (×6): qty 1

## 2018-02-15 MED ORDER — TAMSULOSIN HCL 0.4 MG PO CAPS
0.4000 mg | ORAL_CAPSULE | Freq: Every day | ORAL | Status: DC
Start: 2018-02-15 — End: 2018-02-26
  Administered 2018-02-15 – 2018-02-25 (×11): 0.4 mg via ORAL
  Filled 2018-02-15 (×11): qty 1

## 2018-02-15 NOTE — Progress Notes (Addendum)
PROGRESS NOTE  THADDEUS EVITTS AVW:[id number] DOB: [birth year] DOA: 02/08/2018 PCP: Ignatius Specking, MD  PCP: Ignatius Specking, MD          Brief Narrative by " Dr Antionette Char": Craig [name] D Hinchis a 83 y.o.malewith medical history significant forpermanent atrial fibrillation on Eliquis, coronary artery disease with recentNSTEMImedically managed,and chronic kidney disease stage III, now presenting with shortness of breath and a syncopal episode overnight. Patient had experienced chest pain approximately 2 weeks ago, eventually went into the ED 3 days later after pain had resolved, was found to have had NSTEMIwith troponin reaching 11, underwent catheterization on 01/31/2018, noted to have RCA occlusion, and medical management was recommended. He was discharged home on 02/04/2018, saw his cardiologist in clinic yesterday, seemed to be doing fairly well at that time, but suffered a syncopal episode early this morning when he got up to use the bathroom, and has been experiencing shortness of breath since that time. He denies any chest pain. Denies fevers, chills, or cough. Reports new onset of bilateral ankle edema.  [place]:Upon arrival to the ED, patient is found to beafebrile, saturating in the 80s on room air, and vitals otherwise stable. EKG features atrial fibrillation with T wave inversions. Chemistry panel is notable for glucose of 224 and CBC features macrocytosis without anemia. BNP was elevated to just over thousand and and troponin was elevated to 0.68. A second troponin was obtained and elevated further to 0.81. The patient was treated with 40 mg IV Lasix in the ED and supplemental oxygen. Transfer to [facility] was arranged for admission to the stepdown unit for ongoing evaluation and management of acute pulmonary edema with hypoxic respiratory failure.   Admitted with acute hypoxic respiratory failure, recent NSTEMI, respiratory failure thought to be related to  Heart failure exacerbation. Question of lung diseases.     HPI/Recap of past 24 hours:   He became significant orthostatic when he worked with PT yesterday at 3pm  He is feeling better today, He reports intermittent dry cough, throat is less sore no fever, denies chest pain, no edema  He reports being constipated, no ab pain.    Assessment/Plan: Principal Problem:   Acute pulmonary edema (HCC) Active Problems:   Coronary atherosclerosis of native coronary artery   Permanent atrial fibrillation (HCC)   Non-STEMI (non-ST elevated myocardial infarction) (HCC)   Acute respiratory failure with hypoxia (HCC)   Hyperglycemia   CKD (chronic kidney disease), stage III (HCC)   ILD (interstitial lung disease) (HCC)  Acute hypoxic respiratory failure -Unclear etiology, pulmonary edema versus pneumonitis -He is improving on IV IV Lasix , high-dose steroid, nebs , on HFNC -lasix discontinued on 5/3 due to significant orthostatic hypotension, elevated bun/cr -Cardiology and pulmonology following, will follow recommendations  Oral thrush: topical nystatin solution  Hypokalemia/hypomagnesemia: replaced, repeat lab in am  N-STEMI;  -he was hospitalized  From 4/18 to 4/23 to cardiology service for NSTEMI - has cardiac cath on 4/19 with Known occlusion of RCA on cath 4/19 with medical mgmt recommended -denies chest pain, Limited ECHO EF 50 % -Cardiology following.    A fib;  Remain in afib on metoprolol and eliquis.   CKD stage III  Monitor renal function on lasix.   BPH: d/c cardura, due to orthostatic hypotension, change to flomax  Gout: stable on allopurinol  FTT, reports lives by himself, had progressive weakness in the last few weeks, will get PT, he is very weak and deconditioned currently,  Code Status: full  Family Communication: patient , family updated over the phone  Disposition Plan: not ready to discharge, need cardiology and pulmonology  clearance   Consultants:  cardiology  Pulmonology/critical care  Procedures:  none  Antibiotics:  none   Objective: BP 133/84 (BP Location: Left Arm)   Pulse 84   Temp (!) 97.5 F (36.4 C) (Oral)   Resp 18   Ht  (1.854 m)   Wt 78.1 kg (172 lb 2.9 oz)   SpO2 90%   BMI 22.72 kg/m   Intake/Output Summary (Last 24 hours) at 02/15/2018 0759 Last data filed at 02/15/2018 4098 Gross per 24 hour  Intake 360 ml  Output 1825 ml  Net -1465 ml   Filed Weights   02/13/18 0645 02/14/18 0749 02/15/18 0505  Weight: 78.8 kg (173 lb 11.2 oz) 77.3 kg (170 lb 8 oz) 78.1 kg (172 lb 2.9 oz)    Exam: Patient is examined daily including today on 02/15/2018, exams remain the same as of yesterday except that has changed    General:  NAD, weak, +trush on tongue   Cardiovascular: IRRR  Respiratory: improved aeration, I did not hear crackles today, no wheezing  Abdomen: Soft/ND/NT, positive BS  Musculoskeletal: No Edema  Neuro: alert, oriented   Data Reviewed: Basic Metabolic Panel: Recent Labs  Lab 02/09/18 1702 02/10/18 0453 02/11/18 0504 02/12/18 0313 02/13/18 0350 02/14/18 0542 02/15/18 0452  NA 141 142 142 142 140 141 139  K 3.4* 3.5 3.9 3.8 4.6 4.3 4.2  CL 101 103 104 98* 104 99* 101  CO2 GLUCOSE 141* 126* 141* 138* 144* 162* 168*  BUN 21* 32* 46* 53* 53* 58* 58*  CREATININE 1.37* 1.38* 1.32* 1.39* 1.30* 1.42* 1.45*  CALCIUM 9.0 9.7 10.0 10.1 9.9 10.0 9.8  MG 1.8 2.4 2.4  --  2.7*  --  2.7*   Liver Function Tests: Recent Labs  Lab 02/09/18 0449 02/15/18 0452  AST 27 22  ALT 22 18  ALKPHOS 83 87  BILITOT 1.2 1.1  PROT 6.1* 6.2*  ALBUMIN 2.5* 2.7*   No results for input(s): LIPASE, AMYLASE in the last 168 hours. No results for input(s): AMMONIA in the last 168 hours. CBC: Recent Labs  Lab 02/09/18 0449 02/10/18 0453 02/15/18 0452  WBC 11.3* 10.0 11.8*  NEUTROABS 9.7*  --   --   HGB 13.2 13.4 16.9  HCT 39.1 40.3 49.4   MCV 101.8* 102.5* 102.1*  PLT 291 315 278   Cardiac Enzymes:   Recent Labs  Lab 02/08/18 2224 02/09/18 0449 02/09/18 1015 02/13/18 1818  CKTOTAL  --   --   --  27*  TROPONINI 1.31* 1.19* 1.09*  --    BNP (last 3 results) Recent Labs    02/10/18 0453  BNP 647.3*    ProBNP (last 3 results) No results for input(s): PROBNP in the last 8760 hours.  CBG: Recent Labs  Lab 02/14/18 0750 02/14/18 1149 02/14/18 1634 02/14/18 2145 02/15/18 0727  GLUCAP 142* 177* 154* 154* 149*    Recent Results (from the past 240 hour(s))  MRSA PCR Screening     Status: None   Collection Time: 02/09/18  5:30 AM  Result Value Ref Range Status   MRSA by PCR NEGATIVE NEGATIVE Final    Comment:        The GeneXpert MRSA Assay (FDA approved for NASAL specimens only), is one component of a comprehensive MRSA colonization  surveillance program. It is not intended to diagnose MRSA infection nor to guide or monitor treatment for MRSA infections. Performed at [facility], [address], [place], [place] [address]      Studies: No results found.  Scheduled Meds: . allopurinol  300 mg Oral QPM  . apixaban  5 mg Oral BID  . aspirin EC  81 mg Oral Daily  . atorvastatin  20 mg Oral q1800  . doxazosin  4 mg Oral QHS  . gemfibrozil  600 mg Oral BID  . insulin aspart  0-9 Units Subcutaneous TID WC  . ipratropium-albuterol  3 mL Nebulization TID  . liver oil-zinc oxide   Topical q morning - 10a  . methylPREDNISolone (SOLU-MEDROL) injection  80 mg Intravenous Q12H  . metoprolol succinate  12.5 mg Oral Daily  . nystatin  5 mL Oral QID  . potassium chloride  40 mEq Oral Daily  . senna-docusate  1 tablet Oral BID  . sodium chloride flush  3 mL Intravenous Q12H    Continuous Infusions: . sodium chloride    . magnesium sulfate 1 - 4 g bolus IVPB       Time spent: I have personally reviewed and interpreted on  02/15/2018 daily labs, tele strips, imagings as discussed above  under date review session and assessment and plans.  I reviewed all nursing notes, pharmacy notes, consultant notes,  vitals, pertinent old records  I have discussed plan of care as described above with RN , patient  on 02/15/2018   Albertine Grates MD, PhD  [facility] Pager [phone]. If 7PM-7AM, please contact night-coverage at [email or web address], password [facility] 02/15/2018, 7:59 AM  LOS: 7 days

## 2018-02-15 NOTE — Progress Notes (Signed)
Progress Note   Subjective   Doing well today, the patient denies CP.  Stable SOB.  No new concerns  Inpatient Medications    Scheduled Meds: . allopurinol  300 mg Oral QPM  . apixaban  5 mg Oral BID  . aspirin EC  81 mg Oral Daily  . atorvastatin  20 mg Oral q1800  . doxazosin  4 mg Oral QHS  . gemfibrozil  600 mg Oral BID  . insulin aspart  0-9 Units Subcutaneous TID WC  . ipratropium-albuterol  3 mL Nebulization TID  . liver oil-zinc oxide   Topical q morning - 10a  . methylPREDNISolone (SOLU-MEDROL) injection  80 mg Intravenous Q12H  . metoprolol succinate  12.5 mg Oral Daily  . nystatin  5 mL Oral QID  . potassium chloride  40 mEq Oral Daily  . senna-docusate  1 tablet Oral BID  . sodium chloride flush  3 mL Intravenous Q12H   Continuous Infusions: . sodium chloride    . magnesium sulfate 1 - 4 g bolus IVPB     PRN Meds: sodium chloride, acetaminophen, ALPRAZolam, magnesium hydroxide, menthol-cetylpyridinium, ondansetron (ZOFRAN) IV, phenol, sodium chloride, sodium chloride flush   Vital Signs    Vitals:   02/14/18 2141 02/15/18 0030 02/15/18 0505 02/15/18 0757  BP: 133/70 120/62 128/68 133/84  Pulse: 93 99 91 84  Resp: Temp: (!) 97.5 F (36.4 C) (!) 97.5 F (36.4 C) (!) 97.4 F (36.3 C) (!) 97.5 F (36.4 C)  TempSrc: Oral Oral Oral Oral  SpO2: 93% (!) 84% 96% 90%  Weight:   172 lb 2.9 oz (78.1 kg)   Height:        Intake/Output Summary (Last 24 hours) at 02/15/2018 1023 Last data filed at 02/15/2018 0943 Gross per 24 hour  Intake 240 ml  Output 1825 ml  Net -1585 ml   Filed Weights   02/13/18 0645 02/14/18 0749 02/15/18 0505  Weight: 173 lb 11.2 oz (78.8 kg) 170 lb 8 oz (77.3 kg) 172 lb 2.9 oz (78.1 kg)    Telemetry    Afib, V rates mostly < 100 bpm - Personally Reviewed  Physical Exam   GEN- The patient is elderly and frail appearing, sleeping but rouses Head- normocephalic, atraumatic Eyes-  Sclera clear, conjunctiva  pink Ears- hearing intact Oropharynx- clear with dry MM Neck- supple, Lungs- Clear to ausculation bilaterally, normal work of breathing Heart- irregular rate and rhythm  GI- soft, NT, ND, + BS Extremities- no clubbing, cyanosis, or edema  MS- n+ atrophy Skin- no rash or lesion  Labs    Chemistry Recent Labs  Lab 02/09/18 0449  02/13/18 0350 02/14/18 0542 02/15/18 0452  NA 139   < > 140 141 139  K 3.1*   < > 4.6 4.3 4.2  CL 103   < > 104 99* 101  CO2 27   < > GLUCOSE 110*   < > 144* 162* 168*  BUN 19   < > 53* 58* 58*  CREATININE 1.25*   < > 1.30* 1.42* 1.45*  CALCIUM 9.2   < > 9.9 10.0 9.8  PROT 6.1*  --   --   --  6.2*  ALBUMIN 2.5*  --   --   --  2.7*  AST 27  --   --   --  22  ALT 22  --   --   --  18  ALKPHOS 83  --   --   --  87  BILITOT 1.2  --   --   --  1.1  GFRNONAA 53*   < > 50* 45* 44*  GFRAA >60   < > 58* 52* 51*  ANIONGAP 9   < > < > = values in this interval not displayed.     Hematology Recent Labs  Lab 02/09/18 0449 02/10/18 0453 02/15/18 0452  WBC 11.3* 10.0 11.8*  RBC 3.84* 3.93* 4.84  HGB 13.2 13.4 16.9  HCT 39.1 40.3 49.4  MCV 101.8* 102.5* 102.1*  MCH 34.4* 34.1* 34.9*  MCHC 33.8 33.3 34.2  RDW 14.1 14.0 13.7  PLT 291 315 278    Cardiac Enzymes Recent Labs  Lab 02/08/18 2224 02/09/18 0449 02/09/18 1015  TROPONINI 1.31* 1.19* 1.09*   No results for input(s): TROPIPOC in the last 168 hours.      Patient Profile     83 y.o. male with recent non-ST elevation myocardial infarction with cardiac catheterization on 01/31/2018 showing occluded proximal RCA with left-to-right collaterals, no PCI given occlusion for 4 days, here with hypoxia possibly from pulmonary edema, perhaps aspiration pneumonitis given that his current symptoms seem out of proportion to his normal ejection fraction  Assessment & Plan    1.  Acute hypoxic respiratory failure Appears dry on exam. I do not believe additional diuresis would  be beneficial at this time Further management of nonCV causes per primary team  2. CAD with recent NSTEMI Stable No change required today  3. Permanent atrial fibrillation On eliquis Increase metoprolol to  daily  4. Syncope Stable No further workup planned  Cardiology to see as needed over the weekend.  Hillis Range MD, [facility] 02/15/2018 10:23 AM

## 2018-02-15 NOTE — Plan of Care (Signed)
Monitor Fluid/Volume status and Cardiac Rhythm.

## 2018-02-16 LAB — BASIC METABOLIC PANEL
ANION GAP: 13 (ref 5–15)
BUN: 61 mg/dL — ABNORMAL HIGH (ref 6–20)
CALCIUM: 9.9 mg/dL (ref 8.9–10.3)
CO2: 25 mmol/L (ref 22–32)
Chloride: 100 mmol/L — ABNORMAL LOW (ref 101–111)
Creatinine, Ser: 1.46 mg/dL — ABNORMAL HIGH (ref 0.61–1.24)
GFR calc Af Amer: 51 mL/min — ABNORMAL LOW (ref >60)
GFR, EST NON AFRICAN AMERICAN: 44 mL/min — AB (ref >60)
Glucose, Bld: 166 mg/dL — ABNORMAL HIGH (ref 65–99)
Potassium: 4.6 mmol/L (ref 3.5–5.1)
Sodium: 138 mmol/L (ref 135–145)

## 2018-02-16 LAB — CBC
HEMATOCRIT: 50.6 % (ref 39.0–52.0)
Hemoglobin: 17.1 g/dL — ABNORMAL HIGH (ref 13.0–17.0)
MCH: 34.6 pg — ABNORMAL HIGH (ref 26.0–34.0)
MCHC: 33.8 g/dL (ref 30.0–36.0)
MCV: 102.4 fL — ABNORMAL HIGH (ref 78.0–100.0)
PLATELETS: 271 10*3/uL (ref 150–400)
RBC: 4.94 MIL/uL (ref 4.22–5.81)
RDW: 13.6 % (ref 11.5–15.5)
WBC: 16.9 10*3/uL — ABNORMAL HIGH (ref 4.0–10.5)

## 2018-02-16 LAB — GLUCOSE, CAPILLARY
Glucose-Capillary: 166 mg/dL — ABNORMAL HIGH (ref 65–99)
Glucose-Capillary: 167 mg/dL — ABNORMAL HIGH (ref 65–99)
Glucose-Capillary: 144 mg/dL — ABNORMAL HIGH (ref 65–99)
Glucose-Capillary: 118 mg/dL — ABNORMAL HIGH (ref 65–99)

## 2018-02-16 MED ORDER — IPRATROPIUM BROMIDE 0.02 % IN SOLN
0.5000 mg | Freq: Three times a day (TID) | RESPIRATORY_TRACT | Status: DC
Start: 2018-02-16 — End: 2018-02-17
  Administered 2018-02-16 – 2018-02-17 (×4): 0.5 mg via RESPIRATORY_TRACT
  Filled 2018-02-16 (×5): qty 2.5

## 2018-02-16 MED ORDER — FLEET ENEMA 7-19 GM/118ML RE ENEM: 1.0000 | ENEMA | Freq: Once | RECTAL | Status: DC

## 2018-02-16 MED ORDER — LEVALBUTEROL HCL 1.25 MG/0.5ML IN NEBU
1.2500 mg | INHALATION_SOLUTION | Freq: Three times a day (TID) | RESPIRATORY_TRACT | Status: DC
Start: 2018-02-16 — End: 2018-02-17
  Administered 2018-02-16 – 2018-02-17 (×4): 1.25 mg via RESPIRATORY_TRACT
  Filled 2018-02-16 (×5): qty 0.5

## 2018-02-16 MED ORDER — GEMFIBROZIL 600 MG PO TABS
600.0000 mg | ORAL_TABLET | Freq: Two times a day (BID) | ORAL | Status: DC
  Administered 2018-02-16 – 2018-02-26 (×21): 600 mg via ORAL
  Filled 2018-02-16 (×22): qty 1

## 2018-02-16 MED ORDER — ARTIFICIAL TEARS OPHTHALMIC OINT
TOPICAL_OINTMENT | OPHTHALMIC | Status: DC | PRN
  Administered 2018-02-16: 11:00:00 via OPHTHALMIC
  Filled 2018-02-16 (×2): qty 3.5

## 2018-02-16 NOTE — Plan of Care (Signed)
HR controlled in AFib in 90's. 10 bts Wide QRS today. Asymptomatic. Adjusting BB. O2@15  L HFNC, increased from 10 L this am for low sats. Nebs ordered.

## 2018-02-16 NOTE — Progress Notes (Signed)
Sats  92 - 93 % on 15 liter HFNC after Neb tx. Continues with some labored breathing but denies SOB. Dr. Roda Shutters notified.

## 2018-02-16 NOTE — Progress Notes (Signed)
Progress Note  Patient Name: Craig [name] Date of Encounter: 02/16/2018  Primary Cardiologist: Diona Browner  Subjective   Feels lousy this morning. Long night of watery diarrhea. Was constipated prior. Back on HFNC. Feels like his SOB is about the same as when he was admitted. Remains in Afib with RVR with heart rates in the 110s to 120s bpm. No chest pain. Did not sleep much given diarrhea overnight, though reports when he was able to lay down the head of the bed was almost completely flat. Renal function stable.   Inpatient Medications    Scheduled Meds: . allopurinol  300 mg Oral QPM  . apixaban  5 mg Oral BID  . aspirin EC  81 mg Oral Daily  . atorvastatin  20 mg Oral q1800  . gemfibrozil  600 mg Oral BID AC  . insulin aspart  0-9 Units Subcutaneous TID WC  . ipratropium-albuterol  3 mL Nebulization TID  . liver oil-zinc oxide   Topical q morning - 10a  . methylPREDNISolone (SOLU-MEDROL) injection  80 mg Intravenous Q12H  . metoprolol succinate  25 mg Oral Daily  . nystatin  5 mL Oral QID  . potassium chloride  20 mEq Oral Daily  . senna-docusate  1 tablet Oral BID  . sodium chloride flush  3 mL Intravenous Q12H  . sodium phosphate  1 enema Rectal Once  . tamsulosin  0.4 mg Oral QPC supper   Continuous Infusions: . sodium chloride    . magnesium sulfate 1 - 4 g bolus IVPB     PRN Meds: sodium chloride, acetaminophen, ALPRAZolam, magnesium hydroxide, menthol-cetylpyridinium, ondansetron (ZOFRAN) IV, phenol, sodium chloride, sodium chloride flush   Vital Signs    Vitals:   02/16/18 0600 02/16/18 0757 02/16/18 0810 02/16/18 0816  BP:   122/78   Pulse: 81  (!) 105 (!) 111  Resp: 20  (!) 23 19  Temp:   (!) 97.5 F (36.4 C)   TempSrc:   Oral   SpO2: 92% 90% 92% 93%  Weight:      Height:        Intake/Output Summary (Last 24 hours) at 02/16/2018 0841 Last data filed at 02/16/2018 4098 Gross per 24 hour  Intake 956 ml  Output 550 ml  Net 406 ml   Filed Weights   02/14/18 0749 02/15/18 0505 02/16/18 0447  Weight: 170 lb 8 oz (77.3 kg) 172 lb 2.9 oz (78.1 kg) 170 lb 3.2 oz (77.2 kg)    Telemetry    Afib with RVR, low 100s to 120s bpm, occasional PVCs in couplet formation  - Personally Reviewed  ECG    n/a - Personally Reviewed  Physical Exam   GEN: Elderly and frail appearing; No acute distress.   Neck: No JVD. Cardiac: Tachycardic, irregularly irregular, I/VI systolic murmur at the apex, no rubs, or gallops.  Respiratory: Diminished breath sounds bilaterally with faint bibasilar crackles.  GI: Soft, nontender, non-distended.   MS: No edema; No deformity. Neuro:  Alert and oriented x 3; Nonfocal.  Psych: Normal affect.  Labs    Chemistry Recent Labs  Lab 02/14/18 0542 02/15/18 0452 02/16/18 0407  NA 141 139 138  K 4.3 4.2 4.6  CL 99* 101 100*  CO2 GLUCOSE 162* 168* 166*  BUN 58* 58* 61*  CREATININE 1.42* 1.45* 1.46*  CALCIUM 10.0 9.8 9.9  PROT  --  6.2*  --   ALBUMIN  --  2.7*  --  AST  --  22  --   ALT  --  18  --   ALKPHOS  --  87  --   BILITOT  --  1.1  --   GFRNONAA 45* 44* 44*  GFRAA 52* 51* 51*  ANIONGAP Hematology Recent Labs  Lab 02/10/18 0453 02/15/18 0452 02/16/18 0407  WBC 10.0 11.8* 16.9*  RBC 3.93* 4.84 4.94  HGB 13.4 16.9 17.1*  HCT 40.3 49.4 50.6  MCV 102.5* 102.1* 102.4*  MCH 34.1* 34.9* 34.6*  MCHC 33.3 34.2 33.8  RDW 14.0 13.7 13.6  PLT 315 278 271    Cardiac Enzymes Recent Labs  Lab 02/09/18 1015  TROPONINI 1.09*   No results for input(s): TROPIPOC in the last 168 hours.   BNP Recent Labs  Lab 02/10/18 0453  BNP 647.3*     DDimer No results for input(s): DDIMER in the last 168 hours.   Radiology    No results found.  Cardiac Studies   LHC 01/31/2018: Conclusion     Mid Cx lesion is 25% stenosed.  Mid LAD lesion is 25% stenosed.  Prox LAD lesion is 25% stenosed.  Prox RCA lesion is 100% stenosed. Large dominant vessel. There are left to  right collaterals. Culprit lesion.  There is no aortic valve stenosis.  LV end diastolic pressure is mildly elevated.   Medical therapy.  RCA occlusion occurred on Monday based on symptoms.  No angina in over 24 hours.  Occlusion looks organized and there would be high risk of distal embolization attempt at intervention since the vessel is likely been occluded for 4 days.  He may be having some heart rate issues due to the occluded RCA.  Hopefully, these will resolve.    Restart heparin in 6 hours. If the patient had further anginal symptoms in the future, could consider PCI at a later time.      TTE 02/10/2018: Study Conclusions  - Left ventricle: The cavity size was normal. There was mild focal   basal hypertrophy of the septum. Systolic function was normal.   The estimated ejection fraction was in the range of 55% to 60%.   There is hypokinesis of the basalinferior myocardium. - Aortic valve: There was mild regurgitation. - Aortic root: The aortic root was mildly dilated. - Mitral valve: Calcified annulus. There was mild regurgitation. - Left atrium: The atrium was moderately dilated. - Right atrium: The atrium was moderately dilated. - Pulmonary arteries: PA peak pressure: 31 mm Hg (S).  Impressions:  - Hypokinesis of the inferobasal wall with overall preserved LV   function; mild AI; mildly dilated aortic root; mild MR; moderate   LAE; mild RAE; mild TR.  Patient Profile     83 y.o. male with history of CAD with recent NSTEMI with cardiac catheterization on 01/31/2018 showing occluded proximal RCA with left-to-right collaterals, no PCI given occlusion for 4 days who was admitted with hypoxia possibly from pulmonary edema vs aspiration pneumonitis.  Assessment & Plan    1. Acute respiratory failure with hypoxia: -Continues to require HFNC  -Uncertain etiology, question of pulmonary edema vs aspiration PNA -Has received IV Lasix, last dose 5/3 with a documented UOP of  8.6 L for the admission -Weight down 2 pounds from 5/4 (172-->170), which is down 30 pounds from documented weight on 02/04/2018 (uncertain of accuracy) -Continues to receive nebulizer therapy and steroids  -May benefit from resuming gentle diuresis, discuss with MD  2. CAD  with recent NSTEMI medically managed as above: -Currently, without chest pain -Recent LHC 01/31/18 as above -If dyspnea persists with improvement in ventricular rates and pulmonary function he may require repeat LHC, though ideally would like to avoid -ASA -Would need wash out of Eliquis if LHC is revisited   3. Permanent Afib with RVR: -Ventricular rates currently in the 110s to 120s bpm -Toprol XL was escalated to 25 mg daily on 5/4 -Given pulmonary status, consider adding short-acting diltiazem for added rate control -Eliquis 5 mg bid given CHADS2VASc of at least 4 (HTN, age x 2, vascular disease)  4. Syncope: -No further episodes  -Monitor on telemetry -Recent outpatient cardiac monitoring noted  5. CKD stage II: -Renal function stable  6. Leukocytosis: -Steroids likely contributing    For questions or updates, please contact [facility] Please consult [email or web address] for contact info under Cardiology/STEMI.    Signed, Eula Listen, PA-C [facility] Pager: [phone] 02/16/2018, 8:41 AM

## 2018-02-16 NOTE — Care Management (Signed)
Pt and family state that they want patient to go to a rehab facility in [place], [place].  They do not want patient to return home alone at this time, even with [facility] services.  They would also like to consider CIR if patient qualifies. Will need further PT assessment.  CSW consult ordered for SNF placement.   Dudley Major is patient's sister-in-law and can be reached by phone in the morning [phone]) or will be here Monday around 12 to discuss.

## 2018-02-16 NOTE — Progress Notes (Signed)
PROGRESS NOTE  DEAIRE MCWHIRTER ZOX:[id number] DOB: 1935-03-29 DOA: 02/08/2018 PCP: Ignatius Specking, MD  PCP: Ignatius Specking, MD          Brief Narrative by " Dr Antionette Char": Marina Goodell Craig [name] a 83 y.o.malewith medical history significant forpermanent atrial fibrillation on Eliquis, coronary artery disease with recentNSTEMImedically managed,and chronic kidney disease stage III, now presenting with shortness of breath and a syncopal episode overnight. Patient had experienced chest pain approximately 2 weeks ago, eventually went into the ED 3 days later after pain had resolved, was found to have had NSTEMIwith troponin reaching 11, underwent catheterization on 01/31/2018, noted to have RCA occlusion, and medical management was recommended. He was discharged home on 02/04/2018, saw his cardiologist in clinic yesterday, seemed to be doing fairly well at that time, but suffered a syncopal episode early this morning when he got up to use the bathroom, and has been experiencing shortness of breath since that time. He denies any chest pain. Denies fevers, chills, or cough. Reports new onset of bilateral ankle edema.  [place]:Upon arrival to the ED, patient is found to beafebrile, saturating in the 80s on room air, and vitals otherwise stable. EKG features atrial fibrillation with T wave inversions. Chemistry panel is notable for glucose of 224 and CBC features macrocytosis without anemia. BNP was elevated to just over thousand and and troponin was elevated to 0.68. A second troponin was obtained and elevated further to 0.81. The patient was treated with 40 mg IV Lasix in the ED and supplemental oxygen. Transfer to [facility] was arranged for admission to the stepdown unit for ongoing evaluation and management of acute pulmonary edema with hypoxic respiratory failure.   Admitted with acute hypoxic respiratory failure, recent NSTEMI, respiratory failure thought to be related to  Heart failure exacerbation. Question of lung diseases.     HPI/Recap of past 24 hours:  His sats dropped to mid 80's on HFNC 10liters while getting up to use the bedside commode,  He received nebs and now back on HFNC15liter, sat's in the 90's   He reports intermittent dry cough, throat is less sore, no fever, denies chest pain, no edema  bm x2     Assessment/Plan: Principal Problem:   Acute pulmonary edema (HCC) Active Problems:   Coronary atherosclerosis of native coronary artery   Permanent atrial fibrillation (HCC)   Non-STEMI (non-ST elevated myocardial infarction) (HCC)   Acute respiratory failure with hypoxia (HCC)   Hyperglycemia   CKD (chronic kidney disease), stage III (HCC)   ILD (interstitial lung disease) (HCC)  Acute hypoxic respiratory failure -Unclear etiology, pulmonary edema versus pneumonitis -He is improving slowly on  IV Lasix , high-dose steroid, nebs , on HFNC -lasix discontinued on 5/3 due to significant orthostatic hypotension, elevated bun/cr -has tachycardia, change duoneb to xopenex/atrovent. -Cardiology and pulmonology following, will follow recommendations  Leukocytosis:  From steroids? hemoconcentration from lasix? No fever,  monitor  N-STEMI;  -he was hospitalized  From 4/18 to 4/23 to cardiology service for NSTEMI - has cardiac cath on 4/19 with Known occlusion of RCA on cath 4/19 with medical mgmt recommended -denies chest pain, Limited ECHO EF 50 % -Cardiology following.    A fib;  Remain in afib on metoprolol and eliquis.  Still has tachycardia, change duoneb to xopenex/atrovent.  Oral thrush: topical nystatin solution, improving  Hypokalemia/hypomagnesemia: resolved. monitor  CKD stage III  Monitor renal function on lasix.   BPH: Craig/c cardura, due to orthostatic hypotension, change  to flomax  Gout: stable on allopurinol  FTT, reports lives by himself, had progressive weakness in the last few weeks,  PT eval, he is  very weak and deconditioned currently,    Code Status: full  Family Communication: patient   Disposition Plan: not ready to discharge, need cardiology and pulmonology clearance   Consultants:  cardiology  Pulmonology/critical care  Procedures:  none  Antibiotics:  none   Objective: BP 122/78 (BP Location: Left Arm)   Pulse (!) 105   Temp (!) 97.5 F (36.4 C) (Oral)   Resp (!) 23   Ht  (1.854 m)   Wt 77.2 kg (170 lb 3.2 oz)   SpO2 92%   BMI 22.46 kg/m   Intake/Output Summary (Last 24 hours) at 02/16/2018 0813 Last data filed at 02/16/2018 0647 Gross per 24 hour  Intake 956 ml  Output 550 ml  Net 406 ml   Filed Weights   02/14/18 0749 02/15/18 0505 02/16/18 0447  Weight: 77.3 kg (170 lb 8 oz) 78.1 kg (172 lb 2.9 oz) 77.2 kg (170 lb 3.2 oz)    Exam: Patient is examined daily including today on 02/16/2018, exams remain the same as of yesterday except that has changed    General:  NAD, weak, trush on tongue is resolving  Cardiovascular: IRRR  Respiratory: improved aeration, I did not hear crackles today, no wheezing  Abdomen: Soft/ND/NT, positive BS  Musculoskeletal: No Edema  Neuro: alert, oriented   Data Reviewed: Basic Metabolic Panel: Recent Labs  Lab 02/09/18 1702 02/10/18 0453 02/11/18 0504 02/12/18 0313 02/13/18 0350 02/14/18 0542 02/15/18 0452 02/16/18 0407  NA 141 142 142 142 140 141 139 138  K 3.4* 3.5 3.9 3.8 4.6 4.3 4.2 4.6  CL 101 103 104 98* 104 99* 101 100*  CO2 GLUCOSE 141* 126* 141* 138* 144* 162* 168* 166*  BUN 21* 32* 46* 53* 53* 58* 58* 61*  CREATININE 1.37* 1.38* 1.32* 1.39* 1.30* 1.42* 1.45* 1.46*  CALCIUM 9.0 9.7 10.0 10.1 9.9 10.0 9.8 9.9  MG 1.8 2.4 2.4  --  2.7*  --  2.7*  --    Liver Function Tests: Recent Labs  Lab 02/15/18 0452  AST 22  ALT 18  ALKPHOS 87  BILITOT 1.1  PROT 6.2*  ALBUMIN 2.7*   No results for input(s): LIPASE, AMYLASE in the last 168 hours. No results for  input(s): AMMONIA in the last 168 hours. CBC: Recent Labs  Lab 02/10/18 0453 02/15/18 0452 02/16/18 0407  WBC 10.0 11.8* 16.9*  HGB 13.4 16.9 17.1*  HCT 40.3 49.4 50.6  MCV 102.5* 102.1* 102.4*  PLT 315 278 271   Cardiac Enzymes:   Recent Labs  Lab 02/09/18 1015 02/13/18 1818  CKTOTAL  --  27*  TROPONINI 1.09*  --    BNP (last 3 results) Recent Labs    02/10/18 0453  BNP 647.3*    ProBNP (last 3 results) No results for input(s): PROBNP in the last 8760 hours.  CBG: Recent Labs  Lab 02/15/18 0727 02/15/18 1147 02/15/18 1649 02/15/18 2125 02/16/18 0808  GLUCAP 149* 212* 160* 145* 166*    Recent Results (from the past 240 hour(s))  MRSA PCR Screening     Status: None   Collection Time: 02/09/18  5:30 AM  Result Value Ref Range Status   MRSA by PCR NEGATIVE NEGATIVE Final    Comment:        The GeneXpert  MRSA Assay (FDA approved for NASAL specimens only), is one component of a comprehensive MRSA colonization surveillance program. It is not intended to diagnose MRSA infection nor to guide or monitor treatment for MRSA infections. Performed at [facility], [address], [place], [place] [address]      Studies: No results found.  Scheduled Meds: . allopurinol  300 mg Oral QPM  . apixaban  5 mg Oral BID  . aspirin EC  81 mg Oral Daily  . atorvastatin  20 mg Oral q1800  . gemfibrozil  600 mg Oral BID AC  . insulin aspart  0-9 Units Subcutaneous TID WC  . ipratropium-albuterol  3 mL Nebulization TID  . liver oil-zinc oxide   Topical q morning - 10a  . methylPREDNISolone (SOLU-MEDROL) injection  80 mg Intravenous Q12H  . metoprolol succinate  25 mg Oral Daily  . nystatin  5 mL Oral QID  . potassium chloride  20 mEq Oral Daily  . senna-docusate  1 tablet Oral BID  . sodium chloride flush  3 mL Intravenous Q12H  . sodium phosphate  1 enema Rectal Once  . tamsulosin  0.4 mg Oral QPC supper    Continuous Infusions: . sodium chloride    .  magnesium sulfate 1 - 4 g bolus IVPB       Time spent: , case discussed with cardiology. I have personally reviewed and interpreted on  02/16/2018 daily labs, tele strips, imagings as discussed above under date review session and assessment and plans.  I reviewed all nursing notes, pharmacy notes, consultant notes,  vitals, pertinent old records  I have discussed plan of care as described above with RN , patient  on 02/16/2018   Albertine Grates MD, PhD  [facility] Pager [phone]. If 7PM-7AM, please contact night-coverage at [email or web address], password [facility] 02/16/2018, 8:13 AM  LOS: 8 days

## 2018-02-16 NOTE — Progress Notes (Signed)
HFNC liter flow increased to 15 L due to SpO2 of 85% on 10 L after getting back to the bed. No distress noted BS diminished. RR 14 other vital signs stable. NRB mask at bedside if needed.  HHN treatment given as scheduled.

## 2018-02-16 NOTE — Progress Notes (Signed)
RT instructed pt and family on the use of flutter valve. Pt able to demonstrate back good technique. 

## 2018-02-16 NOTE — Progress Notes (Signed)
Lacrilube ointment given Left Eye for c/o burning and itching. Eye red and tearing. No other needs expressed at this time.

## 2018-02-16 NOTE — Progress Notes (Signed)
Pt had 10 bts wide QRS with rate = 150. Otherwise Afib controlled in 90's. Pt asymptomatic.

## 2018-02-17 ENCOUNTER — Other Ambulatory Visit (HOSPITAL_COMMUNITY): Payer: Medicare Other

## 2018-02-17 DIAGNOSIS — L8990 Pressure ulcer of unspecified site, unspecified stage: Secondary | ICD-10-CM

## 2018-02-17 LAB — CBC WITH DIFFERENTIAL/PLATELET
Basophils Absolute: 0 10*3/uL (ref 0.0–0.1)
Basophils Relative: 0 %
Eosinophils Absolute: 0 10*3/uL (ref 0.0–0.7)
Eosinophils Relative: 0 %
HEMATOCRIT: 48 % (ref 39.0–52.0)
HEMOGLOBIN: 16.2 g/dL (ref 13.0–17.0)
LYMPHS ABS: 0.4 10*3/uL — AB (ref 0.7–4.0)
LYMPHS PCT: 3 %
MCH: 34.4 pg — ABNORMAL HIGH (ref 26.0–34.0)
MCHC: 33.8 g/dL (ref 30.0–36.0)
MCV: 101.9 fL — ABNORMAL HIGH (ref 78.0–100.0)
MONO ABS: 0.5 10*3/uL (ref 0.1–1.0)
MONOS PCT: 4 %
NEUTROS ABS: 11.7 10*3/uL — AB (ref 1.7–7.7)
NEUTROS PCT: 93 %
Platelets: 252 10*3/uL (ref 150–400)
RBC: 4.71 MIL/uL (ref 4.22–5.81)
RDW: 13.5 % (ref 11.5–15.5)
WBC: 12.6 10*3/uL — ABNORMAL HIGH (ref 4.0–10.5)

## 2018-02-17 LAB — BASIC METABOLIC PANEL
ANION GAP: 10 (ref 5–15)
BUN: 54 mg/dL — ABNORMAL HIGH (ref 6–20)
CO2: 29 mmol/L (ref 22–32)
Calcium: 9.5 mg/dL (ref 8.9–10.3)
Chloride: 98 mmol/L — ABNORMAL LOW (ref 101–111)
Creatinine, Ser: 1.43 mg/dL — ABNORMAL HIGH (ref 0.61–1.24)
GFR calc Af Amer: 52 mL/min — ABNORMAL LOW (ref >60)
GFR calc non Af Amer: 45 mL/min — ABNORMAL LOW (ref >60)
GLUCOSE: 153 mg/dL — AB (ref 65–99)
POTASSIUM: 5 mmol/L (ref 3.5–5.1)
Sodium: 137 mmol/L (ref 135–145)

## 2018-02-17 LAB — GLUCOSE, CAPILLARY
GLUCOSE-CAPILLARY: 171 mg/dL — AB (ref 65–99)
Glucose-Capillary: 175 mg/dL — ABNORMAL HIGH (ref 65–99)
Glucose-Capillary: 170 mg/dL — ABNORMAL HIGH (ref 65–99)
Glucose-Capillary: 125 mg/dL — ABNORMAL HIGH (ref 65–99)

## 2018-02-17 MED ORDER — LEVALBUTEROL HCL 1.25 MG/0.5ML IN NEBU
1.2500 mg | INHALATION_SOLUTION | Freq: Three times a day (TID) | RESPIRATORY_TRACT | Status: DC
  Administered 2018-02-17 – 2018-02-20 (×7): 1.25 mg via RESPIRATORY_TRACT
  Filled 2018-02-17 (×7): qty 0.5

## 2018-02-17 MED ORDER — METHYLPREDNISOLONE SODIUM SUCC 125 MG IJ SOLR
60.0000 mg | Freq: Two times a day (BID) | INTRAMUSCULAR | Status: DC
  Administered 2018-02-17 – 2018-02-20 (×7): 60 mg via INTRAVENOUS
  Filled 2018-02-17 (×8): qty 2

## 2018-02-17 MED ORDER — IPRATROPIUM BROMIDE 0.02 % IN SOLN
0.5000 mg | Freq: Three times a day (TID) | RESPIRATORY_TRACT | Status: DC
  Administered 2018-02-17 – 2018-02-20 (×7): 0.5 mg via RESPIRATORY_TRACT
  Filled 2018-02-17 (×7): qty 2.5

## 2018-02-17 NOTE — Progress Notes (Signed)
Physical Therapy Treatment Patient Details Name: Craig [name] MRN: [id number] DOB: 09-01-1935 Today's Date: 02/17/2018    History of Present Illness Alexiz Sustaita is an 83yo white male who comes to [facility] on 4/27 from [facility] after SOB adn syncopal episode at home. PT with acute hypoxic respiratory failure and on hi flow nasal cannula. Pt went to floor while weighing AM of 5/7. Pt was admitted at [facility] just two weeks ago found to have NSTEMI. PMH: PAF on eliquis, CAD c recent NSTEMI, CKD3, Lt THA, multiple lumbar spine surgeries. Pt reports "3-4" falls in the last seevral months wherein he describes a LOB and then "wakes up" on the ground unaware of what happened.     PT Comments    Pt with limited mobility due to weakness, low BP, and low SpO2. Do not feel pt can return home alone and that he will need ST-SNF. Pt agreeable. SpO2 decr to 82% on 10L with bed to chair transfer. BP's as follows.  Orthostatic BPs  Supine 117/69  Sitting 93/61  Standing 90/56     Follow Up Recommendations  SNF     Equipment Recommendations  None recommended by PT    Recommendations for Other Services       Precautions / Restrictions Precautions Precautions: Fall Restrictions Weight Bearing Restrictions: No    Mobility  Bed Mobility Overal bed mobility: Modified Independent Bed Mobility: Supine to Sit     Supine to sit: Modified independent (Device/Increase time)     General bed mobility comments: Incr time and effort  Transfers Overall transfer level: Needs assistance Equipment used: Rolling walker (2 wheeled) Transfers: Pharmacologist;Sit to/from Stand Sit to Stand: Min assist Stand pivot transfers: Min assist       General transfer comment: assist for balance and to bring hips up. Pt on 10 L of O2 with SpO2 dropping to 82% with transfer. Recovered to 90% after 2 minutes  Ambulation/Gait Ambulation/Gait assistance: Min assist(unable to trial this date d/t soft BP  ) Ambulation Distance (Feet): 5 Feet Assistive device: Rolling walker (2 wheeled) Gait Pattern/deviations: Step-through pattern;Decreased stride length Gait velocity: decreased Gait velocity interpretation: <1.31 ft/sec, indicative of household ambulator General Gait Details: Assist for balance and support   Stairs             Wheelchair Mobility    Modified Rankin (Stroke Patients Only)       Balance Overall balance assessment: Needs assistance;History of Falls Sitting-balance support: Feet supported;No upper extremity supported Sitting balance-Leahy Scale: Good     Standing balance support: During functional activity;Bilateral upper extremity supported Standing balance-Leahy Scale: Poor Standing balance comment: walker and min assist for static standing                            Cognition Arousal/Alertness: Awake/alert Behavior During Therapy: WFL for tasks assessed/performed Overall Cognitive Status: Within Functional Limits for tasks assessed                                        Exercises      General Comments        Pertinent Vitals/Pain Pain Assessment: No/denies pain    Home Living                      Prior Function  PT Goals (current goals can now be found in the care plan section) Progress towards PT goals: Progressing toward goals    Frequency    Min 2X/week      PT Plan Discharge plan needs to be updated;Frequency needs to be updated    Co-evaluation              AM-PAC PT "6 Clicks" Daily Activity  Outcome Measure  Difficulty turning over in bed (including adjusting bedclothes, sheets and blankets)?: A Little Difficulty moving from lying on back to sitting on the side of the bed? : A Little Difficulty sitting down on and standing up from a chair with arms (e.g., wheelchair, bedside commode, etc,.)?: Unable Help needed moving to and from a bed to chair (including a  wheelchair)?: A Little Help needed walking in hospital room?: A Little Help needed climbing 3-5 steps with a railing? : Total 6 Click Score: 14    End of Session Equipment Utilized During Treatment: Gait belt;Oxygen Activity Tolerance: Treatment limited secondary to medical complications (Comment)(decr SpO2 and decr BP) Patient left: in chair;with call bell/phone within reach;with chair alarm set Nurse Communication: Mobility status;Other (comment)(BP and O2) PT Visit Diagnosis: History of falling (Z91.81);Dizziness and giddiness (R42);Difficulty in walking, not elsewhere classified (R26.2);Unsteadiness on feet (R26.81)     Time: 1020-1040 PT Time Calculation (min) (ACUTE ONLY): 20 min  Charges:  $Gait Training: 8-22 mins                    G Codes:       [facility] PT [phone]    Angelina Ok [facility] 02/17/2018, 11:00 AM

## 2018-02-17 NOTE — Progress Notes (Signed)
Called into room at 0645 due to assisted fall onto floor by assigned CNA. Reports assisting getting patient out of bed for daily weight using standing scale; that pt stood up and his left knee gave out and, Lowered to the floor, No injury sustained. On call person Bodenheiner, NP notified via page sent. No new order received. Patient asked and chose to call Niece Amy Liberace @ [phone]. Made aware of the event. Adric Wrede Ruthe Mannan, BSN, RN

## 2018-02-17 NOTE — NC FL2 (Signed)
[place] MEDICAID FL2 LEVEL OF CARE SCREENING TOOL     IDENTIFICATION  Patient Name: Craig [name] Birthdate: [birth year] Sex: male Admission Date (Current Location): 02/08/2018  [facility] and [place] Number:  Producer, television/film/video and Address:  [facility] [place], [address] [place], [place] [address]      Provider Number: [id number]  Attending Physician Name and Address:  Albertine Grates, MD  Relative Name and Phone Number:  Hulan Fray, niece, [phone]    Current Level of Care: Hospital Recommended Level of Care: Skilled Nursing Facility Prior Approval Number:    Date Approved/Denied:   PASRR Number:    Discharge Plan: SNF    Current Diagnoses: Patient Active Problem List   Diagnosis Date Noted  . ILD (interstitial lung disease) (HCC)   . Acute respiratory failure with hypoxia (HCC) 02/08/2018  . Hyperglycemia 02/08/2018  . Acute pulmonary edema (HCC) 02/08/2018  . CKD (chronic kidney disease), stage III (HCC) 02/08/2018  . Atrial fibrillation with slow ventricular response (HCC) 02/04/2018  . Dyslipidemia 02/04/2018  . Non-STEMI (non-ST elevated myocardial infarction) (HCC) 01/30/2018  . Permanent atrial fibrillation (HCC) 11/08/2010  . HYPERLIPIDEMIA 11/07/2009  . Essential hypertension, benign 11/07/2009  . Coronary atherosclerosis of native coronary artery 11/07/2009    Orientation RESPIRATION BLADDER Height & Weight     Self, Time, Situation, Place  O2(HFNC 10L) Continent, External catheter Weight: 167 lb 3.2 oz (75.8 kg) Height:   (185.4 cm)  BEHAVIORAL SYMPTOMS/MOOD NEUROLOGICAL BOWEL NUTRITION STATUS      Continent Diet(please see DC summary)  AMBULATORY STATUS COMMUNICATION OF NEEDS Skin   Extensive Assist Verbally PU Stage and Appropriate Care(PU stage II coccyx, foam dressing)                       Personal Care Assistance Level of Assistance  Bathing, Feeding, Dressing Bathing Assistance: Limited assistance Feeding  assistance: Independent Dressing Assistance: Limited assistance     Functional Limitations Info  Sight, Hearing, Speech Sight Info: Adequate Hearing Info: Impaired Speech Info: Adequate    SPECIAL CARE FACTORS FREQUENCY  PT (By licensed PT)     PT Frequency: 5x/week              Contractures Contractures Info: Not present    Additional Factors Info  Code Status, Allergies, Insulin Sliding Scale Code Status Info: Full Allergies Info: No Known Allergies   Insulin Sliding Scale Info: insulin 3x/day with meals       Current Medications (02/17/2018):  This is the current hospital active medication list Current Facility-Administered Medications  Medication Dose Route Frequency Provider Last Rate Last Dose  . 0.9 %  sodium chloride infusion  250 mL Intravenous PRN Opyd, Lavone Neri, MD      . acetaminophen (TYLENOL) tablet 650 mg  650 mg Oral Q4H PRN Opyd, Lavone Neri, MD   650 mg at 02/16/18 1239  . allopurinol (ZYLOPRIM) tablet 300 mg  300 mg Oral QPM Opyd, Lavone Neri, MD   300 mg at 02/16/18 1854  . ALPRAZolam Prudy Feeler) tablet 0.25 mg  0.25 mg Oral BID PRN Briscoe Deutscher, MD   0.25 mg at 02/14/18 2127  . apixaban (ELIQUIS) tablet 5 mg  5 mg Oral BID Opyd, Lavone Neri, MD   5 mg at 02/17/18 0857  . artificial tears (LACRILUBE) ophthalmic ointment   Both Eyes Q4H PRN Albertine Grates, MD      . aspirin EC tablet 81 mg  81 mg Oral Daily Opyd, Lavone Neri, MD   81 mg at 02/17/18 0857  . atorvastatin (LIPITOR) tablet 20 mg  20 mg Oral q1800 Opyd, Lavone Neri, MD   20 mg at 02/16/18 1853  . gemfibrozil (LOPID) tablet 600 mg  600 mg Oral BID AC Scarlett Presto, RPH   600 mg at 02/17/18 0857  . insulin aspart (novoLOG) injection 0-9 Units  0-9 Units Subcutaneous TID WC Blount, Janalyn Rouse, NP   2 Units at 02/17/18 [phone]  . ipratropium (ATROVENT) nebulizer solution 0.5 mg  0.5 mg Nebulization Q8H Albertine Grates, MD   0.5 mg at 02/17/18 0700  . levalbuterol (XOPENEX) nebulizer solution 1.25 mg  1.25 mg Nebulization  Q8H Albertine Grates, MD   1.25 mg at 02/17/18 0700  . liver oil-zinc oxide (DESITIN) 40 % ointment   Topical q morning - 10a Albertine Grates, MD      . magnesium hydroxide (MILK OF MAGNESIA) suspension 30 mL  30 mL Oral Daily PRN Murlean Hark, RN   30 mL at 02/11/18 2108  . magnesium sulfate IVPB 2 g 50 mL  2 g Intravenous Once Regalado, Belkys A, MD      . menthol-cetylpyridinium (CEPACOL) lozenge 3 mg  1 lozenge Oral PRN Katrinka Blazing, Rondell A, MD      . methylPREDNISolone sodium succinate (SOLU-MEDROL) 125 mg/2 mL injection 80 mg  80 mg Intravenous Q12H Oretha Milch, MD   80 mg at 02/17/18 0852  . metoprolol succinate (TOPROL-XL) 24 hr tablet 25 mg  25 mg Oral Daily Allred, Fayrene Fearing, MD   25 mg at 02/17/18 6578  . nystatin (MYCOSTATIN) 100000 UNIT/ML suspension 500,000 Units  5 mL Oral QID Albertine Grates, MD   500,000 Units at 02/17/18 [phone]  . ondansetron (ZOFRAN) injection 4 mg  4 mg Intravenous Q6H PRN Opyd, Lavone Neri, MD      . phenol (CHLORASEPTIC) mouth spray 1 spray  1 spray Mouth/Throat PRN Albertine Grates, MD   1 spray at 02/14/18 0313  . potassium chloride SA (K-DUR,KLOR-CON) CR tablet 20 mEq  20 mEq Oral Daily Albertine Grates, MD   20 mEq at 02/17/18 0852  . senna-docusate (Senokot-S) tablet 1 tablet  1 tablet Oral BID Albertine Grates, MD   1 tablet at 02/17/18 0857  . sodium chloride (OCEAN) 0.65 % nasal spray 1 spray  1 spray Each Nare PRN Albertine Grates, MD      . sodium chloride flush (NS) 0.9 % injection 3 mL  3 mL Intravenous Q12H Opyd, Lavone Neri, MD   3 mL at 02/16/18 2205  . sodium chloride flush (NS) 0.9 % injection 3 mL  3 mL Intravenous PRN Opyd, Lavone Neri, MD      . sodium phosphate (FLEET) 7-19 GM/118ML enema 1 enema  1 enema Rectal Once Bodenheimer, Charles A, NP      . tamsulosin (FLOMAX) capsule 0.4 mg  0.4 mg Oral QPC supper Albertine Grates, MD   0.4 mg at 02/16/18 1853     Discharge Medications: Please see discharge summary for a list of discharge medications.  Relevant Imaging Results:  Relevant Lab  Results:   Additional Information SSN: [id number]  Abigail Butts, LCSW

## 2018-02-17 NOTE — Progress Notes (Signed)
PROGRESS NOTE  TRENTON PASSOW ZOX:[id number] DOB: 1934/12/06 DOA: 02/08/2018 PCP: Ignatius Specking, MD  PCP: Ignatius Specking, MD          Brief Narrative by " Dr Antionette Char": [name] a 83 y.o.malewith medical history significant forpermanent atrial fibrillation on Eliquis, coronary artery disease with recentNSTEMImedically managed,and chronic kidney disease stage III, now presenting with shortness of breath and a syncopal episode overnight. Patient had experienced chest pain approximately 2 weeks ago, eventually went into the ED 3 days later after pain had resolved, was found to have had NSTEMIwith troponin reaching 11, underwent catheterization on 01/31/2018, noted to have RCA occlusion, and medical management was recommended. He was discharged home on 02/04/2018, saw his cardiologist in clinic yesterday, seemed to be doing fairly well at that time, but suffered a syncopal episode early this morning when he got up to use the bathroom, and has been experiencing shortness of breath since that time. He denies any chest pain. Denies fevers, chills, or cough. Reports new onset of bilateral ankle edema.  [place]:Upon arrival to the ED, patient is found to beafebrile, saturating in the 80s on room air, and vitals otherwise stable. EKG features atrial fibrillation with T wave inversions. Chemistry panel is notable for glucose of 224 and CBC features macrocytosis without anemia. BNP was elevated to just over thousand and and troponin was elevated to 0.68. A second troponin was obtained and elevated further to 0.81. The patient was treated with 40 mg IV Lasix in the ED and supplemental oxygen. Transfer to [facility] was arranged for admission to the stepdown unit for ongoing evaluation and management of acute pulmonary edema with hypoxic respiratory failure.   Admitted with acute hypoxic respiratory failure, recent NSTEMI, respiratory failure thought to be related to  Heart failure exacerbation. Question of lung diseases.     HPI/Recap of past 24 hours:  Has assisted fall while getting daily weight this am  Still has DOE with minimal activity, he reports intermittent dry cough occasionally, I did not notice any cough during my encounter.   He reports sore throat is much better, no fever, denies chest pain, no edema        Assessment/Plan: Principal Problem:   Acute pulmonary edema (HCC) Active Problems:   Coronary atherosclerosis of native coronary artery   Permanent atrial fibrillation (HCC)   Non-STEMI (non-ST elevated myocardial infarction) (HCC)   Acute respiratory failure with hypoxia (HCC)   Hyperglycemia   CKD (chronic kidney disease), stage III (HCC)   ILD (interstitial lung disease) (HCC)  Acute hypoxic respiratory failure -Unclear etiology, pulmonary edema versus pneumonitis -He is improving slowly on  IV Lasix , high-dose steroid, nebs , on HFNC -lasix discontinued on 5/3 due to significant orthostatic hypotension, elevated bun/cr -has tachycardia, changed duoneb to xopenex/atrovent. -Cardiology and pulmonology following, will follow recommendations  Leukocytosis:  From steroids? hemoconcentration from lasix? No fever,  monitor  N-STEMI;  -he was hospitalized  From 4/18 to 4/23 to cardiology service for NSTEMI - has cardiac cath on 4/19 with Known occlusion of RCA on cath 4/19 with medical mgmt recommended -denies chest pain, Limited ECHO EF 50 % -Cardiology following.    A fib;  Remain in afib on metoprolol and eliquis.  Heart rate seems better .  Oral thrush: topical nystatin solution, improving  Hypokalemia/hypomagnesemia: resolved. monitor  CKD stage III  Monitor renal function on lasix.   BPH: d/c cardura, due to orthostatic hypotension, change to flomax  Gout: stable on allopurinol  FTT, reports lives by himself, had progressive weakness in the last few weeks,  PT eval, he is very weak and  deconditioned currently,    Code Status: full  Family Communication: patient   Disposition Plan: not ready to discharge, need cardiology and pulmonology clearance Patient agreed to snf at discharge  Consultants:  cardiology  Pulmonology/critical care  Procedures:  none  Antibiotics:  none   Objective: BP 136/67 (BP Location: Left Arm)   Pulse 88   Temp (!) 97.5 F (36.4 C) (Oral)   Resp 19   Ht  (1.854 m)   Wt 75.8 kg (167 lb 3.2 oz)   SpO2 100%   BMI 22.06 kg/m   Intake/Output Summary (Last 24 hours) at 02/17/2018 0744 Last data filed at 02/17/2018 0616 Gross per 24 hour  Intake 720 ml  Output 250 ml  Net 470 ml   Filed Weights   02/15/18 0505 02/16/18 0447 02/17/18 0552  Weight: 78.1 kg (172 lb 2.9 oz) 77.2 kg (170 lb 3.2 oz) 75.8 kg (167 lb 3.2 oz)    Exam: Patient is examined daily including today on 02/17/2018, exams remain the same as of yesterday except that has changed    General:  NAD, weak, trush on tongue is resolving  Cardiovascular: IRRR  Respiratory: improved aeration, I did not hear crackles today, no wheezing  Abdomen: Soft/ND/NT, positive BS  Musculoskeletal: No Edema  Neuro: alert, oriented   Data Reviewed: Basic Metabolic Panel: Recent Labs  Lab 02/11/18 0504  02/13/18 0350 02/14/18 0542 02/15/18 0452 02/16/18 0407 02/17/18 0416  NA 142   < > 140 141 139 138 137  K 3.9   < > 4.6 4.3 4.2 4.6 5.0  CL 104   < > 104 99* 101 100* 98*  CO2 28   < > GLUCOSE 141*   < > 144* 162* 168* 166* 153*  BUN 46*   < > 53* 58* 58* 61* 54*  CREATININE 1.32*   < > 1.30* 1.42* 1.45* 1.46* 1.43*  CALCIUM 10.0   < > 9.9 10.0 9.8 9.9 9.5  MG 2.4  --  2.7*  --  2.7*  --   --    < > = values in this interval not displayed.   Liver Function Tests: Recent Labs  Lab 02/15/18 0452  AST 22  ALT 18  ALKPHOS 87  BILITOT 1.1  PROT 6.2*  ALBUMIN 2.7*   No results for input(s): LIPASE, AMYLASE in the last 168 hours. No  results for input(s): AMMONIA in the last 168 hours. CBC: Recent Labs  Lab 02/15/18 0452 02/16/18 0407 02/17/18 0416  WBC 11.8* 16.9* 12.6*  NEUTROABS  --   --  11.7*  HGB 16.9 17.1* 16.2  HCT 49.4 50.6 48.0  MCV 102.1* 102.4* 101.9*  PLT 278 271 252   Cardiac Enzymes:   Recent Labs  Lab 02/13/18 1818  CKTOTAL 27*   BNP (last 3 results) Recent Labs    02/10/18 0453  BNP 647.3*    ProBNP (last 3 results) No results for input(s): PROBNP in the last 8760 hours.  CBG: Recent Labs  Lab 02/15/18 2125 02/16/18 0808 02/16/18 1136 02/16/18 1751 02/16/18 2120  GLUCAP 145* 166* 167* 144* 118*    Recent Results (from the past 240 hour(s))  MRSA PCR Screening     Status: None   Collection Time: 02/09/18  5:30 AM  Result Value Ref  Range Status   MRSA by PCR NEGATIVE NEGATIVE Final    Comment:        The GeneXpert MRSA Assay (FDA approved for NASAL specimens only), is one component of a comprehensive MRSA colonization surveillance program. It is not intended to diagnose MRSA infection nor to guide or monitor treatment for MRSA infections. Performed at [facility], [address] [place], [place] [address]      Studies: No results found.  Scheduled Meds: . allopurinol  300 mg Oral QPM  . apixaban  5 mg Oral BID  . aspirin EC  81 mg Oral Daily  . atorvastatin  20 mg Oral q1800  . gemfibrozil  600 mg Oral BID AC  . insulin aspart  0-9 Units Subcutaneous TID WC  . ipratropium  0.5 mg Nebulization Q8H  . levalbuterol  1.25 mg Nebulization Q8H  . liver oil-zinc oxide   Topical q morning - 10a  . methylPREDNISolone (SOLU-MEDROL) injection  80 mg Intravenous Q12H  . metoprolol succinate  25 mg Oral Daily  . nystatin  5 mL Oral QID  . potassium chloride  20 mEq Oral Daily  . senna-docusate  1 tablet Oral BID  . sodium chloride flush  3 mL Intravenous Q12H  . sodium phosphate  1 enema Rectal Once  . tamsulosin  0.4 mg Oral QPC supper    Continuous  Infusions: . sodium chloride    . magnesium sulfate 1 - 4 g bolus IVPB       Time spent: , case discussed with pulmonology and social worker I have personally reviewed and interpreted on  02/17/2018 daily labs, tele strips, imagings as discussed above under date review session and assessment and plans.  I reviewed all nursing notes, pharmacy notes, consultant notes,  vitals, pertinent old records  I have discussed plan of care as described above with RN , patient  on 02/17/2018   Albertine Grates MD, PhD  [facility] Pager [phone]. If 7PM-7AM, please contact night-coverage at [email or web address], password [facility] 02/17/2018, 7:44 AM  LOS: 9 days

## 2018-02-17 NOTE — Progress Notes (Signed)
[place] PCCM Progress Note  83 year old remote smoker with recent NSTEMI and RCA occlusion on cath admitted 4/27 with elevated troponin and acute hypoxic respiratory failure. He was diuresed but  remained severely hypoxic.  He  transitioned off BiPAP  to high flow oxygen , he was started on high-dose steroids  4/29.  He is on 1o L high flow nasal cannula this morning.  Denies SOB, chest pain.  General: Adult male, resting in bed, in NAD. Neuro: A&O x 3, non-focal.  HEENT: /AT. EOMI, sclerae anicteric. Cardiovascular: RRR, no M/R/G.  Lungs: Respirations even and unlabored.  CTA bilaterally, No W/R/R. Abdomen: BS x 4, soft, NT/ND.  Musculoskeletal: No gross deformities, no edema.  Skin: Intact, warm, no rashes.  Imaging: Unfortunately CT chest was not high-resolution-but this did suggest some underlying interstitial lung disease. Impression-I do feel that he has underlying interstitial lung disease, which is either stabilizing or responding somewhat to steroids  Labs: Anti Jo, CCP negative ESR mildly elevated at 19  Plan: Continue Solumedrol, drop from 80 mg to '60mg'$  every 12 hours Continue to wean FiO2 Maintain negative balance Continue PT efforts ANA, ANCA ordered 5/6   Montey Hora, PA - [facility] Pager: [phone]  or [phone] 02/17/2018, 10:18 AM

## 2018-02-17 NOTE — Progress Notes (Addendum)
Progress Note  Patient Name: Craig [name] Date of Encounter: 02/17/2018  Primary Cardiologist: Nona Dell, MD   Subjective   Feeling a little better. Still on hi-flow O2. Knees buckled when he stood up earlier and he slid to the floor, no injury. Appetite has been poor (even before April acute MI) -8L since admission, but +0.7L last 24h 167 lb: weight down 15 lb since admission. Note that at previous DC on 04/23 weight was 200 lb and he was felt to be compensated. Weight in September was 200 lb also. BUN a little lower at 54, creatinine unchanged at 1.4 (was 1.7 during previous admission)  Inpatient Medications    Scheduled Meds: . allopurinol  300 mg Oral QPM  . apixaban  5 mg Oral BID  . aspirin EC  81 mg Oral Daily  . atorvastatin  20 mg Oral q1800  . gemfibrozil  600 mg Oral BID AC  . insulin aspart  0-9 Units Subcutaneous TID WC  . ipratropium  0.5 mg Nebulization Q8H  . levalbuterol  1.25 mg Nebulization Q8H  . liver oil-zinc oxide   Topical q morning - 10a  . methylPREDNISolone (SOLU-MEDROL) injection  80 mg Intravenous Q12H  . metoprolol succinate  25 mg Oral Daily  . nystatin  5 mL Oral QID  . potassium chloride  20 mEq Oral Daily  . senna-docusate  1 tablet Oral BID  . sodium chloride flush  3 mL Intravenous Q12H  . sodium phosphate  1 enema Rectal Once  . tamsulosin  0.4 mg Oral QPC supper   Continuous Infusions: . sodium chloride    . magnesium sulfate 1 - 4 g bolus IVPB     PRN Meds: sodium chloride, acetaminophen, ALPRAZolam, artificial tears, magnesium hydroxide, menthol-cetylpyridinium, ondansetron (ZOFRAN) IV, phenol, sodium chloride, sodium chloride flush   Vital Signs    Vitals:   02/17/18 0552 02/17/18 0700 02/17/18 0852 02/17/18 0904  BP: 136/67  113/64   Pulse: 88  92   Resp: 19     Temp: (!) 97.5 F (36.4 C)   98.2 F (36.8 C)  TempSrc: Oral   Oral  SpO2: (!) 89% 100%  98%  Weight: 167 lb 3.2 oz (75.8 kg)     Height:         Intake/Output Summary (Last 24 hours) at 02/17/2018 0944 Last data filed at 02/17/2018 0616 Gross per 24 hour  Intake 720 ml  Output 250 ml  Net 470 ml   Filed Weights   02/15/18 0505 02/16/18 0447 02/17/18 0552  Weight: 172 lb 2.9 oz (78.1 kg) 170 lb 3.2 oz (77.2 kg) 167 lb 3.2 oz (75.8 kg)    Telemetry    atrial fibrillation with controlled rate, mostly around 90 bpm. 2 runs of 10-beat NSVT yesterday afternoon.  - Personally Reviewed  ECG    04/28 - atrial fibrillation, extensive T wave inversion V3-V6 and inferior leads - Personally Reviewed  Physical Exam  Appears a little frail. Can speak in uninterrupted sentences GEN: No acute distress.   Neck: flat JVP Cardiac: irregular, no murmurs, rubs, or gallops.  Respiratory: Clear to auscultation bilaterally. GI: Soft, nontender, non-distended  MS: No edema; No deformity. Neuro:  Nonfocal  Psych: Normal affect   Labs    Chemistry Recent Labs  Lab 02/15/18 0452 02/16/18 0407 02/17/18 0416  NA 139 138 137  K 4.2 4.6 5.0  CL 101 100* 98*  CO2 GLUCOSE 168* 166* 153*  BUN 58* 61* 54*  CREATININE 1.45* 1.46* 1.43*  CALCIUM 9.8 9.9 9.5  PROT 6.2*  --   --   ALBUMIN 2.7*  --   --   AST 22  --   --   ALT 18  --   --   ALKPHOS 87  --   --   BILITOT 1.1  --   --   GFRNONAA 44* 44* 45*  GFRAA 51* 51* 52*  ANIONGAP Hematology Recent Labs  Lab 02/15/18 0452 02/16/18 0407 02/17/18 0416  WBC 11.8* 16.9* 12.6*  RBC 4.84 4.94 4.71  HGB 16.9 17.1* 16.2  HCT 49.4 50.6 48.0  MCV 102.1* 102.4* 101.9*  MCH 34.9* 34.6* 34.4*  MCHC 34.2 33.8 33.8  RDW 13.7 13.6 13.5  PLT 278 271 252     Radiology    No results found.  Cardiac Studies  01/31/2018 CATH LEFT HEART CATH AND CORONARY ANGIOGRAPHY  Conclusion     Mid Cx lesion is 25% stenosed.  Mid LAD lesion is 25% stenosed.  Prox LAD lesion is 25% stenosed.  Prox RCA lesion is 100% stenosed. Large dominant vessel. There are left  to right collaterals. Culprit lesion.  There is no aortic valve stenosis.  LV end diastolic pressure is mildly elevated.   Medical therapy.  RCA occlusion occurred on Monday based on symptoms.  No angina in over 24 hours.  Occlusion looks organized and there would be high risk of distal embolization attempt at intervention since the vessel is likely been occluded for 4 days.  He may be having some heart rate issues due to the occluded RCA.  Hopefully, these will resolve.    Restart heparin in 6 hours. If the patient had further anginal symptoms in the future, could consider PCI at a later time.      02/10/2018 ECHO - Left ventricle: The cavity size was normal. There was mild focal   basal hypertrophy of the septum. Systolic function was normal.   The estimated ejection fraction was in the range of 55% to 60%.   There is hypokinesis of the basalinferior myocardium. - Aortic valve: There was mild regurgitation. - Aortic root: The aortic root was mildly dilated. - Mitral valve: Calcified annulus. There was mild regurgitation. - Left atrium: The atrium was moderately dilated. - Right atrium: The atrium was moderately dilated. - Pulmonary arteries: PA peak pressure: 31 mm Hg (S).  Impressions:  - Hypokinesis of the inferobasal wall with overall preserved LV   function; mild AI; mildly dilated aortic root; mild MR; moderate   LAE; mild RAE; mild TR.   Patient Profile     83 y.o. male with recent NSTEMI, RCA occlusion treated medically, preserved LVEF, permanent atrial fibrillation, CKD 3, admitted with acute hypoxic respiratory failure (CHF exacerbation versus aspiration)  Assessment & Plan    1. Acute hypoxic respiratory failure: prominent bilateral infiltrates on CXR, new from prior admission, despite no additional volume gain. Infectious process/aspiration? Recheck limited echo to make sure he has not developed ischemic MR (although I cannt hear that on exam). 2. CHF: I can  find no physical signs of volume overload. Will recheck a BNP. Hard to believe he is still in acute HF since he left hospital last time 33 lb heavier than he is today. He will probably need daily diuretic at DC. 3. CAD: no angina. Has CTO RCA. 4. CKD 3:  Unsure of baseline (1.22 on this admission), but stable  parameters last 3 days. 5. AFib:  Fair rate control 6. NSVT: on beta blockers, monitor. 7. Deconditioning:  Continue PT. May not be able to go straight back to living alone at home.  For questions or updates, please contact [facility] Please consult [email or web address] for contact info under Cardiology/STEMI.      Signed, Thurmon Fair, MD  02/17/2018, 9:44 AM

## 2018-02-17 NOTE — Clinical Social Work Note (Signed)
Clinical Social Work Assessment  Patient Details  Name: Craig [name] MRN: [id number] Date of Birth: June 25, 1935  Date of referral:  02/17/18               Reason for consult:  Facility Placement, Discharge Planning                Permission sought to share information with:  Facility Sport and exercise psychologist, Family Supports Permission granted to share information::  Yes, Verbal Permission Granted  Name::     Craig [name]  Agency::  [facility]NF[facility]  Relationship::  sister in Financial trader Information:  [phone]  Housing/Transportation Living arrangements for the past 2 months:  Single Family Home Source of Information:  Patient, Other (Comment Required) Patient Interpreter Needed:  None Criminal Activity/Legal Involvement Pertinent to Current Situation/Hospitalization:  No - Comment as needed Significant Relationships:  Other Family Members, Siblings Lives with:  Self Do you feel safe going back to the place where you live?  Yes Need for family participation in patient care:  No (Coment)  Care giving concerns: Patient from home independently. PT recommending SNF.  Social Worker assessment / plan: CSW met with patient and sister in Sports coach, Craig [name], at bedside. Patient alert and oriented. CSW discussed plan for SNF. Family prefers SNF over home health, given patient's care needs and deconditioning. Patient had an assisted fall this morning. PT also now recommending SNF. PTA, patient was independent with ADLs.   Family agreeable to referrals to [facility]; they prefer [facility] in [place], [place]. CSW advised that given [place]'s distance from the hospital (over 50 miles), they will be required to pay out of pocket approximately $1000 for the ambulance transportation to the SNF. Patient and family agreeable to pay for the ambulance if they can get a bed at [facility].  CSW faxed out initial referrals. CSW also left voicemail for [facility] admissions requesting call back regarding  their bed availability.  Family not agreeable to other facilities in [place], nor facilities in [place]. They may consider [facility] facilities if no bed is available at [facility]. If plan does change to [facility] facility, patient will need to be screened for PASRR.   CSW awaiting bed offers and will follow up to support with discharge.  Employment status:  Retired Forensic scientist:  Medicare PT Recommendations:  [place] / Referral to community resources:  [place]  Patient/Family's Response to care: Patient and family appreciative of care.  Patient/Family's Understanding of and Emotional Response to Diagnosis, Current Treatment, and Prognosis: Patient and family with good understanding of patient's conditions and agreeable to SNF.  Emotional Assessment Appearance:  Appears stated age Attitude/Demeanor/Rapport:  Engaged Affect (typically observed):  Appropriate, Calm, Pleasant Orientation:  Oriented to Self, Oriented to Place, Oriented to  Time, Oriented to Situation Alcohol / Substance use:  Not Applicable Psych involvement (Current and /or in the community):  No (Comment)  Discharge Needs  Concerns to be addressed:  Discharge Planning Concerns, Care Coordination Readmission within the last 30 days:  No Current discharge risk:  Physical Impairment, Lives alone Barriers to Discharge:  Continued Medical Work up   Craig Emms, LCSW 02/17/2018, 4:15 PM

## 2018-02-18 ENCOUNTER — Inpatient Hospital Stay (HOSPITAL_COMMUNITY): Payer: Medicare Other

## 2018-02-18 DIAGNOSIS — I361 Nonrheumatic tricuspid (valve) insufficiency: Secondary | ICD-10-CM

## 2018-02-18 LAB — GLUCOSE, CAPILLARY
GLUCOSE-CAPILLARY: 108 mg/dL — AB (ref 65–99)
GLUCOSE-CAPILLARY: 127 mg/dL — AB (ref 65–99)
Glucose-Capillary: 144 mg/dL — ABNORMAL HIGH (ref 65–99)
Glucose-Capillary: 134 mg/dL — ABNORMAL HIGH (ref 65–99)

## 2018-02-18 LAB — ECHOCARDIOGRAM LIMITED
Height: 73 in
Weight: 2780.8 oz

## 2018-02-18 LAB — BASIC METABOLIC PANEL
Anion gap: 9 (ref 5–15)
BUN: 53 mg/dL — ABNORMAL HIGH (ref 6–20)
CO2: 29 mmol/L (ref 22–32)
Calcium: 9.6 mg/dL (ref 8.9–10.3)
Chloride: 100 mmol/L — ABNORMAL LOW (ref 101–111)
Creatinine, Ser: 1.4 mg/dL — ABNORMAL HIGH (ref 0.61–1.24)
GFR calc Af Amer: 53 mL/min — ABNORMAL LOW (ref >60)
GFR calc non Af Amer: 46 mL/min — ABNORMAL LOW (ref >60)
Glucose, Bld: 139 mg/dL — ABNORMAL HIGH (ref 65–99)
Potassium: 5.1 mmol/L (ref 3.5–5.1)
Sodium: 138 mmol/L (ref 135–145)

## 2018-02-18 LAB — RHEUMATOID FACTOR: Rhuematoid fact SerPl-aCnc: <10 IU/mL (ref 0.0–13.9)

## 2018-02-18 LAB — BRAIN NATRIURETIC PEPTIDE: B Natriuretic Peptide: 425.5 pg/mL — ABNORMAL HIGH (ref 0.0–100.0)

## 2018-02-18 LAB — ANCA TITERS
Atypical P-ANCA titer: <1:20 {titer}
C-ANCA: <1:20 {titer}
P-ANCA: <1:20 {titer}

## 2018-02-18 MED ORDER — FUROSEMIDE 40 MG PO TABS
40.0000 mg | ORAL_TABLET | Freq: Every day | ORAL | Status: DC
Start: 2018-02-18 — End: 2018-02-26
  Administered 2018-02-18 – 2018-02-26 (×9): 40 mg via ORAL
  Filled 2018-02-18 (×9): qty 1

## 2018-02-18 MED ORDER — PERFLUTREN LIPID MICROSPHERE
1.0000 mL | INTRAVENOUS | Status: AC | PRN
  Administered 2018-02-18: 4 mL via INTRAVENOUS
  Filled 2018-02-18: qty 10

## 2018-02-18 NOTE — Progress Notes (Signed)
PROGRESS NOTE  Craig [name] ZOX:[id number] DOB: [birth year] DOA: 02/08/2018 PCP: Ignatius Specking, MD  PCP: Ignatius Specking, MD          Brief Narrative by " Dr Antionette Char": Craig [name] a 83 y.o.malewith medical history significant forpermanent atrial fibrillation on Eliquis, coronary artery disease with recentNSTEMImedically managed,and chronic kidney disease stage III, now presenting with shortness of breath and a syncopal episode overnight. Patient had experienced chest pain approximately 2 weeks ago, eventually went into the ED 3 days later after pain had resolved, was found to have had NSTEMIwith troponin reaching 11, underwent catheterization on 01/31/2018, noted to have RCA occlusion, and medical management was recommended. He was discharged home on 02/04/2018, saw his cardiologist in clinic yesterday, seemed to be doing fairly well at that time, but suffered a syncopal episode early this morning when he got up to use the bathroom, and has been experiencing shortness of breath since that time. He denies any chest pain. Denies fevers, chills, or cough. Reports new onset of bilateral ankle edema.  [place]:Upon arrival to the ED, patient is found to beafebrile, saturating in the 80s on room air, and vitals otherwise stable. EKG features atrial fibrillation with T wave inversions. Chemistry panel is notable for glucose of 224 and CBC features macrocytosis without anemia. BNP was elevated to just over thousand and and troponin was elevated to 0.68. A second troponin was obtained and elevated further to 0.81. The patient was treated with 40 mg IV Lasix in the ED and supplemental oxygen. Transfer to [facility] was arranged for admission to the stepdown unit for ongoing evaluation and management of acute pulmonary edema with hypoxic respiratory failure.   Admitted with acute hypoxic respiratory failure, recent NSTEMI, respiratory failure thought to be related to  Heart failure exacerbation. Question of lung diseases.     HPI/Recap of past 24 hours:  Uneventful night, no fever, denies chest pain, no edema  Remain on HFNC but able to wean down to 7liter this am      Assessment/Plan: Principal Problem:   Acute pulmonary edema (HCC) Active Problems:   Coronary atherosclerosis of native coronary artery   Permanent atrial fibrillation (HCC)   Non-STEMI (non-ST elevated myocardial infarction) (HCC)   Acute respiratory failure with hypoxia (HCC)   Hyperglycemia   CKD (chronic kidney disease), stage III (HCC)   ILD (interstitial lung disease) (HCC)   Pressure injury of skin  Acute hypoxic respiratory failure -Unclear etiology, pulmonary edema versus pneumonitis -lasix discontinued on 5/3 due to significant orthostatic hypotension, elevated bun/cr - high-dose steroid, nebs , wean  HFNC -Cardiology and pulmonology following, input appreciated  Leukocytosis:  From steroids? hemoconcentration from lasix? No fever,  improving monitor  N-STEMI;  -he was hospitalized  From 4/18 to 4/23 to cardiology service for NSTEMI - has cardiac cath on 4/19 with Known occlusion of RCA on cath 4/19 with medical mgmt recommended -denies chest pain, Limited ECHO EF 50 % -Cardiology following.    A fib;  Remain in afib on metoprolol and eliquis.  Heart rate seems better .  Oral thrush: topical nystatin solution, improving  Hypokalemia/hypomagnesemia: resolved. monitor  CKD stage III  Monitor renal function on lasix.   BPH: d/c cardura, due to orthostatic hypotension, change to flomax  Gout: stable on allopurinol  FTT, reports lives by himself, had progressive weakness in the last few weeks,  PT eval, he is very weak and deconditioned currently,    Code Status: full  Family Communication: patient   Disposition Plan: not ready to discharge, need cardiology and pulmonology clearance, need to wean HFNC Patient agreed to snf at  discharge  Consultants:  cardiology  Pulmonology/critical care  Procedures:  none  Antibiotics:  none   Objective: BP 112/67 (BP Location: Right Arm)   Pulse 83   Temp (!) 97.5 F (36.4 C) (Oral)   Resp (!) 24   Ht  (1.854 m)   Wt 78.8 kg (173 lb 12.8 oz)   SpO2 97%   BMI 22.93 kg/m   Intake/Output Summary (Last 24 hours) at 02/18/2018 1232 Last data filed at 02/18/2018 1219 Gross per 24 hour  Intake 1266 ml  Output 1225 ml  Net 41 ml   Filed Weights   02/16/18 0447 02/17/18 0552 02/18/18 0538  Weight: 77.2 kg (170 lb 3.2 oz) 75.8 kg (167 lb 3.2 oz) 78.8 kg (173 lb 12.8 oz)    Exam: Patient is examined daily including today on 02/18/2018, exams remain the same as of yesterday except that has changed    General:  NAD, weak, trush on tongue is resolving  Cardiovascular: IRRR  Respiratory: improved aeration, I did not hear crackles today, no wheezing  Abdomen: Soft/ND/NT, positive BS  Musculoskeletal: No Edema  Neuro: alert, oriented   Data Reviewed: Basic Metabolic Panel: Recent Labs  Lab 02/13/18 0350 02/14/18 0542 02/15/18 0452 02/16/18 0407 02/17/18 0416 02/18/18 0441  NA 140 141 139 138 137 138  K 4.6 4.3 4.2 4.6 5.0 5.1  CL 104 99* 101 100* 98* 100*  CO2 GLUCOSE 144* 162* 168* 166* 153* 139*  BUN 53* 58* 58* 61* 54* 53*  CREATININE 1.30* 1.42* 1.45* 1.46* 1.43* 1.40*  CALCIUM 9.9 10.0 9.8 9.9 9.5 9.6  MG 2.7*  --  2.7*  --   --   --    Liver Function Tests: Recent Labs  Lab 02/15/18 0452  AST 22  ALT 18  ALKPHOS 87  BILITOT 1.1  PROT 6.2*  ALBUMIN 2.7*   No results for input(s): LIPASE, AMYLASE in the last 168 hours. No results for input(s): AMMONIA in the last 168 hours. CBC: Recent Labs  Lab 02/15/18 0452 02/16/18 0407 02/17/18 0416  WBC 11.8* 16.9* 12.6*  NEUTROABS  --   --  11.7*  HGB 16.9 17.1* 16.2  HCT 49.4 50.6 48.0  MCV 102.1* 102.4* 101.9*  PLT 278 271 252   Cardiac Enzymes:   Recent  Labs  Lab 02/13/18 1818  CKTOTAL 27*   BNP (last 3 results) Recent Labs    02/10/18 0453 02/18/18 0441  BNP 647.3* 425.5*    ProBNP (last 3 results) No results for input(s): PROBNP in the last 8760 hours.  CBG: Recent Labs  Lab 02/17/18 1119 02/17/18 1615 02/17/18 2104 02/18/18 0754 02/18/18 1200  GLUCAP 170* 171* 125* 108* 127*    Recent Results (from the past 240 hour(s))  MRSA PCR Screening     Status: None   Collection Time: 02/09/18  5:30 AM  Result Value Ref Range Status   MRSA by PCR NEGATIVE NEGATIVE Final    Comment:        The GeneXpert MRSA Assay (FDA approved for NASAL specimens only), is one component of a comprehensive MRSA colonization surveillance program. It is not intended to diagnose MRSA infection nor to guide or monitor treatment for MRSA infections. Performed at [facility], [address] [place], [place] [address]  Studies: No results found.  Scheduled Meds: . allopurinol  300 mg Oral QPM  . apixaban  5 mg Oral BID  . aspirin EC  81 mg Oral Daily  . atorvastatin  20 mg Oral q1800  . furosemide  40 mg Oral Daily  . gemfibrozil  600 mg Oral BID AC  . insulin aspart  0-9 Units Subcutaneous TID WC  . ipratropium  0.5 mg Nebulization TID  . levalbuterol  1.25 mg Nebulization TID  . liver oil-zinc oxide   Topical q morning - 10a  . methylPREDNISolone (SOLU-MEDROL) injection  60 mg Intravenous Q12H  . metoprolol succinate  25 mg Oral Daily  . nystatin  5 mL Oral QID  . senna-docusate  1 tablet Oral BID  . sodium chloride flush  3 mL Intravenous Q12H  . tamsulosin  0.4 mg Oral QPC supper    Continuous Infusions: . sodium chloride    . magnesium sulfate 1 - 4 g bolus IVPB       Time spent: ,  I have personally reviewed and interpreted on  02/18/2018 daily labs, tele strips, imagings as discussed above under date review session and assessment and plans.  I reviewed all nursing notes, pharmacy notes, consultant  notes,  vitals, pertinent old records  I have discussed plan of care as described above with RN , patient  on 02/18/2018   Albertine Grates MD, PhD  [facility] Pager [phone]. If 7PM-7AM, please contact night-coverage at [email or web address], password [facility] 02/18/2018, 12:32 PM  LOS: 10 days

## 2018-02-18 NOTE — Progress Notes (Signed)
Progress Note  Patient Name: Craig [name] Date of Encounter: 02/18/2018  Primary Cardiologist: Nona Dell, MD   Subjective   Net neutral fluid balance, but weight up 3 lb (weight has has been 170 +/- 3 lb for the last week). Repeat BNP 425, lower than on admission (baseline not defined). Stable BUN/creat.  Inpatient Medications    Scheduled Meds: . allopurinol  300 mg Oral QPM  . apixaban  5 mg Oral BID  . aspirin EC  81 mg Oral Daily  . atorvastatin  20 mg Oral q1800  . gemfibrozil  600 mg Oral BID AC  . insulin aspart  0-9 Units Subcutaneous TID WC  . ipratropium  0.5 mg Nebulization TID  . levalbuterol  1.25 mg Nebulization TID  . liver oil-zinc oxide   Topical q morning - 10a  . methylPREDNISolone (SOLU-MEDROL) injection  60 mg Intravenous Q12H  . metoprolol succinate  25 mg Oral Daily  . nystatin  5 mL Oral QID  . senna-docusate  1 tablet Oral BID  . sodium chloride flush  3 mL Intravenous Q12H  . tamsulosin  0.4 mg Oral QPC supper   Continuous Infusions: . sodium chloride    . magnesium sulfate 1 - 4 g bolus IVPB     PRN Meds: sodium chloride, acetaminophen, ALPRAZolam, artificial tears, magnesium hydroxide, menthol-cetylpyridinium, ondansetron (ZOFRAN) IV, perflutren lipid microspheres (DEFINITY) IV suspension, phenol, sodium chloride, sodium chloride flush   Vital Signs    Vitals:   02/17/18 2101 02/18/18 0014 02/18/18 0538 02/18/18 0806  BP: 113/77 116/80 120/71 119/68  Pulse: 79 70 76 82  Resp: 20 (!) 21 (!) 21   Temp: 97.7 F (36.5 C) (!) 97.5 F (36.4 C) (!) 97.4 F (36.3 C)   TempSrc: Oral Oral Oral   SpO2: 90% 92% 99%   Weight:   173 lb 12.8 oz (78.8 kg)   Height:        Intake/Output Summary (Last 24 hours) at 02/18/2018 0955 Last data filed at 02/18/2018 0500 Gross per 24 hour  Intake 600 ml  Output 1225 ml  Net -625 ml   Filed Weights   02/16/18 0447 02/17/18 0552 02/18/18 0538  Weight: 170 lb 3.2 oz (77.2 kg) 167 lb 3.2 oz (75.8  kg) 173 lb 12.8 oz (78.8 kg)    Telemetry    Afib, controlled rate; occ PVCs, a single 4-beat run of NSVT - Personally Reviewed  ECG    No new - Personally Reviewed  Physical Exam  Looks a little more optimistic, smiled a couple of times GEN: No acute distress.   Neck: No JVD Cardiac: irregular, no murmurs, rubs, or gallops.  Respiratory: Crackles in bases GI: Soft, nontender, non-distended  MS: No edema; No deformity. Neuro:  Nonfocal  Psych: Normal affect   Labs    Chemistry Recent Labs  Lab 02/15/18 0452 02/16/18 0407 02/17/18 0416 02/18/18 0441  NA 139 138 137 138  K 4.2 4.6 5.0 5.1  CL 101 100* 98* 100*  CO2 GLUCOSE 168* 166* 153* 139*  BUN 58* 61* 54* 53*  CREATININE 1.45* 1.46* 1.43* 1.40*  CALCIUM 9.8 9.9 9.5 9.6  PROT 6.2*  --   --   --   ALBUMIN 2.7*  --   --   --   AST 22  --   --   --   ALT 18  --   --   --   ALKPHOS 87  --   --   --  BILITOT 1.1  --   --   --   GFRNONAA 44* 44* 45* 46*  GFRAA 51* 51* 52* 53*  ANIONGAP Hematology Recent Labs  Lab 02/15/18 0452 02/16/18 0407 02/17/18 0416  WBC 11.8* 16.9* 12.6*  RBC 4.84 4.94 4.71  HGB 16.9 17.1* 16.2  HCT 49.4 50.6 48.0  MCV 102.1* 102.4* 101.9*  MCH 34.9* 34.6* 34.4*  MCHC 34.2 33.8 33.8  RDW 13.7 13.6 13.5  PLT 278 271 252    Cardiac EnzymesNo results for input(s): TROPONINI in the last 168 hours. No results for input(s): TROPIPOC in the last 168 hours.   BNP Recent Labs  Lab 02/18/18 0441  BNP 425.5*     DDimer No results for input(s): DDIMER in the last 168 hours.   Radiology    No results found.  Cardiac Studies   01/31/2018 CATH LEFT HEART CATH AND CORONARY ANGIOGRAPHY  Conclusion     Mid Cx lesion is 25% stenosed.  Mid LAD lesion is 25% stenosed.  Prox LAD lesion is 25% stenosed.  Prox RCA lesion is 100% stenosed. Large dominant vessel. There are left to right collaterals. Culprit lesion.  There is no aortic valve  stenosis.  LV end diastolic pressure is mildly elevated.  Medical therapy. RCA occlusion occurred on Monday based on symptoms. No angina in over 24 hours. Occlusion looks organized and there would be high risk of distal embolization attempt at intervention since the vessel is likely been occluded for 4 days. He may be having some heart rate issues due to the occluded RCA. Hopefully, these will resolve.   Restart heparin in 6 hours. If the patient had further anginal symptoms in the future, could consider PCI at a later time.     02/10/2018 ECHO - Left ventricle: The cavity size was normal. There was mild focal basal hypertrophy of the septum. Systolic function was normal. The estimated ejection fraction was in the range of 55% to 60%. There is hypokinesis of the basalinferior myocardium. - Aortic valve: There was mild regurgitation. - Aortic root: The aortic root was mildly dilated. - Mitral valve: Calcified annulus. There was mild regurgitation. - Left atrium: The atrium was moderately dilated. - Right atrium: The atrium was moderately dilated. - Pulmonary arteries: PA peak pressure: 31 mm Hg (S).  Impressions:  - Hypokinesis of the inferobasal wall with overall preserved LV function; mild AI; mildly dilated aortic root; mild MR; moderate LAE; mild RAE; mild TR.     Patient Profile     83 y.o. male with recent NSTEMI, RCA occlusion treated medically, preserved LVEF, permanent atrial fibrillation, CKD 3, admitted with acute hypoxic respiratory failure (CHF exacerbation versus aspiration)  Assessment & Plan    1. Acute hypoxic respiratory failure: prominent bilateral infiltrates on CXR, new from prior admission, despite no additional volume gain. No significant MR on echo. Infectious process/aspiration versus interstitial lung disease?  2. CHF: I can find no physical signs of volume overload. BNP is lower. I do not believe his residual hypoxia is due to  heart failure. Will restart maintenance diuretic.  His echo looks like apical variant hypertrophic cardiomyopathy with a small area of apical entrapment and previous infarction. It also shows inferior and inferoseptal hypokinesis, consistent with known RCA occlusion. 3. CAD: no angina. Has CTO RCA. 4. CKD 3:   stable parameters last 4-5 days. 5. AFib:  excellent rate control 6. NSVT: possibly related to apical variant HCM; on beta  blockers, episodes are brief and asymptomatic; monitor. 7. Deconditioning:  Continue PT. He would like to go to SNF in [place]..    For questions or updates, please contact [facility] Please consult [email or web address] for contact info under Cardiology/STEMI.      Signed, Thurmon Fair, MD  02/18/2018, 9:55 AM

## 2018-02-18 NOTE — Progress Notes (Signed)
Pt had 4 beats run of V-tach followed by 2 regular beats, then 2 beats run, then 2 regular beats, then 2 beats of V-tach. Pt was having a bowel movement and denied chest discomfort. Dr. Roda Shutters made aware.  Hinton Dyer, RN
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
## 2018-02-18 NOTE — Progress Notes (Signed)
[facility] PCCM Progress Note  83 year old remote smoker with recent NSTEMI and RCA occlusion on cath admitted 4/27 with elevated troponin and acute hypoxic respiratory failure. He was diuresed but  remained severely hypoxic.  He  transitioned off BiPAP  to high flow oxygen , he was started on high-dose steroids  4/29.  He is on 10 L high flow nasal cannula this morning.  Denies SOB, chest pain.  Actually states that he feels somewhat better.  General: Adult male, resting in bed watching TV, in NAD. Neuro: A&O x 3, non-focal.  HEENT: [place]/AT. EOMI, sclerae anicteric. Cardiovascular: RRR, no M/R/G.  Lungs: Respirations even and unlabored.  CTA bilaterally, No W/R/R. Abdomen: BS x 4, soft, NT/ND.  Musculoskeletal: No gross deformities, no edema.  Skin: Intact, warm, no rashes.  Imaging: Unfortunately CT chest was not high-resolution-but this did suggest some underlying interstitial lung disease. Impression-I do feel that he has underlying interstitial lung disease, which is either stabilizing or responding somewhat to steroids  Labs: Anti Jo, CCP, RF negative ESR mildly elevated at 19 ANCA and ANA pending  Plan: Continue Solumedrol, 47m every 12 hours and continue to wean down Continue to wean FiO2, hopefully can get him off of HFNC Maintain negative balance, appears to be there already as agree no evidence of overload on exam Continue PT efforts ANA, ANCA ordered 5/6   RMontey Hora PA - [facility] Pager: [phone] or ([phone]/04/2018, 11:39 AM

## 2018-02-18 NOTE — Progress Notes (Signed)
This note also relates to the following rows which could not be included: SpO2 - Cannot attach notes to unvalidated device data  Turned pt down to 7L high flow.  Sats stable at 98

## 2018-02-18 NOTE — Progress Notes (Signed)
  Echocardiogram 2D Echocardiogram has been performed.  Delcie Roch 02/18/2018, 9:15 AM

## 2018-02-18 NOTE — Care Management Important Message (Signed)
Important Message  Patient Details  Name: Craig [name] MRN: [id number] Date of Birth: 1935/09/24   Medicare Important Message Given:  Yes    Sheray Grist P Helina Hullum 02/18/2018, 12:53 PM

## 2018-02-19 DIAGNOSIS — R29898 Other symptoms and signs involving the musculoskeletal system: Secondary | ICD-10-CM

## 2018-02-19 DIAGNOSIS — I422 Other hypertrophic cardiomyopathy: Secondary | ICD-10-CM

## 2018-02-19 LAB — BASIC METABOLIC PANEL
Anion gap: 9 (ref 5–15)
BUN: 50 mg/dL — AB (ref 6–20)
CO2: 29 mmol/L (ref 22–32)
CREATININE: 1.43 mg/dL — AB (ref 0.61–1.24)
Calcium: 9.4 mg/dL (ref 8.9–10.3)
Chloride: 100 mmol/L — ABNORMAL LOW (ref 101–111)
GFR calc non Af Amer: 45 mL/min — ABNORMAL LOW (ref >60)
GFR, EST AFRICAN AMERICAN: 52 mL/min — AB (ref >60)
Glucose, Bld: 151 mg/dL — ABNORMAL HIGH (ref 65–99)
POTASSIUM: 4.5 mmol/L (ref 3.5–5.1)
SODIUM: 138 mmol/L (ref 135–145)

## 2018-02-19 LAB — CBC
HCT: 48.3 % (ref 39.0–52.0)
Hemoglobin: 16.3 g/dL (ref 13.0–17.0)
MCH: 34.2 pg — ABNORMAL HIGH (ref 26.0–34.0)
MCHC: 33.7 g/dL (ref 30.0–36.0)
MCV: 101.3 fL — ABNORMAL HIGH (ref 78.0–100.0)
Platelets: 265 10*3/uL (ref 150–400)
RBC: 4.77 MIL/uL (ref 4.22–5.81)
RDW: 13.7 % (ref 11.5–15.5)
WBC: 15.7 10*3/uL — AB (ref 4.0–10.5)

## 2018-02-19 LAB — GLUCOSE, CAPILLARY
GLUCOSE-CAPILLARY: 149 mg/dL — AB (ref 65–99)
GLUCOSE-CAPILLARY: 185 mg/dL — AB (ref 65–99)
GLUCOSE-CAPILLARY: 125 mg/dL — AB (ref 65–99)
Glucose-Capillary: 138 mg/dL — ABNORMAL HIGH (ref 65–99)

## 2018-02-19 LAB — MPO/PR-3 (ANCA) ANTIBODIES
ANCA Proteinase 3: <3.5 U/mL (ref 0.0–3.5)
Myeloperoxidase Abs: <9 U/mL (ref 0.0–9.0)

## 2018-02-19 LAB — ANTINUCLEAR ANTIBODIES, IFA: ANTINUCLEAR ANTIBODIES, IFA: NEGATIVE

## 2018-02-19 MED ORDER — SODIUM CHLORIDE 0.9 % IV SOLN
1.0000 g | INTRAVENOUS | Status: DC
  Administered 2018-02-19 – 2018-02-21 (×3): 1 g via INTRAVENOUS
  Filled 2018-02-19 (×3): qty 10

## 2018-02-19 MED ORDER — NITROGLYCERIN 2 % TD OINT
1.0000 [in_us] | TOPICAL_OINTMENT | Freq: Four times a day (QID) | TRANSDERMAL | Status: AC
  Filled 2018-02-19: qty 30

## 2018-02-19 MED ORDER — GUAIFENESIN ER 600 MG PO TB12
600.0000 mg | ORAL_TABLET | Freq: Two times a day (BID) | ORAL | Status: DC
  Administered 2018-02-19 – 2018-02-26 (×15): 600 mg via ORAL
  Filled 2018-02-19 (×15): qty 1

## 2018-02-19 NOTE — Progress Notes (Signed)
CSW spoke to admissions at [facility] in [place], patient's preferred facility. Patient lives in [place] near this facility. [place] can offer a bed for patient once weaned off HFNC - they can accept patient at 6L regular nasal cannula max.   Patient's other SNF offer in [place] also can only accept patient on 6L regular nasal cannula as well.  Patient would need to be screened for PASRR if going to [facility] facility, however, patient and family declining [place] facilities.   CSW to follow for disposition planning pending medical readiness.  Abigail Butts, LCSWA [phone]

## 2018-02-19 NOTE — Progress Notes (Signed)
1915 Bedside shift report, pt resting in bed, NAD, lines, tubes checked and verified. Fall precautions in place, [facility].   2000 Pt assessed, see flow sheet.   2145 Pt medicated per MAR, new IV placed without difficulty. Pt denies pain, SOB. Fall precautions in place, [facility].   2320 Pt resting comfortably, NAD, O2 sats in mid 90s on 4L. WCTM.

## 2018-02-19 NOTE — Progress Notes (Addendum)
Progress Note  Patient Name: Craig [name] Date of Encounter: 02/19/2018  Primary Cardiologist: Nona Dell, MD   Subjective   Reports his breathing is unchanged. Denies any new complaints. Denies chest pain or palpitations.   Inpatient Medications    Scheduled Meds: . allopurinol  300 mg Oral QPM  . apixaban  5 mg Oral BID  . aspirin EC  81 mg Oral Daily  . atorvastatin  20 mg Oral q1800  . furosemide  40 mg Oral Daily  . gemfibrozil  600 mg Oral BID AC  . guaiFENesin  600 mg Oral BID  . insulin aspart  0-9 Units Subcutaneous TID WC  . ipratropium  0.5 mg Nebulization TID  . levalbuterol  1.25 mg Nebulization TID  . liver oil-zinc oxide   Topical q morning - 10a  . methylPREDNISolone (SOLU-MEDROL) injection  60 mg Intravenous Q12H  . metoprolol succinate  25 mg Oral Daily  . nystatin  5 mL Oral QID  . senna-docusate  1 tablet Oral BID  . sodium chloride flush  3 mL Intravenous Q12H  . tamsulosin  0.4 mg Oral QPC supper   Continuous Infusions: . sodium chloride    . magnesium sulfate 1 - 4 g bolus IVPB     PRN Meds: sodium chloride, acetaminophen, ALPRAZolam, artificial tears, magnesium hydroxide, menthol-cetylpyridinium, ondansetron (ZOFRAN) IV, phenol, sodium chloride, sodium chloride flush   Vital Signs    Vitals:   02/19/18 0100 02/19/18 0327 02/19/18 0802 02/19/18 0958  BP:  136/88 119/77   Pulse: 94 90 82   Resp: Temp:  97.7 F (36.5 C) 97.8 F (36.6 C)   TempSrc:  Axillary Axillary   SpO2: 94% 94% 95% 95%  Weight:  168 lb 6.4 oz (76.4 kg)    Height:        Intake/Output Summary (Last 24 hours) at 02/19/2018 1006 Last data filed at 02/19/2018 0825 Gross per 24 hour  Intake 468 ml  Output 950 ml  Net -482 ml   Filed Weights   02/17/18 0552 02/18/18 0538 02/19/18 0327  Weight: 167 lb 3.2 oz (75.8 kg) 173 lb 12.8 oz (78.8 kg) 168 lb 6.4 oz (76.4 kg)    Telemetry    Atrial fibrillation with good rate control. Brief episode of NSVT (4  beats) overnight. Occasional PVCs - Personally Reviewed  Physical Exam   GEN: Thin elderly gentleman laying in bed in no acute distress.   Neck: No JVD, no carotid bruits Cardiac: RRR, no murmurs, rubs, or gallops.  Respiratory: Crackles at lung bases GI: NABS, Soft, nontender, non-distended  MS: No edema; No deformity. Neuro:  Nonfocal, moving all extremities spontaneously Psych: Normal affect   Labs    Chemistry Recent Labs  Lab 02/15/18 0452  02/17/18 0416 02/18/18 0441 02/19/18 0523  NA 139   < > 137 138 138  K 4.2   < > 5.0 5.1 4.5  CL 101   < > 98* 100* 100*  CO2 26   < > GLUCOSE 168*   < > 153* 139* 151*  BUN 58*   < > 54* 53* 50*  CREATININE 1.45*   < > 1.43* 1.40* 1.43*  CALCIUM 9.8   < > 9.5 9.6 9.4  PROT 6.2*  --   --   --   --   ALBUMIN 2.7*  --   --   --   --   AST 22  --   --   --   --  ALT 18  --   --   --   --   ALKPHOS 87  --   --   --   --   BILITOT 1.1  --   --   --   --   GFRNONAA 44*   < > 45* 46* 45*  GFRAA 51*   < > 52* 53* 52*  ANIONGAP 12   < > < > = values in this interval not displayed.     Hematology Recent Labs  Lab 02/16/18 0407 02/17/18 0416 02/19/18 0523  WBC 16.9* 12.6* 15.7*  RBC 4.94 4.71 4.77  HGB 17.1* 16.2 16.3  HCT 50.6 48.0 48.3  MCV 102.4* 101.9* 101.3*  MCH 34.6* 34.4* 34.2*  MCHC 33.8 33.8 33.7  RDW 13.6 13.5 13.7  PLT 271 252 265    Cardiac EnzymesNo results for input(s): TROPONINI in the last 168 hours. No results for input(s): TROPIPOC in the last 168 hours.   BNP Recent Labs  Lab 02/18/18 0441  BNP 425.5*     DDimer No results for input(s): DDIMER in the last 168 hours.   Radiology    No results found.  Cardiac Studies   Echocardiogram 02/18/18: Study Conclusions  - Left ventricle: The cavity size was normal. There was moderate   asymmetric hypertrophy of the apex. There is a small dyskinetic   area of apical entrapment. The appearance was consistent with   apical  variant hypertrophic cardiomyopathy. Systolic function was   normal. The estimated ejection fraction was in the range of 60%   to 65%. Mild hypokinesis of the basalinferior and inferoseptal   myocardium; consistent with infarction in the distribution of the   right coronary artery; unchanged from the previous study.   Acoustic contrast opacification revealed no evidence ofthrombus. - Aortic valve: There was mild to moderate regurgitation directed   centrally in the LVOT. - Mitral valve: There was mild regurgitation. - Left atrium: The atrium was moderately dilated.  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 male with PMH of CAD s/p recent NSTEMI 01/31/18 (RCA occlusion medically managed), permanent atrial fibrillation, CKD stage III, who presented with syncope and found to have acute hypoxic respiratory failure (CHF exacerbation vs aspiration).   Assessment & Plan    1. Acute hypoxic respiratory failure: bilateral infiltrates noted on CXR (new from prior admission) without over edema. BNP improved from admission but baseline unknown.  Patient was briefly diuresed with IV lasix, however became orthostatic and Cr bumped; currently on po. Remained severely hypoxic requiring high flow O2. ?ILD - pulmonary signed off yesterday; ANA and ANCA pending. On IV steroids - Continue management per primary team  2. Acute diastolic CHF: overall appears euvolemic at this time. Neutral fluid balance over the last 24 hours; net -8.3L this admission. BNP improved. Weight down to 168lbs today. Echo yesterday c/w apical variant hypertrophic cardiomyopathy, as well as inferior/inferoseptal hypokinesis c/w known RCA occlusion - Continue po lasix and metoprolol  3. CAD s/p NSTEMI: LHC 01/31/18 with RCA occlusion, medically managed. Without anginal complaints - Continue ASA and statin  4. CKD stage 3: Cr stable  at 1.43 today after restarting po lasix yesterday - Continue to monitor closely  5. Permanent atrial fibrillation: rate well controlled - Continue apixaban and metoprolol  6. NSVT: possibly related to apical variant HCM. One 4 beat episode  noted in the last 24 hours. Patient is asymptomatic - Continue metoprolol - Continue to monitor on telemetry  7. Deconditioning: patient would like to go to SNF in [place]    For questions or updates, please contact [facility] Please consult [email or web address] for contact info under Cardiology/STEMI.      Signed, Beatriz Stallion, PA-C  02/19/2018, 10:06 AM   [phone]  I have seen and examined the patient along with Beatriz Stallion, PA-C .  I have reviewed the chart, notes and new data.  I agree with PA's note.  Key new complaints: Feels better.  Breathing easier. Key examination changes: Still requiring 6 L oxygen by nasal cannula.  No edema, no JVD, no gallop.  Basilar crackles bilaterally.  Irregular rhythm well rate controlled. Key new findings / data: Weight stable at 167-170 pounds on oral diuretics . Echo consistent with apical variant hypertrophic cardiomyopathy with a small area of apical aneurysm/infarction, which is likely the source of his episodes of nonsustained VT.  In addition there is continued evidence of inferior wall motion of normality consistent with known chronic total occlusion of the right coronary artery.  Overall LVEF is preserved.  PLAN: He is euvolemic, but continues to have hypoxia and is severely deconditioned. Planning rehabilitation in [place]. No change in cardiac medications.  Thurmon Fair, MD, [facility] [phone] 02/19/2018, 3:40 PM

## 2018-02-19 NOTE — Progress Notes (Signed)
PROGRESS NOTE  Craig [name]:[id number] DOB: 1935-02-14 DOA: 02/08/2018 PCP: Ignatius Specking, MD  PCP: Ignatius Specking, MD          Brief Narrative by " Dr Antionette Char": Craig [name] D Hinchis a 83 y.o.malewith medical history significant forpermanent atrial fibrillation on Eliquis, coronary artery disease with recentNSTEMImedically managed,and chronic kidney disease stage III, now presenting with shortness of breath and a syncopal episode overnight. Patient had experienced chest pain approximately 2 weeks ago, eventually went into the ED 3 days later after pain had resolved, was found to have had NSTEMIwith troponin reaching 11, underwent catheterization on 01/31/2018, noted to have RCA occlusion, and medical management was recommended. He was discharged home on 02/04/2018, saw his cardiologist in clinic yesterday, seemed to be doing fairly well at that time, but suffered a syncopal episode early this morning when he got up to use the bathroom, and has been experiencing shortness of breath since that time. He denies any chest pain. Denies fevers, chills, or cough. Reports new onset of bilateral ankle edema.  [place]:Upon arrival to the ED, patient is found to beafebrile, saturating in the 80s on room air, and vitals otherwise stable. EKG features atrial fibrillation with T wave inversions. Chemistry panel is notable for glucose of 224 and CBC features macrocytosis without anemia. BNP was elevated to just over thousand and and troponin was elevated to 0.68. A second troponin was obtained and elevated further to 0.81. The patient was treated with 40 mg IV Lasix in the ED and supplemental oxygen. Transfer to [facility] was arranged for admission to the stepdown unit for ongoing evaluation and management of acute pulmonary edema with hypoxic respiratory failure.   Admitted with acute hypoxic respiratory failure, recent NSTEMI, respiratory failure thought to be related to  Heart failure exacerbation. Question of lung diseases.     HPI/Recap of past 24 hours:  Uneventful night, no fever, denies chest pain, no edema  Remain on HFNC but able to wean down to 7liter this am      Assessment/Plan: Principal Problem:   Acute pulmonary edema (HCC) Active Problems:   Coronary atherosclerosis of native coronary artery   Permanent atrial fibrillation (HCC)   Non-STEMI (non-ST elevated myocardial infarction) (HCC)   Acute respiratory failure with hypoxia (HCC)   Hyperglycemia   CKD (chronic kidney disease), stage III (HCC)   ILD (interstitial lung disease) (HCC)   Pressure injury of skin  Acute hypoxic respiratory failure -Unclear etiology, pulmonary edema versus pneumonitis -lasix discontinued on 5/3 due to significant orthostatic hypotension, elevated bun/cr - high-dose steroid, nebs , wean  HFNC -Cardiology and pulmonology following, input appreciated - pt making slow progress, will try abx - will consider re-imaging and req bronch from pulm - flutter valve, IS, mucinex bid - no O2 as OP, will try to wean - nitropaste trial - CXR in AM  Leukocytosis:  From steroids? hemoconcentration from lasix? No fever,  improving monitor  N-STEMI;  -he was hospitalized  From 4/18 to 4/23 to cardiology service for NSTEMI - has cardiac cath on 4/19 with Known occlusion of RCA on cath 4/19 with medical mgmt recommended -denies chest pain, Limited ECHO EF 50 % -Cardiology following.  - no CP   A fib;  Remain in afib on metoprolol and eliquis.  Heart rate seems better .  Oral thrush: topical nystatin solution, improved  Hypokalemia/hypomagnesemia: resolved. monitor  CKD stage III  Monitor renal function on lasix. Doing well  BPH: d/c  cardura, due to orthostatic hypotension, change to flomax No issues  Gout: stable on allopurinol  FTT, reports lives by himself, had progressive weakness in the last few weeks,  PT eval, he is very weak and  deconditioned currently,    Code Status: full  Family Communication: patient   Disposition Plan: not ready to discharge, need cardiology and pulmonology clearance, need to wean HFNC Patient agreed to snf at discharge  Consultants:  cardiology  Pulmonology/critical care  Procedures:  none  Antibiotics:  none  S: no complaints. Still SOB. Can only ambulate a short distance.  Objective: BP 119/77 (BP Location: Left Arm)   Pulse 83   Temp 97.8 F (36.6 C) (Axillary)   Resp 18   Ht  (1.854 m)   Wt 76.4 kg (168 lb 6.4 oz)   SpO2 96%   BMI 22.22 kg/m   Intake/Output Summary (Last 24 hours) at 02/19/2018 1234 Last data filed at 02/19/2018 0825 Gross per 24 hour  Intake 246 ml  Output 950 ml  Net -704 ml   Filed Weights   02/17/18 0552 02/18/18 0538 02/19/18 0327  Weight: 75.8 kg (167 lb 3.2 oz) 78.8 kg (173 lb 12.8 oz) 76.4 kg (168 lb 6.4 oz)    Exam: Patient is examined daily including today on 02/19/2018, exams remain the same as of yesterday except that has changed    General:  NAD, weak, trush on tongue is resolving  Cardiovascular: IRRR  Respiratory: gooed aeration, no wheeze, crackles in bases  Abdomen: Soft/ND/NT, positive BS  Musculoskeletal: No Edema  Neuro: alert, oriented   Data Reviewed: Basic Metabolic Panel: Recent Labs  Lab 02/13/18 0350  02/15/18 0452 02/16/18 0407 02/17/18 0416 02/18/18 0441 02/19/18 0523  NA 140   < > 139 138 137 138 138  K 4.6   < > 4.2 4.6 5.0 5.1 4.5  CL 104   < > 101 100* 98* 100* 100*  CO2 26   < > GLUCOSE 144*   < > 168* 166* 153* 139* 151*  BUN 53*   < > 58* 61* 54* 53* 50*  CREATININE 1.30*   < > 1.45* 1.46* 1.43* 1.40* 1.43*  CALCIUM 9.9   < > 9.8 9.9 9.5 9.6 9.4  MG 2.7*  --  2.7*  --   --   --   --    < > = values in this interval not displayed.   Liver Function Tests: Recent Labs  Lab 02/15/18 0452  AST 22  ALT 18  ALKPHOS 87  BILITOT 1.1  PROT 6.2*  ALBUMIN 2.7*    No results for input(s): LIPASE, AMYLASE in the last 168 hours. No results for input(s): AMMONIA in the last 168 hours. CBC: Recent Labs  Lab 02/15/18 0452 02/16/18 0407 02/17/18 0416 02/19/18 0523  WBC 11.8* 16.9* 12.6* 15.7*  NEUTROABS  --   --  11.7*  --   HGB 16.9 17.1* 16.2 16.3  HCT 49.4 50.6 48.0 48.3  MCV 102.1* 102.4* 101.9* 101.3*  PLT 278 271 252 265   Cardiac Enzymes:   Recent Labs  Lab 02/13/18 1818  CKTOTAL 27*   BNP (last 3 results) Recent Labs    02/10/18 0453 02/18/18 0441  BNP 647.3* 425.5*    ProBNP (last 3 results) No results for input(s): PROBNP in the last 8760 hours.  CBG: Recent Labs  Lab 02/18/18 1200 02/18/18 1648 02/18/18 2107 02/19/18 0754 02/19/18 1100  GLUCAP  127* 144* 134* 138* 149*    No results found for this or any previous visit (from the past 240 hour(s)).   Studies: No results found.  Scheduled Meds: . allopurinol  300 mg Oral QPM  . apixaban  5 mg Oral BID  . aspirin EC  81 mg Oral Daily  . atorvastatin  20 mg Oral q1800  . furosemide  40 mg Oral Daily  . gemfibrozil  600 mg Oral BID AC  . guaiFENesin  600 mg Oral BID  . insulin aspart  0-9 Units Subcutaneous TID WC  . ipratropium  0.5 mg Nebulization TID  . levalbuterol  1.25 mg Nebulization TID  . liver oil-zinc oxide   Topical q morning - 10a  . methylPREDNISolone (SOLU-MEDROL) injection  60 mg Intravenous Q12H  . metoprolol succinate  25 mg Oral Daily  . nystatin  5 mL Oral QID  . senna-docusate  1 tablet Oral BID  . sodium chloride flush  3 mL Intravenous Q12H  . tamsulosin  0.4 mg Oral QPC supper    Continuous Infusions: . sodium chloride    . cefTRIAXone (ROCEPHIN)  IV    . magnesium sulfate 1 - 4 g bolus IVPB       Time spent: 35  Haydee Salter MD, PhD  [facility] Pager [phone]. If 7PM-7AM, please contact night-coverage at [email or web address], password [facility] 02/19/2018, 12:34 PM  LOS: 11 days

## 2018-02-20 ENCOUNTER — Inpatient Hospital Stay (HOSPITAL_COMMUNITY): Payer: Medicare Other

## 2018-02-20 LAB — CBC WITH DIFFERENTIAL/PLATELET
BASOS ABS: 0 10*3/uL (ref 0.0–0.1)
BASOS PCT: 0 %
Eosinophils Absolute: 0 10*3/uL (ref 0.0–0.7)
Eosinophils Relative: 0 %
HEMATOCRIT: 48.9 % (ref 39.0–52.0)
HEMOGLOBIN: 16.7 g/dL (ref 13.0–17.0)
Lymphocytes Relative: 7 %
Lymphs Abs: 1.1 10*3/uL (ref 0.7–4.0)
MCH: 34.2 pg — ABNORMAL HIGH (ref 26.0–34.0)
MCHC: 34.2 g/dL (ref 30.0–36.0)
MCV: 100.2 fL — ABNORMAL HIGH (ref 78.0–100.0)
MONOS PCT: 1 %
Monocytes Absolute: 0.2 10*3/uL (ref 0.1–1.0)
NEUTROS ABS: 15.5 10*3/uL — AB (ref 1.7–7.7)
NEUTROS PCT: 92 %
Platelets: 241 10*3/uL (ref 150–400)
RBC: 4.88 MIL/uL (ref 4.22–5.81)
RDW: 13.6 % (ref 11.5–15.5)
WBC: 16.8 10*3/uL — AB (ref 4.0–10.5)

## 2018-02-20 LAB — GLUCOSE, CAPILLARY
GLUCOSE-CAPILLARY: 130 mg/dL — AB (ref 65–99)
GLUCOSE-CAPILLARY: 169 mg/dL — AB (ref 65–99)
GLUCOSE-CAPILLARY: 156 mg/dL — AB (ref 65–99)
Glucose-Capillary: 106 mg/dL — ABNORMAL HIGH (ref 65–99)

## 2018-02-20 LAB — BASIC METABOLIC PANEL
Anion gap: 10 (ref 5–15)
BUN: 46 mg/dL — ABNORMAL HIGH (ref 6–20)
CALCIUM: 9.2 mg/dL (ref 8.9–10.3)
CO2: 26 mmol/L (ref 22–32)
CREATININE: 1.44 mg/dL — AB (ref 0.61–1.24)
Chloride: 101 mmol/L (ref 101–111)
GFR, EST AFRICAN AMERICAN: 51 mL/min — AB (ref >60)
GFR, EST NON AFRICAN AMERICAN: 44 mL/min — AB (ref >60)
Glucose, Bld: 142 mg/dL — ABNORMAL HIGH (ref 65–99)
Potassium: 4.3 mmol/L (ref 3.5–5.1)
SODIUM: 137 mmol/L (ref 135–145)

## 2018-02-20 MED ORDER — LEVALBUTEROL HCL 1.25 MG/0.5ML IN NEBU
1.2500 mg | INHALATION_SOLUTION | Freq: Two times a day (BID) | RESPIRATORY_TRACT | Status: DC
  Administered 2018-02-20 – 2018-02-21 (×2): 1.25 mg via RESPIRATORY_TRACT
  Filled 2018-02-20: qty 0.5

## 2018-02-20 MED ORDER — IPRATROPIUM BROMIDE 0.02 % IN SOLN
0.5000 mg | Freq: Two times a day (BID) | RESPIRATORY_TRACT | Status: DC
  Administered 2018-02-20 – 2018-02-21 (×3): 0.5 mg via RESPIRATORY_TRACT
  Filled 2018-02-20 (×3): qty 2.5

## 2018-02-20 NOTE — Progress Notes (Signed)
0415 Pt up to standing scale, assisted back in bed. Pt SOB, O2 sats dropped to 80s. O2 increased to 4L [place], O2 sats in low 90s, WCTM.   0630 Pt sleeping comfortably, O2 decreased to 2L. Awaiting day shift RN for report. Fall precautions in place, [facility].

## 2018-02-20 NOTE — Progress Notes (Signed)
PROGRESS NOTE  Craig MARET ONG:[id number] DOB: [birth year] DOA: 02/08/2018 PCP: Ignatius Specking, MD  PCP: Ignatius Specking, MD          Brief Narrative by " Dr Antionette Char": Craig [name] a 83 y.o.malewith medical history significant forpermanent atrial fibrillation on Eliquis, coronary artery disease with recentNSTEMImedically managed,and chronic kidney disease stage III, now presenting with shortness of breath and a syncopal episode overnight. Patient had experienced chest pain approximately 2 weeks ago, eventually went into the ED 3 days later after pain had resolved, was found to have had NSTEMIwith troponin reaching 11, underwent catheterization on 01/31/2018, noted to have RCA occlusion, and medical management was recommended. He was discharged home on 02/04/2018, saw his cardiologist in clinic yesterday, seemed to be doing fairly well at that time, but suffered a syncopal episode early this morning when he got up to use the bathroom, and has been experiencing shortness of breath since that time. He denies any chest pain. Denies fevers, chills, or cough. Reports new onset of bilateral ankle edema.  [place]:Upon arrival to the ED, patient is found to beafebrile, saturating in the 80s on room air, and vitals otherwise stable. EKG features atrial fibrillation with T wave inversions. Chemistry panel is notable for glucose of 224 and CBC features macrocytosis without anemia. BNP was elevated to just over thousand and and troponin was elevated to 0.68. A second troponin was obtained and elevated further to 0.81. The patient was treated with 40 mg IV Lasix in the ED and supplemental oxygen. Transfer to [facility] was arranged for admission to the stepdown unit for ongoing evaluation and management of acute pulmonary edema with hypoxic respiratory failure.   Admitted with acute hypoxic respiratory failure, recent NSTEMI, respiratory failure thought to be related to  Heart failure exacerbation. Question of lung diseases.     HPI/Recap of past 24 hours:  Uneventful night, no fever, denies chest pain, no edema  Remain on HFNC but able to wean down to 4 liter this am      Assessment/Plan: Principal Problem:   Acute pulmonary edema (HCC) Active Problems:   Coronary atherosclerosis of native coronary artery   Permanent atrial fibrillation (HCC)   Non-STEMI (non-ST elevated myocardial infarction) (HCC)   Respiratory failure (HCC)   Hyperglycemia   CKD (chronic kidney disease), stage III (HCC)   ILD (interstitial lung disease) (HCC)   Pressure injury of skin   Muscular deconditioning   Apical variant hypertrophic cardiomyopathy (HCC)  Acute hypoxic respiratory failure -Unclear etiology, pulmonary edema versus pneumonitis -lasix discontinued on 5/3 due to significant orthostatic hypotension, elevated bun/cr - high-dose steroid, nebs , wean  HFNC -Cardiology and pulmonology following, input appreciated - pt making slow progress, will try abx - will consider re-imaging and req bronch from pulm - flutter valve, IS, mucinex bid - no O2 as OP, will try to wean - CXR this AM shows no big change - walk test tomorrow  Leukocytosis:  From steroids? hemoconcentration from lasix? No fever,  improving monitor  N-STEMI;  -he was hospitalized  From 4/18 to 4/23 to cardiology service for NSTEMI - has cardiac cath on 4/19 with Known occlusion of RCA on cath 4/19 with medical mgmt recommended -denies chest pain, Limited ECHO EF 50 % -Cardiology following.  - no CP   A fib;  Remain in afib on metoprolol and eliquis.  Heart rate seems better .  Oral thrush: topical nystatin solution, improved  Hypokalemia/hypomagnesemia: resolved. monitor  CKD stage III  Monitor renal function on lasix. Doing well  BPH: d/c cardura, due to orthostatic hypotension, change to flomax No issues  Gout: stable on allopurinol  FTT, reports lives by  himself, had progressive weakness in the last few weeks,  PT eval, he is very weak and deconditioned currently,    Code Status: full  Family Communication: patient   Disposition Plan: not ready to discharge, need cardiology and pulmonology clearance, need to wean HFNC Patient agreed to snf at discharge  Consultants:  cardiology  Pulmonology/critical care  Procedures:  none  Antibiotics:  none  S: no complaints. SOB better.  Objective: BP 110/79 (BP Location: Left Arm)   Pulse 92   Temp 97.8 F (36.6 C) (Oral)   Resp 19   Ht  (1.854 m)   Wt 76 kg (167 lb 9.6 oz)   SpO2 94%   BMI 22.11 kg/m   Intake/Output Summary (Last 24 hours) at 02/20/2018 1830 Last data filed at 02/20/2018 1807 Gross per 24 hour  Intake 1118 ml  Output 2325 ml  Net -1207 ml   Filed Weights   02/18/18 0538 02/19/18 0327 02/20/18 0432  Weight: 78.8 kg (173 lb 12.8 oz) 76.4 kg (168 lb 6.4 oz) 76 kg (167 lb 9.6 oz)    Exam: Patient is examined daily including today on 02/20/2018, exams remain the same as of yesterday except that has changed    General:  NAD, weak, trush on tongue is resolving  Cardiovascular: IRRR  Respiratory: gooed aeration, no wheeze, crackles in bases  Abdomen: Soft/ND/NT, positive BS  Musculoskeletal: No Edema  Neuro: alert, oriented   Data Reviewed: Basic Metabolic Panel: Recent Labs  Lab 02/15/18 0452 02/16/18 0407 02/17/18 0416 02/18/18 0441 02/19/18 0523 02/20/18 0459  NA 139 138 137 138 138 137  K 4.2 4.6 5.0 5.1 4.5 4.3  CL 101 100* 98* 100* 100* 101  CO2 GLUCOSE 168* 166* 153* 139* 151* 142*  BUN 58* 61* 54* 53* 50* 46*  CREATININE 1.45* 1.46* 1.43* 1.40* 1.43* 1.44*  CALCIUM 9.8 9.9 9.5 9.6 9.4 9.2  MG 2.7*  --   --   --   --   --    Liver Function Tests: Recent Labs  Lab 02/15/18 0452  AST 22  ALT 18  ALKPHOS 87  BILITOT 1.1  PROT 6.2*  ALBUMIN 2.7*   No results for input(s): LIPASE, AMYLASE in the last 168  hours. No results for input(s): AMMONIA in the last 168 hours. CBC: Recent Labs  Lab 02/15/18 0452 02/16/18 0407 02/17/18 0416 02/19/18 0523 02/20/18 0459  WBC 11.8* 16.9* 12.6* 15.7* 16.8*  NEUTROABS  --   --  11.7*  --  15.5*  HGB 16.9 17.1* 16.2 16.3 16.7  HCT 49.4 50.6 48.0 48.3 48.9  MCV 102.1* 102.4* 101.9* 101.3* 100.2*  PLT 278 271 252 265 241   Cardiac Enzymes:   No results for input(s): CKTOTAL, CKMB, CKMBINDEX, TROPONINI in the last 168 hours. BNP (last 3 results) Recent Labs    02/10/18 0453 02/18/18 0441  BNP 647.3* 425.5*    ProBNP (last 3 results) No results for input(s): PROBNP in the last 8760 hours.  CBG: Recent Labs  Lab 02/19/18 1658 02/19/18 2143 02/20/18 0758 02/20/18 1201 02/20/18 1545  GLUCAP 185* 125* 130* 106* 169*    No results found for this or any previous visit (from the past 240 hour(s)).   Studies: Dg Chest  2 View  Result Date: 02/20/2018 CLINICAL DATA:  Shortness of breath, atrial fibrillation, coronary artery disease EXAM: CHEST - 2 VIEW COMPARISON:  02/13/2018 FINDINGS: Upper normal heart size. Atherosclerotic calcification aorta. Mediastinal contours normal. Asymmetric pulmonary infiltrates RIGHT greater than LEFT question pneumonia versus asymmetric edema. No definite pleural effusion or pneumothorax. Bones demineralized. IMPRESSION: Persistent diffuse BILATERAL pulmonary infiltrates question infection versus asymmetric pulmonary edema, little changed. Electronically Signed   By: Ulyses Southward M.D.   On: 02/20/2018 10:52    Scheduled Meds: . allopurinol  300 mg Oral QPM  . apixaban  5 mg Oral BID  . aspirin EC  81 mg Oral Daily  . atorvastatin  20 mg Oral q1800  . furosemide  40 mg Oral Daily  . gemfibrozil  600 mg Oral BID AC  . guaiFENesin  600 mg Oral BID  . insulin aspart  0-9 Units Subcutaneous TID WC  . ipratropium  0.5 mg Nebulization BID  . levalbuterol  1.25 mg Nebulization BID  . liver oil-zinc oxide   Topical q  morning - 10a  . methylPREDNISolone (SOLU-MEDROL) injection  60 mg Intravenous Q12H  . metoprolol succinate  25 mg Oral Daily  . nystatin  5 mL Oral QID  . senna-docusate  1 tablet Oral BID  . sodium chloride flush  3 mL Intravenous Q12H  . tamsulosin  0.4 mg Oral QPC supper    Continuous Infusions: . sodium chloride 250 mL (02/19/18 1437)  . cefTRIAXone (ROCEPHIN)  IV Stopped (02/20/18 1300)  . magnesium sulfate 1 - 4 g bolus IVPB       Time spent: 68  Haydee Salter MD [facility] Pager AMION. If 7PM-7AM, please contact night-coverage at [email or web address], password [email or web address] 02/20/2018, 6:30 PM  LOS: 12 days

## 2018-02-20 NOTE — Progress Notes (Signed)
Progress Note  Patient Name: Craig [name] Date of Encounter: 02/20/2018  Primary Cardiologist: Nona Dell, MD   Subjective   Continues to make progress.  No dyspnea at rest, no difference in breathing when he lies flat.  Oxygen down to 2 L by nasal cannula, although he did require temporary increase in oxygen to 4 L when standing up to be weighed earlier today.  No cough or pleurisy. Remains in well rate controlled atrial fibrillation. Creat stable at 1.44. White blood cell count remains elevated at 16.8K, has been elevated to a similar degree for the last 4 days (probably steroid related?).  Just had another chest x-ray performed.  Report not yet available.  On my review there are still bilateral basilar infiltrates, but improved from the previous couple of studies.  Inpatient Medications    Scheduled Meds: . allopurinol  300 mg Oral QPM  . apixaban  5 mg Oral BID  . aspirin EC  81 mg Oral Daily  . atorvastatin  20 mg Oral q1800  . furosemide  40 mg Oral Daily  . gemfibrozil  600 mg Oral BID AC  . guaiFENesin  600 mg Oral BID  . insulin aspart  0-9 Units Subcutaneous TID WC  . ipratropium  0.5 mg Nebulization TID  . levalbuterol  1.25 mg Nebulization TID  . liver oil-zinc oxide   Topical q morning - 10a  . methylPREDNISolone (SOLU-MEDROL) injection  60 mg Intravenous Q12H  . metoprolol succinate  25 mg Oral Daily  . nystatin  5 mL Oral QID  . senna-docusate  1 tablet Oral BID  . sodium chloride flush  3 mL Intravenous Q12H  . tamsulosin  0.4 mg Oral QPC supper   Continuous Infusions: . sodium chloride 250 mL (02/19/18 1437)  . cefTRIAXone (ROCEPHIN)  IV Stopped (02/19/18 1507)  . magnesium sulfate 1 - 4 g bolus IVPB     PRN Meds: sodium chloride, acetaminophen, ALPRAZolam, artificial tears, magnesium hydroxide, menthol-cetylpyridinium, ondansetron (ZOFRAN) IV, phenol, sodium chloride, sodium chloride flush   Vital Signs    Vitals:   02/19/18 2024 02/19/18 2333  02/20/18 0200 02/20/18 0432  BP:  118/72  123/80  Pulse:  96  92  Resp:  (!) 21  20  Temp:  97.7 F (36.5 C)    TempSrc:  Axillary    SpO2: 93% 94% 96% 92%  Weight:    167 lb 9.6 oz (76 kg)  Height:        Intake/Output Summary (Last 24 hours) at 02/20/2018 0801 Last data filed at 02/20/2018 0432 Gross per 24 hour  Intake 1099.83 ml  Output 1800 ml  Net -700.17 ml   Filed Weights   02/18/18 0538 02/19/18 0327 02/20/18 0432  Weight: 173 lb 12.8 oz (78.8 kg) 168 lb 6.4 oz (76.4 kg) 167 lb 9.6 oz (76 kg)    Telemetry    Atrial fibrillation with controlled ventricular response, no new episodes of VT since yesterday afternoon- Personally Reviewed  ECG    No new tracing- Personally Reviewed  Physical Exam  Appears elderly, lean and rather frail GEN: No acute distress.   Neck: No JVD Cardiac:   Irregular, no murmurs, rubs, or gallops.  Respiratory:   Dry crackles in both bases GI: Soft, nontender, non-distended  MS: No edema; No deformity. Neuro:  Nonfocal  Psych: Normal affect   Labs    Chemistry Recent Labs  Lab 02/15/18 0452  02/18/18 0441 02/19/18 0523 02/20/18 0459  NA 139   < >  138 138 137  K 4.2   < > 5.1 4.5 4.3  CL 101   < > 100* 100* 101  CO2 26   < > GLUCOSE 168*   < > 139* 151* 142*  BUN 58*   < > 53* 50* 46*  CREATININE 1.45*   < > 1.40* 1.43* 1.44*  CALCIUM 9.8   < > 9.6 9.4 9.2  PROT 6.2*  --   --   --   --   ALBUMIN 2.7*  --   --   --   --   AST 22  --   --   --   --   ALT 18  --   --   --   --   ALKPHOS 87  --   --   --   --   BILITOT 1.1  --   --   --   --   GFRNONAA 44*   < > 46* 45* 44*  GFRAA 51*   < > 53* 52* 51*  ANIONGAP 12   < > < > = values in this interval not displayed.     Hematology Recent Labs  Lab 02/17/18 0416 02/19/18 0523 02/20/18 0459  WBC 12.6* 15.7* 16.8*  RBC 4.71 4.77 4.88  HGB 16.2 16.3 16.7  HCT 48.0 48.3 48.9  MCV 101.9* 101.3* 100.2*  MCH 34.4* 34.2* 34.2*  MCHC 33.8 33.7 34.2  RDW  13.5 13.7 13.6  PLT 252 265 241    Cardiac EnzymesNo results for input(s): TROPONINI in the last 168 hours. No results for input(s): TROPIPOC in the last 168 hours.   BNP Recent Labs  Lab 02/18/18 0441  BNP 425.5*     DDimer No results for input(s): DDIMER in the last 168 hours.   Radiology    No results found.  Cardiac Studies   Echocardiogram 02/18/18: Study Conclusions  - Left ventricle: The cavity size was normal. There was moderate asymmetric hypertrophy of the apex. There is a small dyskinetic area of apical entrapment. The appearance was consistent with apical variant hypertrophic cardiomyopathy. Systolic function was normal. The estimated ejection fraction was in the range of 60% to 65%. Mild hypokinesis of the basalinferior and inferoseptal myocardium; consistent with infarction in the distribution of the right coronary artery; unchanged from the previous study. Acoustic contrast opacification revealed no evidence ofthrombus. - Aortic valve: There was mild to moderate regurgitation directed centrally in the LVOT. - Mitral valve: There was mild regurgitation. - Left atrium: The atrium was moderately dilated.  Impressions:  - Apical variant hypertrophic cardiomyopathy is now evident, with Definity microbubble contrast    Patient Profile     83 y.o. male with PMH of CAD s/p recent NSTEMI 01/31/18 (RCA occlusion medically managed), permanent atrial fibrillation, CKD stage III, who presented with syncope and found to have acute hypoxic respiratory failure (CHF exacerbation vs aspiration).   Assessment & Plan    1. Acute hypoxic respiratory failure:   Improving over the last few days.  While there may have been a component of pulmonary edema on arrival, current problems with hypoxia are not related to heart failure.  He is still on high-dose steroids for possible inflammatory lung condition.  Would advocate weaning those down quickly due to  the risk of multiple side effects.  He appears to have fairly prominent proximal limb muscle weakness, clearly partly due to deconditioning, but likely worsened by steroids 2.  Acute diastolic CHF:   Echo shows evidence of apical variant hypertrophic cardiomyopathy, which likely explains his tendency to have brief bursts of nonsustained VT.  In addition he has hypokinesis of the inferior wall due to chronic total occlusion of the right coronary artery, but overall LVEF is preserved.  Continue current medications including the current dose of diuretic. 3. CAD s/p NSTEMI: LHC 01/31/18 with RCA occlusion, medically managed. Without anginal complaints. On ASA and statin and low-dose beta-blocker. 4. CKD stage 3:   Renal function has been stable on the current dose of diuretic. 5. Permanent atrial fibrillation: rate well controlled, on appropriate anticoagulation with direct oral anticoagulant. 6. NSVT: possibly related to apical variant HCM.  Has an average of 1-2 episodes of brief nonsustained VT on a daily basis.  These are asymptomatic.  Continue beta-blocker. 7. Deconditioning: patient would like to go to SNF in [place]  For questions or updates, please contact [facility] Please consult [email or web address] for contact info under Cardiology/STEMI.      Signed, Thurmon Fair, MD  02/20/2018, 8:01 AM

## 2018-02-20 NOTE — Progress Notes (Signed)
Physical Therapy Treatment Patient Details Name: Craig [name] MRN: [id number] DOB: [birth year] Today's Date: 02/20/2018    History of Present Illness Craig [name] is an 83yo white male who comes to [facility] on 4/27 from [facility] after SOB adn syncopal episode at home. PT with acute hypoxic respiratory failure and on hi flow nasal cannula. Pt went to floor while weighing AM of 5/7. Pt was admitted at [facility] just two weeks ago found to have NSTEMI. PMH: PAF on eliquis, CAD c recent NSTEMI, CKD3, Lt THA, multiple lumbar spine surgeries. Pt reports "3-4" falls in the last seevral months wherein he describes a LOB and then "wakes up" on the ground unaware of what happened.     PT Comments    Pt with slowly increasing tolerance to activity with lower supplemental SpO2. Amb short distance on 4L of O2 with SpO2 to 84%. Recovered to 88-90% after sitting and resting for ~3 minutes. While recovering instructed pt to breath in through his nose. Pt reported this was difficult and he is primarily a mouth breather.    Follow Up Recommendations  SNF     Equipment Recommendations  None recommended by PT    Recommendations for Other Services       Precautions / Restrictions Precautions Precautions: Fall Restrictions Weight Bearing Restrictions: No    Mobility  Bed Mobility Overal bed mobility: Modified Independent Bed Mobility: Supine to Sit     Supine to sit: Min guard;HOB elevated     General bed mobility comments: Assist for safety and lines  Transfers Overall transfer level: Needs assistance Equipment used: 4-wheeled walker Transfers: Sit to/from Stand Sit to Stand: Min assist         General transfer comment: assist for balance and to bring hips up.  Ambulation/Gait Ambulation/Gait assistance: Min assist Ambulation Distance (Feet): 10 Feet( x 2) Assistive device: 4-wheeled walker Gait Pattern/deviations: Step-through pattern;Decreased stride length;Trunk flexed Gait  velocity: decreased Gait velocity interpretation: <1.31 ft/sec, indicative of household ambulator General Gait Details: Assist for balance and support. Began amb on 3L with SpO2 dropping to 84%. Incr O2 to 4L with SpO2 remaining 84% until seated and then SpO2 returned to 88-90%.    Stairs             Wheelchair Mobility    Modified Rankin (Stroke Patients Only)       Balance Overall balance assessment: Needs assistance;History of Falls Sitting-balance support: Feet supported;No upper extremity supported Sitting balance-Leahy Scale: Good     Standing balance support: During functional activity;Bilateral upper extremity supported Standing balance-Leahy Scale: Poor Standing balance comment: walker and min guard for static standing                            Cognition Arousal/Alertness: Awake/alert Behavior During Therapy: WFL for tasks assessed/performed Overall Cognitive Status: Within Functional Limits for tasks assessed                                        Exercises      General Comments        Pertinent Vitals/Pain Pain Assessment: No/denies pain    Home Living                      Prior Function            PT Goals (  current goals can now be found in the care plan section) Acute Rehab PT Goals PT Goal Formulation: With patient Time For Goal Achievement: 03/06/18 Potential to Achieve Goals: Good Progress towards PT goals: Goals downgraded-see care plan    Frequency    Min 2X/week      PT Plan Current plan remains appropriate    Co-evaluation              AM-PAC PT "6 Clicks" Daily Activity  Outcome Measure  Difficulty turning over in bed (including adjusting bedclothes, sheets and blankets)?: A Little Difficulty moving from lying on back to sitting on the side of the bed? : A Little Difficulty sitting down on and standing up from a chair with arms (e.g., wheelchair, bedside commode, etc,.)?:  Unable Help needed moving to and from a bed to chair (including a wheelchair)?: A Little Help needed walking in hospital room?: A Little Help needed climbing 3-5 steps with a railing? : Total 6 Click Score: 14    End of Session Equipment Utilized During Treatment: Gait belt;Oxygen Activity Tolerance: Treatment limited secondary to medical complications (Comment);Patient limited by fatigue(decr SpO2) Patient left: in chair;with call bell/phone within reach;with chair alarm set;with family/visitor present Nurse Communication: Mobility status;Other (comment)(SpO2) PT Visit Diagnosis: History of falling (Z91.81);Difficulty in walking, not elsewhere classified (R26.2);Unsteadiness on feet (R26.81)     Time: 1610-9604 PT Time Calculation (min) (ACUTE ONLY): 26 min  Charges:  $Gait Training: 23-37 mins                    G Codes:       [facility] PT [phone]    Angelina Ok [employer] [facility] 02/20/2018, 5:02 PM

## 2018-02-21 LAB — CBC WITH DIFFERENTIAL/PLATELET
Basophils Absolute: 0 10*3/uL (ref 0.0–0.1)
Basophils Relative: 0 %
Eosinophils Absolute: 0 10*3/uL (ref 0.0–0.7)
Eosinophils Relative: 0 %
HCT: 50.8 % (ref 39.0–52.0)
HEMOGLOBIN: 17.7 g/dL — AB (ref 13.0–17.0)
LYMPHS ABS: 1.2 10*3/uL (ref 0.7–4.0)
Lymphocytes Relative: 5 %
MCH: 35 pg — AB (ref 26.0–34.0)
MCHC: 34.8 g/dL (ref 30.0–36.0)
MCV: 100.6 fL — ABNORMAL HIGH (ref 78.0–100.0)
MONO ABS: 0.5 10*3/uL (ref 0.1–1.0)
MONOS PCT: 2 %
NEUTROS ABS: 20.8 10*3/uL — AB (ref 1.7–7.7)
NEUTROS PCT: 93 %
Platelets: 264 10*3/uL (ref 150–400)
RBC: 5.05 MIL/uL (ref 4.22–5.81)
RDW: 14 % (ref 11.5–15.5)
WBC: 22.6 10*3/uL — ABNORMAL HIGH (ref 4.0–10.5)

## 2018-02-21 LAB — GLUCOSE, CAPILLARY
Glucose-Capillary: 116 mg/dL — ABNORMAL HIGH (ref 65–99)
Glucose-Capillary: 190 mg/dL — ABNORMAL HIGH (ref 65–99)
Glucose-Capillary: 179 mg/dL — ABNORMAL HIGH (ref 65–99)
Glucose-Capillary: 122 mg/dL — ABNORMAL HIGH (ref 65–99)

## 2018-02-21 LAB — BASIC METABOLIC PANEL
ANION GAP: 13 (ref 5–15)
BUN: 44 mg/dL — ABNORMAL HIGH (ref 6–20)
CO2: 27 mmol/L (ref 22–32)
CREATININE: 1.48 mg/dL — AB (ref 0.61–1.24)
Calcium: 9.5 mg/dL (ref 8.9–10.3)
Chloride: 96 mmol/L — ABNORMAL LOW (ref 101–111)
GFR calc non Af Amer: 43 mL/min — ABNORMAL LOW (ref >60)
GFR, EST AFRICAN AMERICAN: 50 mL/min — AB (ref >60)
GLUCOSE: 124 mg/dL — AB (ref 65–99)
Potassium: 3.8 mmol/L (ref 3.5–5.1)
Sodium: 136 mmol/L (ref 135–145)

## 2018-02-21 MED ORDER — LEVALBUTEROL HCL 1.25 MG/0.5ML IN NEBU
1.2500 mg | INHALATION_SOLUTION | Freq: Three times a day (TID) | RESPIRATORY_TRACT | Status: DC
  Administered 2018-02-21 (×2): 1.25 mg via RESPIRATORY_TRACT
  Filled 2018-02-21 (×3): qty 0.5

## 2018-02-21 MED ORDER — LEVALBUTEROL HCL 1.25 MG/0.5ML IN NEBU
1.2500 mg | INHALATION_SOLUTION | Freq: Four times a day (QID) | RESPIRATORY_TRACT | Status: DC | PRN
Start: 2018-02-21 — End: 2018-02-26

## 2018-02-21 MED ORDER — CEFDINIR 300 MG PO CAPS
300.0000 mg | ORAL_CAPSULE | Freq: Two times a day (BID) | ORAL | Status: DC
  Administered 2018-02-22 – 2018-02-26 (×9): 300 mg via ORAL
  Filled 2018-02-21 (×9): qty 1

## 2018-02-21 MED ORDER — PREDNISONE 10 MG PO TABS
50.0000 mg | ORAL_TABLET | Freq: Every day | ORAL | Status: DC
  Administered 2018-02-21 – 2018-02-26 (×6): 50 mg via ORAL
  Filled 2018-02-21 (×6): qty 2

## 2018-02-21 NOTE — Progress Notes (Signed)
Progress Note  Patient Name: Craig [name] Date of Encounter: 02/21/2018  Primary Cardiologist: Nona Dell, MD   Subjective   Difficult night, had steadily worsening pleuritic chest pain in the right lower chest.  He "feels it with every breath".  WBC continues to rise.  On Eliquis and antibiotics (ceftriaxone). I doubt that today's weight is accurate.  He has not lost 13 pounds since yesterday. In/out mildly negative, 0.7L.  Stable renal parameters  Inpatient Medications    Scheduled Meds: . allopurinol  300 mg Oral QPM  . apixaban  5 mg Oral BID  . aspirin EC  81 mg Oral Daily  . atorvastatin  20 mg Oral q1800  . furosemide  40 mg Oral Daily  . gemfibrozil  600 mg Oral BID AC  . guaiFENesin  600 mg Oral BID  . insulin aspart  0-9 Units Subcutaneous TID WC  . ipratropium  0.5 mg Nebulization BID  . levalbuterol  1.25 mg Nebulization Q8H  . liver oil-zinc oxide   Topical q morning - 10a  . metoprolol succinate  25 mg Oral Daily  . nystatin  5 mL Oral QID  . predniSONE  50 mg Oral Q breakfast  . senna-docusate  1 tablet Oral BID  . sodium chloride flush  3 mL Intravenous Q12H  . tamsulosin  0.4 mg Oral QPC supper   Continuous Infusions: . sodium chloride 250 mL (02/19/18 1437)  . cefTRIAXone (ROCEPHIN)  IV Stopped (02/20/18 1300)  . magnesium sulfate 1 - 4 g bolus IVPB     PRN Meds: sodium chloride, acetaminophen, ALPRAZolam, artificial tears, magnesium hydroxide, menthol-cetylpyridinium, ondansetron (ZOFRAN) IV, phenol, sodium chloride, sodium chloride flush   Vital Signs    Vitals:   02/20/18 1928 02/20/18 2017 02/21/18 0008 02/21/18 0457  BP:  101/63 120/90 122/80  Pulse:  94  80  Resp:  18  (!) 21  Temp:  (!) 97.4 F (36.3 C) (!) 97.5 F (36.4 C) 97.8 F (36.6 C)  TempSrc:  Oral Oral Oral  SpO2: 97% 90%  94%  Weight:    154 lb 8.7 oz (70.1 kg)  Height:        Intake/Output Summary (Last 24 hours) at 02/21/2018 0948 Last data filed at 02/21/2018  0458 Gross per 24 hour  Intake 278 ml  Output 1325 ml  Net -1047 ml   Filed Weights   02/19/18 0327 02/20/18 0432 02/21/18 0457  Weight: 168 lb 6.4 oz (76.4 kg) 167 lb 9.6 oz (76 kg) 154 lb 8.7 oz (70.1 kg)    Telemetry    Atrial fibrillation with controlled ventricular response- Personally Reviewed  ECG    No new tracing- Personally Reviewed  Physical Exam  Appears frail, but able to smile GEN: No acute distress.   Neck: No JVD Cardiac:  irregular, no murmurs, rubs, or gallops.  Respiratory: dry crackles both bases GI: Soft, nontender, non-distended  MS: No edema; No deformity. Neuro:  Nonfocal  Psych: Normal affect   Labs    Chemistry Recent Labs  Lab 02/15/18 0452  02/19/18 0523 02/20/18 0459 02/21/18 0507  NA 139   < > 138 137 136  K 4.2   < > 4.5 4.3 3.8  CL 101   < > 100* 101 96*  CO2 26   < > GLUCOSE 168*   < > 151* 142* 124*  BUN 58*   < > 50* 46* 44*  CREATININE 1.45*   < >  1.43* 1.44* 1.48*  CALCIUM 9.8   < > 9.4 9.2 9.5  PROT 6.2*  --   --   --   --   ALBUMIN 2.7*  --   --   --   --   AST 22  --   --   --   --   ALT 18  --   --   --   --   ALKPHOS 87  --   --   --   --   BILITOT 1.1  --   --   --   --   GFRNONAA 44*   < > 45* 44* 43*  GFRAA 51*   < > 52* 51* 50*  ANIONGAP 12   < > < > = values in this interval not displayed.     Hematology Recent Labs  Lab 02/19/18 0523 02/20/18 0459 02/21/18 0507  WBC 15.7* 16.8* 22.6*  RBC 4.77 4.88 5.05  HGB 16.3 16.7 17.7*  HCT 48.3 48.9 50.8  MCV 101.3* 100.2* 100.6*  MCH 34.2* 34.2* 35.0*  MCHC 33.7 34.2 34.8  RDW 13.7 13.6 14.0  PLT 265 241 264    Cardiac EnzymesNo results for input(s): TROPONINI in the last 168 hours. No results for input(s): TROPIPOC in the last 168 hours.   BNP Recent Labs  Lab 02/18/18 0441  BNP 425.5*     DDimer No results for input(s): DDIMER in the last 168 hours.   Radiology    Dg Chest 2 View  Result Date: 02/20/2018 CLINICAL DATA:   Shortness of breath, atrial fibrillation, coronary artery disease EXAM: CHEST - 2 VIEW COMPARISON:  02/13/2018 FINDINGS: Upper normal heart size. Atherosclerotic calcification aorta. Mediastinal contours normal. Asymmetric pulmonary infiltrates RIGHT greater than LEFT question pneumonia versus asymmetric edema. No definite pleural effusion or pneumothorax. Bones demineralized. IMPRESSION: Persistent diffuse BILATERAL pulmonary infiltrates question infection versus asymmetric pulmonary edema, little changed. Electronically Signed   By: Ulyses Southward M.D.   On: 02/20/2018 10:52    Cardiac Studies   Echocardiogram 02/18/18: Study Conclusions  - Left ventricle: The cavity size was normal. There was moderate asymmetric hypertrophy of the apex. There is a small dyskinetic area of apical entrapment. The appearance was consistent with apical variant hypertrophic cardiomyopathy. Systolic function was normal. The estimated ejection fraction was in the range of 60% to 65%. Mild hypokinesis of the basalinferior and inferoseptal myocardium; consistent with infarction in the distribution of the right coronary artery; unchanged from the previous study. Acoustic contrast opacification revealed no evidence ofthrombus. - Aortic valve: There was mild to moderate regurgitation directed centrally in the LVOT. - Mitral valve: There was mild regurgitation. - Left atrium: The atrium was moderately dilated.  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 male with PMH of CAD s/p recent NSTEMI 01/31/18 (RCA occlusion medically managed), permanent atrial fibrillation, CKD stage III, who presented with syncope and found to have acute hypoxic respiratory failure (CHF exacerbation vs aspiration). Echo with contrast on this admission is consistent with apical variant hypertrophic  cardiomyopathy.  Assessment & Plan    1. Acute hypoxic respiratory failure:  Improving over the last few days.  While there may have been a component of pulmonary edema on arrival, current problems with hypoxia are not related to heart failure in my opinion.   He now has pleuritic right-sided chest discomfort and worsening leukocytosis.  It is possible that he has pneumonia with an organism that is resistant to ceftriaxone.  I would recommend reevaluation by the service, consider repeat CT. It would be highly unusual for him to have a pulmonary embolism since he is fully anticoagulated with Eliquis; he did not have any physical findings to suggest lower extremity DVT. He is still on high-dose steroids for possible inflammatory lung condition.  Would advocate weaning those down quickly due to the risk of multiple side effects.  He appears to have fairly prominent proximal limb muscle weakness, clearly partly due to deconditioning, but likely worsened by steroids. 2. Acute on chronic diastolic CHF:  Echo shows evidence of apical variant hypertrophic cardiomyopathy, which likely explains his tendency to have brief bursts of nonsustained VT.  In addition he has hypokinesis of the inferior wall due to chronic total occlusion of the right coronary artery, but overall LVEF is preserved.  Continue current medications including the current dose of diuretic. 3. CAD s/p NSTEMI:LHC 01/31/18 with RCA occlusion, medically managed. Without anginal complaints. On ASA and statin and low-dose beta-blocker. 4. CKD stage 3:  Renal function has been stable on the current dose of diuretic. 5. Permanent atrial fibrillation:rate well controlled, on appropriate anticoagulation with direct oral anticoagulant. 6. NSVT:possibly related to apical variant HCM.  Has an average of 1-2 episodes of brief nonsustained VT on a daily basis.  These are asymptomatic.  Continue beta-blocker. 7. Deconditioning:patient would like to go to  SNF in [place].   Cardiac problems are currently well compensated.  Cardiology will see over the weekend if needed, please call.   For questions or updates, please contact [facility] Please consult [email or web address] for contact info under Cardiology/STEMI.      Signed, Thurmon Fair, MD  02/21/2018, 9:48 AM

## 2018-02-21 NOTE — Care Management Important Message (Signed)
Important Message  Patient Details  Name: Craig [name] MRN: [id number] Date of Birth: Mar 28, 1935   Medicare Important Message Given:  Yes    Kyleen Villatoro P Nakeshia Waldeck 02/21/2018, 1:25 PM

## 2018-02-21 NOTE — Progress Notes (Signed)
Pulmonary lab notified of 6 min walk test.

## 2018-02-21 NOTE — Progress Notes (Signed)
CSW met with patient and sister in law at bedside. Patient quiet throughout meeting, though did nod and made eye contact with CSW. Sister in law upset about plans for patient to discharge today; she expressed concern about patient's condition. Paged MD - MD to meet with patient and sister in law at bedside.  CSW confirmed patient's bed available at [facility] in [place]. They are ready to take patient today. Patient and sister in law agreeable to pay out of pocket for PTAR, as [facility] is outside of 50 mile range for PTAR. CSW informed patient of payment procedure.   CSW to follow and support with discharge.  Estanislado Emms, [place]

## 2018-02-21 NOTE — Progress Notes (Addendum)
PROGRESS NOTE  Craig [name] ZOX:[id number] DOB: 31-Dec-1934 DOA: 02/08/2018 PCP: Ignatius Specking, MD  PCP: Ignatius Specking, MD          Brief Narrative by " Dr Antionette Char": Craig [name] a 83 y.o.malewith medical history significant forpermanent atrial fibrillation on Eliquis, coronary artery disease with recentNSTEMImedically managed,and chronic kidney disease stage III, now presenting with shortness of breath and a syncopal episode overnight. Patient had experienced chest pain approximately 2 weeks ago, eventually went into the ED 3 days later after pain had resolved, was found to have had NSTEMIwith troponin reaching 11, underwent catheterization on 01/31/2018, noted to have RCA occlusion, and medical management was recommended. He was discharged home on 02/04/2018, saw his cardiologist in clinic yesterday, seemed to be doing fairly well at that time, but suffered a syncopal episode early this morning when he got up to use the bathroom, and has been experiencing shortness of breath since that time. He denies any chest pain. Denies fevers, chills, or cough. Reports new onset of bilateral ankle edema.  [place]:Upon arrival to the ED, patient is found to beafebrile, saturating in the 80s on room air, and vitals otherwise stable. EKG features atrial fibrillation with T wave inversions. Chemistry panel is notable for glucose of 224 and CBC features macrocytosis without anemia. BNP was elevated to just over thousand and and troponin was elevated to 0.68. A second troponin was obtained and elevated further to 0.81. The patient was treated with 40 mg IV Lasix in the ED and supplemental oxygen. Transfer to [facility] was arranged for admission to the stepdown unit for ongoing evaluation and management of acute pulmonary edema with hypoxic respiratory failure.   Admitted with acute hypoxic respiratory failure, recent NSTEMI, respiratory failure thought to be related to  Heart failure exacerbation. Question of lung diseases.     HPI/Recap of past 24 hours:  Uneventful night, no fever, denies chest pain, no edema  Remain on HFNC but able to wean down to 4 liter this am      Assessment/Plan: Principal Problem:   Acute pulmonary edema (HCC) Active Problems:   Coronary atherosclerosis of native coronary artery   Permanent atrial fibrillation (HCC)   Non-STEMI (non-ST elevated myocardial infarction) (HCC)   Respiratory failure (HCC)   Hyperglycemia   CKD (chronic kidney disease), stage III (HCC)   ILD (interstitial lung disease) (HCC)   Pressure injury of skin   Muscular deconditioning   Apical variant hypertrophic cardiomyopathy (HCC)  Acute hypoxic respiratory failure (improving) -Unclear etiology, pulmonary edema and pneumonitis -lasix discontinued on 5/3 due to significant orthostatic hypotension, elevated bun/cr - high-dose steroid, nebs , weaned  HFNC 9L->4L HFNC-->4L [place]--2L [place] -Cardiology and pulmonology (signed off) following, input appreciated - ANCA, ANA, rh all neg - pt making slow progress, will try abx - flutter valve, IS, mucinex bid - walk test done, pt on 2L at rest and 4L when ambulatory - consider CT chest in AM - possible d/c to SNF tomorrow == Discussed case with Dr. Craige Cotta.  States at this point pulmonary has nothing else to offer the patient.  States he does not feel that patient has an infection.  States white count is likely due to steroids only.  Had long talk with patient, his power of attorney and a family member (via phone) in the medical field at the request of the power of attorney.  Explained to them that patient on CT has emphysematous changes.  Also explained that  there are inflammatory infiltrates that pulmonology does not feel his infection and is likely an inflammatory condition or something of the like that will need to be managed with steroids and diagnosed with lab work and possibly biopsy.  Since patient is  getting better with combined therapy.  Will continue antibiotics, steroids and neb treatments.  Patient does have emphysematous changes.  So treating like COPD exacerbation.  Steroids will need to be continued on long taper at discharge.  Patient will need referral to [facility] pulmonology on discharge.  Leukocytosis:  From steroids? hemoconcentration from lasix? No fever,  Will monitor  N-STEMI;  -he was hospitalized  From 4/18 to 4/23 to cardiology service for NSTEMI - has cardiac cath on 4/19 with Known occlusion of RCA on cath 4/19 with medical mgmt recommended -denies chest pain, Limited ECHO EF 50 % -Cardiology following.  - no CP   A fib;  Remain in afib on metoprolol and eliquis.  Heart rate seems better .  Oral thrush: topical nystatin solution, improved  Hypokalemia/hypomagnesemia: resolved. monitor  CKD stage III  Monitor renal function on lasix. Doing well  BPH: d/c cardura, due to orthostatic hypotension, change to flomax No issues  Gout: stable on allopurinol  FTT, reports lives by himself, had progressive weakness in the last few weeks,  PT eval, he is very weak and deconditioned currently,    Code Status: full  Family Communication: patient   Disposition Plan: not ready to discharge, need cardiology and pulmonology clearance, need to wean HFNC Patient agreed to snf at discharge  Consultants:  cardiology  Pulmonology/critical care  Procedures:  none  Antibiotics:  none  S: no complaints. SOB better again today.  Objective: BP 115/69 (BP Location: Left Arm)   Pulse (!) 101   Temp (!) 97.5 F (36.4 C) (Axillary)   Resp (!) 25   Ht  (1.854 m)   Wt 70.1 kg (154 lb 8.7 oz)   SpO2 94%   BMI 20.39 kg/m   Intake/Output Summary (Last 24 hours) at 02/21/2018 1855 Last data filed at 02/21/2018 1700 Gross per 24 hour  Intake 240 ml  Output 1400 ml  Net -1160 ml   Filed Weights   02/19/18 0327 02/20/18 0432 02/21/18 0457  Weight:  76.4 kg (168 lb 6.4 oz) 76 kg (167 lb 9.6 oz) 70.1 kg (154 lb 8.7 oz)    Exam: Patient is examined daily including today on 02/21/2018, exams remain the same as of yesterday except that has changed    General:  NAD, weak  Cardiovascular: IRRR  Respiratory:  no wheeze, crackles in bases  Abdomen: Soft/ND/NT, positive BS  Musculoskeletal: No Edema  Neuro: alert, oriented x3  Data Reviewed: Basic Metabolic Panel: Recent Labs  Lab 02/15/18 0452  02/17/18 0416 02/18/18 0441 02/19/18 0523 02/20/18 0459 02/21/18 0507  NA 139   < > 137 138 138 137 136  K 4.2   < > 5.0 5.1 4.5 4.3 3.8  CL 101   < > 98* 100* 100* 101 96*  CO2 26   < > GLUCOSE 168*   < > 153* 139* 151* 142* 124*  BUN 58*   < > 54* 53* 50* 46* 44*  CREATININE 1.45*   < > 1.43* 1.40* 1.43* 1.44* 1.48*  CALCIUM 9.8   < > 9.5 9.6 9.4 9.2 9.5  MG 2.7*  --   --   --   --   --   --    < > =  values in this interval not displayed.   Liver Function Tests: Recent Labs  Lab 02/15/18 0452  AST 22  ALT 18  ALKPHOS 87  BILITOT 1.1  PROT 6.2*  ALBUMIN 2.7*   No results for input(s): LIPASE, AMYLASE in the last 168 hours. No results for input(s): AMMONIA in the last 168 hours. CBC: Recent Labs  Lab 02/16/18 0407 02/17/18 0416 02/19/18 0523 02/20/18 0459 02/21/18 0507  WBC 16.9* 12.6* 15.7* 16.8* 22.6*  NEUTROABS  --  11.7*  --  15.5* 20.8*  HGB 17.1* 16.2 16.3 16.7 17.7*  HCT 50.6 48.0 48.3 48.9 50.8  MCV 102.4* 101.9* 101.3* 100.2* 100.6*  PLT 271 252 265 241 264   Cardiac Enzymes:   No results for input(s): CKTOTAL, CKMB, CKMBINDEX, TROPONINI in the last 168 hours. BNP (last 3 results) Recent Labs    02/10/18 0453 02/18/18 0441  BNP 647.3* 425.5*    ProBNP (last 3 results) No results for input(s): PROBNP in the last 8760 hours.  CBG: Recent Labs  Lab 02/20/18 1545 02/20/18 2218 02/21/18 0743 02/21/18 1134 02/21/18 1652  GLUCAP 169* 156* 116* 190* 179*    No results  found for this or any previous visit (from the past 240 hour(s)).   Studies: No results found.  Scheduled Meds: . allopurinol  300 mg Oral QPM  . apixaban  5 mg Oral BID  . aspirin EC  81 mg Oral Daily  . atorvastatin  20 mg Oral q1800  . [START ON 02/22/2018] cefdinir  300 mg Oral Q12H  . furosemide  40 mg Oral Daily  . gemfibrozil  600 mg Oral BID AC  . guaiFENesin  600 mg Oral BID  . insulin aspart  0-9 Units Subcutaneous TID WC  . ipratropium  0.5 mg Nebulization BID  . levalbuterol  1.25 mg Nebulization Q8H  . liver oil-zinc oxide   Topical q morning - 10a  . metoprolol succinate  25 mg Oral Daily  . nystatin  5 mL Oral QID  . predniSONE  50 mg Oral Q breakfast  . senna-docusate  1 tablet Oral BID  . sodium chloride flush  3 mL Intravenous Q12H  . tamsulosin  0.4 mg Oral QPC supper    Continuous Infusions: . sodium chloride 250 mL (02/19/18 1437)     Time spent: 30  Haydee Salter MD [facility] Pager AMION. If 7PM-7AM, please contact night-coverage at [email or web address], password [facility] 02/21/2018, 6:55 PM  LOS: 13 days

## 2018-02-22 ENCOUNTER — Inpatient Hospital Stay (HOSPITAL_COMMUNITY): Payer: Medicare Other

## 2018-02-22 ENCOUNTER — Encounter (HOSPITAL_COMMUNITY): Payer: Self-pay | Admitting: Interventional Radiology

## 2018-02-22 DIAGNOSIS — J939 Pneumothorax, unspecified: Secondary | ICD-10-CM

## 2018-02-22 HISTORY — PX: IR PERC PLEURAL DRAIN W/INDWELL CATH W/IMG GUIDE: IMG5383

## 2018-02-22 LAB — CBC
HEMATOCRIT: 47 % (ref 39.0–52.0)
HEMOGLOBIN: 16 g/dL (ref 13.0–17.0)
MCH: 34.1 pg — ABNORMAL HIGH (ref 26.0–34.0)
MCHC: 34 g/dL (ref 30.0–36.0)
MCV: 100.2 fL — AB (ref 78.0–100.0)
PLATELETS: 224 10*3/uL (ref 150–400)
RBC: 4.69 MIL/uL (ref 4.22–5.81)
RDW: 14 % (ref 11.5–15.5)
WBC: 17 10*3/uL — AB (ref 4.0–10.5)

## 2018-02-22 LAB — GLUCOSE, CAPILLARY
GLUCOSE-CAPILLARY: 161 mg/dL — AB (ref 65–99)
Glucose-Capillary: 101 mg/dL — ABNORMAL HIGH (ref 65–99)
Glucose-Capillary: 155 mg/dL — ABNORMAL HIGH (ref 65–99)
Glucose-Capillary: 192 mg/dL — ABNORMAL HIGH (ref 65–99)

## 2018-02-22 LAB — BASIC METABOLIC PANEL
ANION GAP: 12 (ref 5–15)
BUN: 42 mg/dL — ABNORMAL HIGH (ref 6–20)
CHLORIDE: 101 mmol/L (ref 101–111)
CO2: 27 mmol/L (ref 22–32)
Calcium: 9.4 mg/dL (ref 8.9–10.3)
Creatinine, Ser: 1.4 mg/dL — ABNORMAL HIGH (ref 0.61–1.24)
GFR calc Af Amer: 53 mL/min — ABNORMAL LOW (ref >60)
GFR, EST NON AFRICAN AMERICAN: 46 mL/min — AB (ref >60)
GLUCOSE: 108 mg/dL — AB (ref 65–99)
POTASSIUM: 3.3 mmol/L — AB (ref 3.5–5.1)
Sodium: 140 mmol/L (ref 135–145)

## 2018-02-22 MED ORDER — METOPROLOL SUCCINATE ER 50 MG PO TB24: 50.0000 mg | ORAL_TABLET | Freq: Every day | ORAL | Status: DC

## 2018-02-22 MED ORDER — LEVALBUTEROL HCL 1.25 MG/0.5ML IN NEBU
1.2500 mg | INHALATION_SOLUTION | Freq: Three times a day (TID) | RESPIRATORY_TRACT | Status: DC
  Administered 2018-02-22 – 2018-02-26 (×11): 1.25 mg via RESPIRATORY_TRACT
  Filled 2018-02-22 (×11): qty 0.5

## 2018-02-22 MED ORDER — LEVALBUTEROL HCL 1.25 MG/0.5ML IN NEBU
1.2500 mg | INHALATION_SOLUTION | Freq: Four times a day (QID) | RESPIRATORY_TRACT | Status: DC
  Administered 2018-02-22 (×2): 1.25 mg via RESPIRATORY_TRACT
  Filled 2018-02-22 (×2): qty 0.5

## 2018-02-22 MED ORDER — IPRATROPIUM BROMIDE 0.02 % IN SOLN
0.5000 mg | Freq: Four times a day (QID) | RESPIRATORY_TRACT | Status: DC
  Administered 2018-02-22 (×2): 0.5 mg via RESPIRATORY_TRACT
  Filled 2018-02-22 (×2): qty 2.5

## 2018-02-22 MED ORDER — POTASSIUM CHLORIDE CRYS ER 20 MEQ PO TBCR
40.0000 meq | EXTENDED_RELEASE_TABLET | Freq: Once | ORAL | Status: AC
  Administered 2018-02-22: 40 meq via ORAL
  Filled 2018-02-22: qty 2

## 2018-02-22 MED ORDER — FENTANYL CITRATE (PF) 100 MCG/2ML IJ SOLN
INTRAMUSCULAR | Status: AC | PRN
  Administered 2018-02-22 (×2): 25 ug via INTRAVENOUS

## 2018-02-22 MED ORDER — LIDOCAINE HCL 1 % IJ SOLN
INTRAMUSCULAR | Status: AC | PRN
  Administered 2018-02-22: 5 mL

## 2018-02-22 MED ORDER — IPRATROPIUM BROMIDE 0.02 % IN SOLN
0.5000 mg | Freq: Three times a day (TID) | RESPIRATORY_TRACT | Status: DC
  Administered 2018-02-22 – 2018-02-26 (×11): 0.5 mg via RESPIRATORY_TRACT
  Filled 2018-02-22 (×11): qty 2.5

## 2018-02-22 MED ORDER — FENTANYL CITRATE (PF) 100 MCG/2ML IJ SOLN
INTRAMUSCULAR | Status: AC
  Filled 2018-02-22: qty 2

## 2018-02-22 MED ORDER — LIDOCAINE HCL 1 % IJ SOLN
INTRAMUSCULAR | Status: AC
  Filled 2018-02-22: qty 20

## 2018-02-22 MED ORDER — METOPROLOL SUCCINATE ER 25 MG PO TB24
37.5000 mg | ORAL_TABLET | Freq: Every day | ORAL | Status: DC
  Administered 2018-02-22 – 2018-02-26 (×5): 37.5 mg via ORAL
  Filled 2018-02-22 (×5): qty 2

## 2018-02-22 NOTE — Progress Notes (Addendum)
PROGRESS NOTE  Craig [name]:[id number] DOB: [birth year] DOA: 02/08/2018 PCP: Ignatius Specking, MD   LOS: 14 days   Brief Narrative / Interim history: 83 year old male with history of permanent A. fib on chronic anticoagulation with Eliquis, coronary artery disease, recently hospitalized in April 2019 with an NSTEMI and a troponin elevation to 13 (cardiac cath on 4/19 showed RCA occlusion currently recommended for medical management), admitted to the hospital on 4/27 with significant shortness of breath and a syncopal episode at home.   Assessment & Plan: Principal Problem:   Acute pulmonary edema (HCC) Active Problems:   Coronary atherosclerosis of native coronary artery   Permanent atrial fibrillation (HCC)   Non-STEMI (non-ST elevated myocardial infarction) (HCC)   Respiratory failure (HCC)   Hyperglycemia   CKD (chronic kidney disease), stage III (HCC)   ILD (interstitial lung disease) (HCC)   Pressure injury of skin   Muscular deconditioning   Apical variant hypertrophic cardiomyopathy (HCC)   Acute hypoxic respiratory failure -Patient states that after his MI in April 2019 he has been progressively more short of breath barely able to ambulate a few feet without needing to stop and catch his breath -Pulmonology as well as cardiology consulted during this hospitalization -CT scan 1/29 showed bilateral diffuse pulmonary infiltrates involving all lobes, significantly worsened between mid and end April, suspect for infectious versus inflammatory infiltrates.  There were concern for pulmonary edema, and patient was given IV Lasix up until 5/3, when it was discontinued as his BUN/creatinine started to climb.  He now is on p.o. Lasix, he is net -10 L.  Repeat chest x-ray on 5/9 showed persistent bilateral pulmonary infiltrates?  Infection versus asymmetric pulmonary edema with little change.  He also has been maintained on antibiotics with ceftriaxone 5/8-5/10 and Omnicef since  5/10. -Pulmonology cannot exclude inflammatory condition and patient has been on steroids as well, currently on prednisone -ANCA, ANA negative. Dr. Melynda Ripple discussed case with Dr. Craige Cotta.  States at this point pulmonary has nothing else to offer the patient.  States he does not feel that patient has an infection.  States white count is likely due to steroids only.  Had long talk with patient, his power of attorney and a family member (via phone) in the medical field at the request of the power of attorney.  Explained to them that patient on CT has emphysematous changes.  Also explained that there are inflammatory infiltrates that pulmonology does not feel his infection and is likely an inflammatory condition or something of the like that will need to be managed with steroids and diagnosed with lab work and possibly biopsy.  Will continue antibiotics, steroids and neb treatments.  Patient does have emphysematous changes.  So treating like COPD exacerbation.  Steroids will need to be continued on long taper at discharge. -Patient making some progress however he appears tachypneic in the room and when he is talking to me his oxygen drops into the low 80s -Repeat two-view chest x-ray today  Addendum -CXR is back showing right sided pneumothorax. Re-consulted PCCM, d/w Dr. Colletta Maryland.  He will see patient but asked me to touch base with IR for tube placement given the fact that he is on Eliquis.  Discussed with Dr. Loreta Ave, will place chest tube today   Leukocytosis  -Possibly from steroids, no fever, slight improvement today  NSTEMI / CAD -No chest pain, he was hospitalized mid April on cardiology service, post cardiac catheterization which showed known occlusion to RCA, currently continue medical  management  Persistent A. fib -Rates somewhat controlled for most part, has had several bursts into the 150s to 180s overnight, I will increase his metoprolol today continue to monitor on telemetry -Continue  Eliquis  Oral thrush -Continue topical nystatin, improved  Hypokalemia -Continue to replete potassium today  Chronic kidney disease stage III -Creatinine overall stable around 1.4 for few days  BPH -His Cardura was discontinued orthostatic hypotension and was changed to Flomax  Gout -Not an issue   DVT prophylaxis: Eliquis Code Status: Full code Family Communication: no family bedside Disposition Plan: SNF when ready  Consultants:   Cardiology   PCCM  Procedures:   2D echo:  Study Conclusions - Left ventricle: The cavity size was normal. There was moderate asymmetric hypertrophy of the apex. There is a small dyskinetic area of apical entrapment. The appearance was consistent with apical variant hypertrophic cardiomyopathy. Systolic function was normal. The estimated ejection fraction was in the range of 60% to 65%. Mild hypokinesis of the basalinferior and inferoseptal myocardium; consistent with infarction in the distribution of the right coronary artery; unchanged from the previous study. Acoustic contrast opacification revealed no evidence ofthrombus. - Aortic valve: There was mild to moderate regurgitation directed centrally in the LVOT. - Mitral valve: There was mild regurgitation. - Left atrium: The atrium was moderately dilated.  Impressions: - Apical variant hypertrophic cardiomyopathy is now evident, with Definity microbubble contrast.  Antimicrobials:  Ceftriaxone 5/8 -5/10  Omnicef 5/10 >>  Subjective: -Continues to complain of shortness of breath with minimal activity, states he is okay at rest.  He does not appreciate any significant improvement over the last week, feels about the same.  No chest pain.  Objective: Vitals:   02/22/18 0445 02/22/18 0736 02/22/18 0736 02/22/18 0851  BP: 121/83  113/77   Pulse: 98  98 (!) 106  Resp: (!) 22     Temp: 97.8 F (36.6 C)  97.7 F (36.5 C)   TempSrc: Oral  Oral   SpO2: 95% 92% (!) 89%   Weight: 70.4 kg  (155 lb 3.3 oz)     Height:        Intake/Output Summary (Last 24 hours) at 02/22/2018 1033 Last data filed at 02/22/2018 0502 Gross per 24 hour  Intake 483 ml  Output 1200 ml  Net -717 ml   Filed Weights   02/20/18 0432 02/21/18 0457 02/22/18 0445  Weight: 76 kg (167 lb 9.6 oz) 70.1 kg (154 lb 8.7 oz) 70.4 kg (155 lb 3.3 oz)    Examination:  Constitutional: appears slightly tachypneic  Eyes: lids and conjunctivae normal ENMT: Mucous membranes are moist. Neck: normal, supple Respiratory: Bibasilar crackles/rhonchi, no wheezing.  Increased respiratory effort Cardiovascular: Irregular, no murmurs heard. No LE edema.  Abdomen: no tenderness. Bowel sounds positive.  Neurologic: CN 2-12 grossly intact. Strength 5/5 in all 4.  Psychiatric: Normal judgment and insight. Alert and oriented x 3. Normal mood.    Data Reviewed: I have independently reviewed following labs and imaging studies   CBC: Recent Labs  Lab 02/17/18 0416 02/19/18 0523 02/20/18 0459 02/21/18 0507 02/22/18 0611  WBC 12.6* 15.7* 16.8* 22.6* 17.0*  NEUTROABS 11.7*  --  15.5* 20.8*  --   HGB 16.2 16.3 16.7 17.7* 16.0  HCT 48.0 48.3 48.9 50.8 47.0  MCV 101.9* 101.3* 100.2* 100.6* 100.2*  PLT 252 265 241 264 224   Basic Metabolic Panel: Recent Labs  Lab 02/18/18 0441 02/19/18 0523 02/20/18 0459 02/21/18 0507 02/22/18 0611  NA 138  138 137 136 140  K 5.1 4.5 4.3 3.8 3.3*  CL 100* 100* 101 96* 101  CO2 GLUCOSE 139* 151* 142* 124* 108*  BUN 53* 50* 46* 44* 42*  CREATININE 1.40* 1.43* 1.44* 1.48* 1.40*  CALCIUM 9.6 9.4 9.2 9.5 9.4   GFR: Estimated Creatinine Clearance: 41.9 mL/min (A) (by C-G formula based on SCr of 1.4 mg/dL (H)). Liver Function Tests: No results for input(s): AST, ALT, ALKPHOS, BILITOT, PROT, ALBUMIN in the last 168 hours. No results for input(s): LIPASE, AMYLASE in the last 168 hours. No results for input(s): AMMONIA in the last 168 hours. Coagulation Profile: No  results for input(s): INR, PROTIME in the last 168 hours. Cardiac Enzymes: No results for input(s): CKTOTAL, CKMB, CKMBINDEX, TROPONINI in the last 168 hours. BNP (last 3 results) No results for input(s): PROBNP in the last 8760 hours. HbA1C: No results for input(s): HGBA1C in the last 72 hours. CBG: Recent Labs  Lab 02/21/18 0743 02/21/18 1134 02/21/18 1652 02/21/18 2100 02/22/18 0733  GLUCAP 116* 190* 179* 122* 101*   Lipid Profile: No results for input(s): CHOL, HDL, LDLCALC, TRIG, CHOLHDL, LDLDIRECT in the last 72 hours. Thyroid Function Tests: No results for input(s): TSH, T4TOTAL, FREET4, T3FREE, THYROIDAB in the last 72 hours. Anemia Panel: No results for input(s): VITAMINB12, FOLATE, FERRITIN, TIBC, IRON, RETICCTPCT in the last 72 hours. Urine analysis:    Component Value Date/Time   COLORURINE YELLOW 02/22/2011 0905   APPEARANCEUR CLEAR 02/22/2011 0905   LABSPEC 1.010 02/22/2011 0905   PHURINE 6.5 02/22/2011 0905   GLUCOSEU NEGATIVE 02/22/2011 0905   HGBUR NEGATIVE 02/22/2011 0905   BILIRUBINUR NEGATIVE 02/22/2011 0905   KETONESUR NEGATIVE 02/22/2011 0905   PROTEINUR NEGATIVE 02/22/2011 0905   UROBILINOGEN 0.2 02/22/2011 0905   NITRITE NEGATIVE 02/22/2011 0905   LEUKOCYTESUR  02/22/2011 0905    NEGATIVE MICROSCOPIC NOT DONE ON URINES WITH NEGATIVE PROTEIN, BLOOD, LEUKOCYTES, NITRITE, OR GLUCOSE <1000 mg/dL.   Sepsis Labs: Invalid input(s): PROCALCITONIN, LACTICIDVEN  No results found for this or any previous visit (from the past 240 hour(s)).    Radiology Studies: No results found.   Scheduled Meds: . allopurinol  300 mg Oral QPM  . apixaban  5 mg Oral BID  . aspirin EC  81 mg Oral Daily  . atorvastatin  20 mg Oral q1800  . cefdinir  300 mg Oral Q12H  . furosemide  40 mg Oral Daily  . gemfibrozil  600 mg Oral BID AC  . guaiFENesin  600 mg Oral BID  . insulin aspart  0-9 Units Subcutaneous TID WC  . ipratropium  0.5 mg Nebulization QID  .  levalbuterol  1.25 mg Nebulization QID  . liver oil-zinc oxide   Topical q morning - 10a  . metoprolol succinate  37.5 mg Oral Daily  . nystatin  5 mL Oral QID  . potassium chloride  40 mEq Oral Once  . predniSONE  50 mg Oral Q breakfast  . senna-docusate  1 tablet Oral BID  . sodium chloride flush  3 mL Intravenous Q12H  . tamsulosin  0.4 mg Oral QPC supper   Continuous Infusions: . sodium chloride 250 mL (02/19/18 1437)   Pamella Pert, MD, PhD [facility] Pager [phone]  If 7PM-7AM, please contact night-coverage [email or web address] Password [facility] 02/22/2018, 10:33 AM

## 2018-02-22 NOTE — Sedation Documentation (Signed)
Patient is resting comfortably. 

## 2018-02-22 NOTE — Procedures (Signed)
Interventional Radiology Procedure Note  Procedure: right chest tube placement.  To evac chamber  Complications: None Recommendations:  - To waterseal/suction 20mm H20 overnight - Team will follow - Routine wound care - cxr in am   Signed,  Yvone Neu. Loreta Ave, DO

## 2018-02-22 NOTE — Consult Note (Signed)
Chief Complaint: Right Pneumothorax  Referring Physician(s): Dr. Elvera Lennox  Supervising Physician: Gilmer Mor  Patient Status: [facility] - In-pt  History of Present Illness: Craig [name] is a 83 y.o. male admitted to [facility] 02/08/2018 for SOB and syncope.    During his admission he has developed acute respiratory failure, and was noted today on CXR to have a spontaneous right pneumothorax.    He is on NOAC for arrhythmia.    Discussed his case with Dr. Elvera Lennox, who has spoken to pulmonary/CCM team as well as VIR regarding possible thoracostomy tube.    Past Medical History:  Diagnosis Date  . Arthritis   . Coronary atherosclerosis of native coronary artery    Nonobstructive at catheterization 2006  . Essential hypertension, benign   . Mixed hyperlipidemia   . Permanent atrial fibrillation (HCC)    Declined anticoagulation with Dr. Andee Lineman 06/2012    Past Surgical History:  Procedure Laterality Date  . JOINT REPLACEMENT     hip left  . LEFT HEART CATH AND CORONARY ANGIOGRAPHY N/A 01/31/2018   Procedure: LEFT HEART CATH AND CORONARY ANGIOGRAPHY;  Surgeon: Corky Crafts, MD;  Location: [facility] INVASIVE CV LAB;  Service: Cardiovascular;  Laterality: N/A;  . LUMBAR LAMINECTOMY  07/21/2012   Procedure: MICRODISCECTOMY LUMBAR LAMINECTOMY;  Surgeon: Eldred Manges, MD;  Location: [facility] OR;  Service: Orthopedics;  Laterality: Left;  Left L5-S1 Foraminotomy, Microdiscectomy  . MICRODISCECTOMY LUMBAR  07/21/2012  . ULTRASOUND GUIDANCE FOR VASCULAR ACCESS  01/31/2018   Procedure: Ultrasound Guidance For Vascular Access;  Surgeon: Corky Crafts, MD;  Location: [facility] INVASIVE CV LAB;  Service: Cardiovascular;;    Allergies: Patient has no known allergies.  Medications: Prior to Admission medications   Medication Sig Start Date End Date Taking? Authorizing Provider  allopurinol (ZYLOPRIM) 300 MG tablet Take 300 mg by mouth every evening.    Yes [provider]  amLODipine  (NORVASC) 5 MG tablet Take 5 mg by mouth every evening.  04/28/11  Yes [provider]  apixaban (ELIQUIS) 5 MG TABS tablet Take 1 tablet (5 mg total) by mouth 2 (two) times daily. 02/04/18  Yes Georgie Chard D, NP  aspirin EC 81 MG EC tablet Take 1 tablet (81 mg total) by mouth daily. 02/04/18  Yes Georgie Chard D, NP  atorvastatin (LIPITOR) 20 MG tablet Take 1 tablet (20 mg total) by mouth every evening. 02/07/18  Yes Jonelle Sidle, MD  doxazosin (CARDURA) 8 MG tablet Take 8 mg by mouth daily.  04/28/11  Yes [provider]  Emollient (DERMEND BRUISE FORMULA) CREA Apply 1 application topically as needed (dry skin/irritation).    Yes [provider]  furosemide (LASIX) 20 MG tablet Take 20 mg by mouth daily.    Yes [provider]  gemfibrozil (LOPID) 600 MG tablet Take 600 mg by mouth 2 (two) times daily.    Yes [provider]  metoprolol succinate (TOPROL XL) 25 MG 24 hr tablet Take 0.5 tablets (12.5 mg total) by mouth daily. 02/07/18  Yes Jonelle Sidle, MD  sildenafil (REVATIO) 20 MG tablet Take 40-60 mg by mouth daily as needed (unknown use).  12/05/17  Yes [provider]     Family History  Problem Relation Age of Onset  . Diabetes Neg Hx   . Hypertension Neg Hx   . Coronary artery disease Neg Hx     Social History   Socioeconomic History  . Marital status: Married    Spouse name:  Not on file  . Number of children: Not on file  . Years of education: Not on file  . Highest education level: Not on file  Occupational History  . Not on file  Social Needs  . Financial resource strain: Not on file  . Food insecurity:    Worry: Not on file    Inability: Not on file  . Transportation needs:    Medical: Not on file    Non-medical: Not on file  Tobacco Use  . Smoking status: Former Smoker    Packs/day: 1.00    Years: 30.00    Pack years: 30.00    Types: Cigarettes    Last attempt to quit: 10/15/1982    Years since  quitting: 35.3  . Smokeless tobacco: Never Used  Substance and Sexual Activity  . Alcohol use: Yes    Alcohol/week: 8.4 oz    Types: 14 Shots of liquor per week    Comment: Occasional  . Drug use: No  . Sexual activity: Not on file  Lifestyle  . Physical activity:    Days per week: Not on file    Minutes per session: Not on file  . Stress: Not on file  Relationships  . Social connections:    Talks on phone: Not on file    Gets together: Not on file    Attends religious service: Not on file    Active member of club or organization: Not on file    Attends meetings of clubs or organizations: Not on file    Relationship status: Not on file  Other Topics Concern  . Not on file  Social History Narrative  . Not on file       Review of Systems: A 12 point ROS discussed and pertinent positives are indicated in the HPI above.  All other systems are negative.  Review of Systems  Vital Signs: BP 123/82   Pulse 92   Temp 98 F (36.7 C) (Oral)   Resp 20   Ht  (1.854 m)   Wt 155 lb 3.3 oz (70.4 kg)   SpO2 100%   BMI 20.48 kg/m   Physical Exam General: 83 yo male appearing  stated age.  Well-developed, well-nourished.  No distress. HEENT: Atraumatic, normocephalic.  Conjugate gaze, extra-ocular motor intact. No scleral icterus or scleral injection. No lesions on external ears, nose, lips, or gums.  Oral mucosa moist, pink.  Neck: Symmetric with no goiter enlargement.  Chest/Lungs:  On facemask, with labored breathing. Marland Kitchen  Heart:    No JVD appreciated.  Abdomen:  Soft, NT/ND, with + bowel sounds.   Genito-urinary: Deferred Neurologic: Alert & Oriented to person, place, and time.   Normal affect and insight.  Appropriate questions.  Moving all 4 extremities with gross sensory intact.      Imaging: Dg Chest 2 View  Result Date: 02/22/2018 CLINICAL DATA:  Hypoxia EXAM: CHEST - 2 VIEW COMPARISON:  02/20/2018 FINDINGS: Moderate-sized right pneumothorax measuring 4.4 cm at  the apex. Pneumothorax measures approximately 20%. Bilateral interstitial thickening. Bilateral lower lobe airspace disease. Trace left pleural effusion. Stable cardiomediastinal silhouette. No acute osseous abnormality. IMPRESSION: 1. Moderate-sized right pneumothorax measuring approximately 20%. Critical Value/emergent results were called by telephone at the time of interpretation on 02/22/2018 at 2:16 pm to [facility] , who verbally acknowledged these results. 2. Bilateral lower lobe airspace disease most concerning for pneumonia. Electronically Signed   By: Elige Ko   On: 02/22/2018 14:18   Dg Chest  2 View  Result Date: 02/20/2018 CLINICAL DATA:  Shortness of breath, atrial fibrillation, coronary artery disease EXAM: CHEST - 2 VIEW COMPARISON:  02/13/2018 FINDINGS: Upper normal heart size. Atherosclerotic calcification aorta. Mediastinal contours normal. Asymmetric pulmonary infiltrates RIGHT greater than LEFT question pneumonia versus asymmetric edema. No definite pleural effusion or pneumothorax. Bones demineralized. IMPRESSION: Persistent diffuse BILATERAL pulmonary infiltrates question infection versus asymmetric pulmonary edema, little changed. Electronically Signed   By: Ulyses Southward M.D.   On: 02/20/2018 10:52   Ct Chest Wo Contrast  Result Date: 02/10/2018 CLINICAL DATA:  Interstitial lung disease. EXAM: CT CHEST WITHOUT CONTRAST TECHNIQUE: Multidetector CT imaging of the chest was performed following the standard protocol without IV contrast. COMPARISON:  Multiple chest x-rays since July 15, 2012 FINDINGS: Cardiovascular: Atherosclerotic change in the nonaneurysmal aorta. The central pulmonary arteries are normal in caliber. Coronary artery calcifications are noted. Mild cardiomegaly. Mediastinum/Nodes: Small right pleural effusion. No pericardial effusion. The esophagus and thyroid is normal. No adenopathy in the chest. Lungs/Pleura: Central airways are normal. No pneumothorax. There are  some emphysematous changes in the lungs. Focal pulmonary infiltrates are seen bilaterally. The right infiltrates are most prominent in the right perihilar region, involving the upper and lower lobe. The infiltrate on the left is most prominent in the left lower lobe and posteriorly in the left upper lobe. There are some ground-glass opacities as well. Evaluation for nodules or masses is limited due to the diffuse opacities. Upper Abdomen: No acute abnormality. Musculoskeletal: There is an age-indeterminate compression fracture of T10, new since 2013. IMPRESSION: 1. Bilateral diffuse pulmonary infiltrates involving all lobes as above. There was significant worsening between January 30, 2018 and February 08, 2018. Given the rapid onset, the infiltrates are most likely infectious or inflammatory. There may be a mild interstitial background component but the interstitial component is not well assessed due to the diffuse infiltrates. Recommend follow-up to resolution. 2. There is a T10 compression fracture new since 2013 but otherwise age indeterminate. 3. Atherosclerotic change in the nonaneurysmal aorta. Coronary artery calcifications. 4. Emphysema. Aortic Atherosclerosis (ICD10-I70.0) and Emphysema (ICD10-J43.9). Electronically Signed   By: Gerome Sam III M.D   On: 02/10/2018 21:13   Dg Chest Port 1 View  Result Date: 02/13/2018 CLINICAL DATA:  Shortness of breath. EXAM: PORTABLE CHEST 1 VIEW COMPARISON:  Radiograph of February 10, 2018. FINDINGS: Stable cardiomediastinal silhouette. No pneumothorax or pleural effusion is noted. Slightly improved diffuse interstitial and airspace opacities are noted throughout both lungs suggesting improving infection or edema. Bony thorax is unremarkable. IMPRESSION: Slightly improved bilateral diffuse lung opacities suggesting improving edema or infection. Electronically Signed   By: Lupita Raider, M.D.   On: 02/13/2018 09:22   Dg Chest Port 1 View  Result Date:  02/10/2018 CLINICAL DATA:  Hypoxemia EXAM: PORTABLE CHEST 1 VIEW COMPARISON:  02/09/2018 FINDINGS: Diffuse airspace disease and interstitial prominence throughout the lungs, stable. Mild cardiomegaly. No visible effusions or acute bony abnormality. IMPRESSION: Diffuse interstitial and alveolar opacities throughout the lungs could reflect edema or infection, unchanged. Electronically Signed   By: Charlett Nose M.D.   On: 02/10/2018 08:27   Dg Chest Port 1 View  Result Date: 02/09/2018 CLINICAL DATA:  Shortness of breath.  Hypoxemia. EXAM: PORTABLE CHEST 1 VIEW COMPARISON:  February 08, 2018 FINDINGS: Diffuse interstitial opacities throughout the right lung and the left mid and lower lung. No other interval changes. IMPRESSION: Diffuse pulmonary opacities, right greater than left.  No change. Electronically Signed   By: Onalee Hua  Judithe Modest M.D   On: 02/09/2018 15:48   Dg Chest Port 1 View  Result Date: 02/08/2018 CLINICAL DATA:  Acute respiratory failure.  Hypoxia. EXAM: PORTABLE CHEST 1 VIEW COMPARISON:  01/30/2018 FINDINGS: Cardiac enlargement. Diffuse interstitial pattern throughout the lungs. Previous studies demonstrate an underlying interstitial pattern but changes appear more prominent today. This suggest developing edema or interstitial pneumonitis superimposed upon chronic fibrosis. Blunting of the right costophrenic angle may suggest a small effusion. No pneumothorax. Mediastinal contours appear intact. Calcification of the aorta. IMPRESSION: Increasing diffuse interstitial pattern to the lungs likely representing developing edema or interstitial pneumonitis superimposed upon chronic fibrosis. Cardiac enlargement. Aortic atherosclerosis. Electronically Signed   By: Burman Nieves M.D.   On: 02/08/2018 22:55   Dg Chest Port 1 View  Result Date: 01/30/2018 CLINICAL DATA:  Bradycardia. EXAM: PORTABLE CHEST 1 VIEW COMPARISON:  Chest radiograph July 15, 2012 FINDINGS: Cardiac silhouette appears  moderately enlarged. Mediastinal silhouette is nonsuspicious, calcified aortic knob. Similar chronic interstitial changes without pleural effusion or focal consolidation though the RIGHT costophrenic angle incompletely imaged. No pneumothorax. Soft tissue planes and included osseous structures are nonsuspicious. IMPRESSION: Stable cardiomegaly and chronic interstitial changes. Aortic Atherosclerosis (ICD10-I70.0). Electronically Signed   By: Awilda Metro M.D.   On: 01/30/2018 15:58    Labs:  CBC: Recent Labs    02/19/18 0523 02/20/18 0459 02/21/18 0507 02/22/18 0611  WBC 15.7* 16.8* 22.6* 17.0*  HGB 16.3 16.7 17.7* 16.0  HCT 48.3 48.9 50.8 47.0  PLT 265 241 264 224    COAGS: Recent Labs    01/30/18 1800  INR 1.22  APTT 98*    BMP: Recent Labs    02/19/18 0523 02/20/18 0459 02/21/18 0507 02/22/18 0611  NA 138 137 136 140  K 4.5 4.3 3.8 3.3*  CL 100* 101 96* 101  CO2 GLUCOSE 151* 142* 124* 108*  BUN 50* 46* 44* 42*  CALCIUM 9.4 9.2 9.5 9.4  CREATININE 1.43* 1.44* 1.48* 1.40*  GFRNONAA 45* 44* 43* 46*  GFRAA 52* 51* 50* 53*    LIVER FUNCTION TESTS: Recent Labs    01/30/18 1536 02/09/18 0449 02/15/18 0452  BILITOT 0.9 1.2 1.1  AST 48* 27 22  ALT ALKPHOS 79 83 87  PROT 6.9 6.1* 6.2*  ALBUMIN 3.5 2.5* 2.7*    TUMOR MARKERS: No results for input(s): AFPTM, CEA, CA199, CHROMGRNA in the last 8760 hours.  Assessment and Plan:  Craig [name] is 83 year old male with high risk cardiopulmonary morbidities, and new right pneumothorax.    I agree that urgent right chest tube is indicated.    He is on blood thinner, which I have discussed with him as a risk for bleeding.  He understands that his risk is above all-comers, but I believe the placement is indicated to decrease his risk from acute tension pneumothorax.    Risks and benefits discussed with the patient including bleeding, infection, damage to adjacent structures, need for further  surgery/procedure.   All of the patient's questions were answered, patient is agreeable to proceed. Consent signed and in chart.  Plan to proceed with urgent right chest tube.   Thank you for this interesting consult.  I greatly enjoyed meeting Craig [name] and look forward to participating in their care.  A copy of this report was sent to the requesting provider on this date.  Electronically Signed: Gilmer Mor, DO 02/22/2018, 4:11 PM   I spent a  total of 40 Minutes    in face to face in clinical consultation, greater than 50% of which was counseling/coordinating care for spontaneous right pneumothorax, possible chest tube placement.

## 2018-02-22 NOTE — Progress Notes (Signed)
[facility] PCCM Progress Note   BRIEF 83 year old remote smoker with recent NSTEMI and RCA occlusion on cath admitted 4/27 with elevated troponin and acute hypoxic respiratory failure. He was diuresed but  remained severely hypoxic.  He  transitioned off BiPAP  to high flow oxygen , he was started on high-dose steroids  4/29.  02/13/18 and 02/17/18 Results for [facility] (MRN [id number]) as of 02/22/2018 14:58  Ref. Range 02/13/2018 18:18 02/17/2018 11:02  ANA Ab, IFA Unknown  Negative  ANCA Proteinase 3 Latest Ref Range: 0.0 - 3.5 U/mL  <3.5  Anti JO-1 Latest Ref Range: 0.0 - 0.9 AI <0.2   CCP Antibodies IgG/IgA Latest Ref Range: 0 - 19 units 6   Myeloperoxidase Abs Latest Ref Range: 0.0 - 9.0 U/mL  <9.0  RA Latex Turbid. Latest Ref Range: 0.0 - 13.9 IU/mL  <10.0  Cytoplasmic (C-ANCA) Latest Ref Range: Neg:<1:20 titer  <1:20  P-ANCA Latest Ref Range: Neg:<1:20 titer  <1:20  Atypical P-ANCA titer Latest Ref Range: Neg:<1:20 titer  <1:20  ENA SM Ab Ser-aCnc Latest Ref Range: 0.0 - 0.9 AI <0.2   Ribonucleic Protein Latest Ref Range: 0.0 - 0.9 AI <0.2     02/18/18  He is on 10 L high flow nasal cannula this morning.  Denies SOB, chest pain.  Actually states that he feels somewhat better.   SUBJECTIVE/OVERNIGHT/INTERVAL HX 02/22/18 - Now on 2L [place] but recalled due to persistent resp complaints of dyspnea and found to have a 20% spontaneous right . He is on eliquis. Last dose today AM. Has A Fib Rx. He is on prednisone  daily  VITALS Today's Vitals   02/22/18 0851 02/22/18 0900 02/22/18 1110 02/22/18 1151  BP:    117/67  Pulse: (!) 106   (!) 105  Resp:      Temp:    98 F (36.7 C)  TempSrc:    Oral  SpO2:   95% (!) 86%  Weight:      Height:      PainSc:  0-No pain        General Appearance:    Frail male. No distress. Stubble beard  Head:    Normocephalic, without obvious abnormality, atraumatic  Eyes:    PERRL - yes, conjunctiva/corneas - clear      Ears:    Normal external ear  canals, both ears  Nose:   NG tube - no but has 2L [place]  Throat:  ETT TUBE - no , OG tube - no  Neck:   Supple,  No enlargement/tenderness/nodules     Lungs:     Clear to auscultation bilaterall atneriorly. LL has crackles. Unable to distinguish diminished air rentry on right  Chest wall:    No deformity  Heart:    S1 and S2 normal, no murmur, CVP - no.  Pressors - no  Abdomen:     Soft, no masses, no organomegaly  Genitalia:    Not done  Rectal:   not done  Extremities:   Extremities- intact     Skin:   Intact in exposed areas .     Neurologic:   Sedation - none -> RASS - na . Moves all 4s - yes. CAM-ICU - neg . Orientation - x 3 +. Deconditioned +    PULMONARY No results for input(s): PHART, PCO2ART, PO2ART, HCO3, TCO2, O2SAT in the last 168 hours.  Invalid input(s): PCO2, PO2  CBC Recent Labs  Lab 02/20/18 0459 02/21/18 0507 02/22/18 4098  HGB 16.7 17.7* 16.0  HCT 48.9 50.8 47.0  WBC 16.8* 22.6* 17.0*  PLT 241 264 224    COAGULATION No results for input(s): INR in the last 168 hours.  CARDIAC  No results for input(s): TROPONINI in the last 168 hours. No results for input(s): PROBNP in the last 168 hours.   CHEMISTRY Recent Labs  Lab 02/18/18 0441 02/19/18 0523 02/20/18 0459 02/21/18 0507 02/22/18 0611  NA 138 138 137 136 140  K 5.1 4.5 4.3 3.8 3.3*  CL 100* 100* 101 96* 101  CO2 GLUCOSE 139* 151* 142* 124* 108*  BUN 53* 50* 46* 44* 42*  CREATININE 1.40* 1.43* 1.44* 1.48* 1.40*  CALCIUM 9.6 9.4 9.2 9.5 9.4   Estimated Creatinine Clearance: 41.9 mL/min (A) (by C-G formula based on SCr of 1.4 mg/dL (H)).   LIVER No results for input(s): AST, ALT, ALKPHOS, BILITOT, PROT, ALBUMIN, INR in the last 168 hours.   INFECTIOUS No results for input(s): LATICACIDVEN, PROCALCITON in the last 168 hours.   ENDOCRINE CBG (last 3)  Recent Labs    02/21/18 2100 02/22/18 0733 02/22/18 1149  GLUCAP 122* 101* 155*         IMAGING x48h  -  image(s) personally visualized  -   highlighted in bold Dg Chest 2 View  Result Date: 02/22/2018 CLINICAL DATA:  Hypoxia EXAM: CHEST - 2 VIEW COMPARISON:  02/20/2018 FINDINGS: Moderate-sized right pneumothorax measuring 4.4 cm at the apex. Pneumothorax measures approximately 20%. Bilateral interstitial thickening. Bilateral lower lobe airspace disease. Trace left pleural effusion. Stable cardiomediastinal silhouette. No acute osseous abnormality. IMPRESSION: 1. Moderate-sized right pneumothorax measuring approximately 20%. Critical Value/emergent results were called by telephone at the time of interpretation on 02/22/2018 at 2:16 pm to [facility] , who verbally acknowledged these results. 2. Bilateral lower lobe airspace disease most concerning for pneumonia. Electronically Signed   By: Elige Ko   On: 02/22/2018 14:18      A NEw issue of spontaneous right pneumothorax - 20% BP/HR stability + Mild resp disress +  PLAN Dc eliquis Change 2L [place] to 100% face mask Aim for [facility] Catheter - have asked [facility]ad to reach out to IR due to fact patient is on eliquis. Repeat CXR 7pm 02/22/2018  And again 02/23/18   Dr. Kalman Shan, M.D., [facility].C.P Pulmonary and Critical Care Medicine Staff Physician, [facility] Center Director - Interstitial Lung Disease  Program  Pulmonary Fibrosis [facility] at [facility] [place], [place], [address]  Pager: [phone], If no answer or between  15:00h - 7:00h: call [phone] Telephone: [phone]

## 2018-02-22 NOTE — Progress Notes (Signed)
Patient had 25 beats NSVT per CCMD. Patient states "I did not feel it". He is asymptomatic. Will continue to monitor.

## 2018-02-22 NOTE — Sedation Documentation (Signed)
Pt tolerating procedure well at this time.

## 2018-02-22 NOTE — Sedation Documentation (Signed)
Procedure finished. Fentanyl only given. Vitals stable and no complaints of pain at this time.

## 2018-02-23 ENCOUNTER — Inpatient Hospital Stay (HOSPITAL_COMMUNITY): Payer: Medicare Other

## 2018-02-23 DIAGNOSIS — J939 Pneumothorax, unspecified: Secondary | ICD-10-CM

## 2018-02-23 LAB — CBC
HCT: 49.4 % (ref 39.0–52.0)
Hemoglobin: 17.2 g/dL — ABNORMAL HIGH (ref 13.0–17.0)
MCH: 35.2 pg — AB (ref 26.0–34.0)
MCHC: 34.8 g/dL (ref 30.0–36.0)
MCV: 101 fL — ABNORMAL HIGH (ref 78.0–100.0)
Platelets: 207 10*3/uL (ref 150–400)
RBC: 4.89 MIL/uL (ref 4.22–5.81)
RDW: 14.2 % (ref 11.5–15.5)
WBC: 15.6 10*3/uL — ABNORMAL HIGH (ref 4.0–10.5)

## 2018-02-23 LAB — BASIC METABOLIC PANEL
ANION GAP: 12 (ref 5–15)
BUN: 42 mg/dL — AB (ref 6–20)
CALCIUM: 9.5 mg/dL (ref 8.9–10.3)
CHLORIDE: 101 mmol/L (ref 101–111)
CO2: 27 mmol/L (ref 22–32)
Creatinine, Ser: 1.36 mg/dL — ABNORMAL HIGH (ref 0.61–1.24)
GFR calc Af Amer: 55 mL/min — ABNORMAL LOW (ref >60)
GFR, EST NON AFRICAN AMERICAN: 48 mL/min — AB (ref >60)
GLUCOSE: 106 mg/dL — AB (ref 65–99)
POTASSIUM: 3.8 mmol/L (ref 3.5–5.1)
Sodium: 140 mmol/L (ref 135–145)

## 2018-02-23 LAB — GLUCOSE, CAPILLARY
GLUCOSE-CAPILLARY: 98 mg/dL (ref 65–99)
GLUCOSE-CAPILLARY: 150 mg/dL — AB (ref 65–99)
GLUCOSE-CAPILLARY: 180 mg/dL — AB (ref 65–99)
Glucose-Capillary: 134 mg/dL — ABNORMAL HIGH (ref 65–99)

## 2018-02-23 MED ORDER — APIXABAN 5 MG PO TABS
5.0000 mg | ORAL_TABLET | Freq: Two times a day (BID) | ORAL | Status: DC
  Administered 2018-02-23 – 2018-02-26 (×7): 5 mg via ORAL
  Filled 2018-02-23 (×7): qty 1

## 2018-02-23 NOTE — Progress Notes (Signed)
PROGRESS NOTE        PATIENT DETAILS Name: DEMONTRE PADIN Age: 83 y.o. Sex: male Date of Birth: [birth year] Admit Date: 02/08/2018 Admitting Physician Briscoe Deutscher, MD ZOX:WRUE, Angelina Pih, MD  Brief Narrative: Patient is a 83 y.o. male with prior history of A. fib on anticoagulation, CAD-recent cardiac catheterization on 4/19 showed chronically occluded RCA-on medical management-admitted with shortness of breath, found to have acute hypoxic respiratory failure secondary to persistent bilateral pulmonary infiltrates-probably inflammatory in etiology.  Hospital course complicated by development of pneumothorax.  See below for further details  Subjective: Lying comfortably in bed-claims shortness of breath has gradually improved while he has been in the hospital.  Previously was on high flow oxygen-currently on 2 L of oxygen via nasal cannula.  Assessment/Plan: Acute hypoxic respiratory failure: Initially thought to be decompensated diastolic heart failure-however given persistent bilateral pulmonary infiltrates in spite of being -9 L balance-suspect this was probably inflammatory in etiology.  Autoimmune work-up negative so far.  Remains on steroids which will be tapered off slowly.  Hypoxia has gradually improved-previously on high flow O2-now on just 2-3 L of oxygen via nasal cannula-once he has recovered from acute illness-suspect he will need further work-up-and possible lung biopsy/bronchial lavage-if he is continues to have persistent pulmonary infiltrates on lung x-ray.  Spontaneous right-sided pneumothorax: Underwent right-sided chest tube placement by IR-remains with chest tube under waterseal drainage.  Defer further to IR.  Acute on chronic diastolic heart failure: Compensated-volume status appears stable-continue Lasix.  Leukocytosis: Likely secondary to steroids-no indication of infection at this time.  Oral thrush: Continue topical nystatin.  CAD: No  anginal symptoms-shortness of breath above due to above noted reasons.  Recent LHC on 4/19 showed chronic RCA occlusion with recommendations for medical management.  Continue aspirin, statin and metoprolol.  Chronic atrial fibrillation: Rate controlled with metoprolol-Eliquis discontinued on 5/11 for chest tube placement-spoke with IR (Pam Turpin-PA-C) on 5/12-okay to resume Eliquis.   Apical variant hypertrophic cardiomyopathy: Continue beta-blocker-continue to monitor on telemetry  BPH: Continue Flomax  Gout: No evidence of flare-follow  Deconditioning/debility: Secondary to acute illness-SNF on discharge.  DVT Prophylaxis: Full dose anticoagulation with Eliquis  Code Status: Full code  Family Communication: None at bedside  Disposition Plan: Remain inpatient- SNF on discharge-likely over the next 2-3 days-once chest tube has been removed.  Antimicrobial agents: Anti-infectives (From admission, onward)   Start     Dose/Rate Route Frequency Ordered Stop   02/22/18 1000  cefdinir (OMNICEF) capsule 300 mg     300 mg Oral Every 12 hours 02/21/18 1854 02/28/18 0959   02/19/18 1330  cefTRIAXone (ROCEPHIN) 1 g in sodium chloride 0.9 % 100 mL IVPB  Status:  Discontinued     1 g 200 mL/hr over 30 Minutes Intravenous Every 24 hours 02/19/18 1233 02/21/18 1854      Procedures: 5/12>> right-sided chest tube placement by IR  CONSULTS:  cardiology, pulmonary/intensive care and IR  Time spent: 35 minutes-Greater than 50% of this time was spent in counseling, explanation of diagnosis, planning of further management, and coordination of care.  MEDICATIONS: Scheduled Meds: . allopurinol  300 mg Oral QPM  . aspirin EC  81 mg Oral Daily  . atorvastatin  20 mg Oral q1800  . cefdinir  300 mg Oral Q12H  . furosemide  40 mg Oral Daily  .  gemfibrozil  600 mg Oral BID AC  . guaiFENesin  600 mg Oral BID  . insulin aspart  0-9 Units Subcutaneous TID WC  . ipratropium  0.5 mg Nebulization  TID  . levalbuterol  1.25 mg Nebulization TID  . liver oil-zinc oxide   Topical q morning - 10a  . metoprolol succinate  37.5 mg Oral Daily  . nystatin  5 mL Oral QID  . predniSONE  50 mg Oral Q breakfast  . senna-docusate  1 tablet Oral BID  . sodium chloride flush  3 mL Intravenous Q12H  . tamsulosin  0.4 mg Oral QPC supper   Continuous Infusions: . sodium chloride 250 mL (02/19/18 1437)   PRN Meds:.sodium chloride, acetaminophen, ALPRAZolam, artificial tears, levalbuterol, magnesium hydroxide, menthol-cetylpyridinium, ondansetron (ZOFRAN) IV, phenol, sodium chloride, sodium chloride flush   PHYSICAL EXAM: Vital signs: Vitals:   02/23/18 0729 02/23/18 0830 02/23/18 1011 02/23/18 1129  BP: 125/82 123/77  110/72  Pulse: 88 (!) 46 (!) 105 (!) 101  Resp: (!) 24 (!) 22  (!) 25  Temp:      TempSrc:      SpO2: 91% 91%  96%  Weight:      Height:       Filed Weights   02/21/18 0457 02/22/18 0445 02/23/18 0458  Weight: 70.1 kg (154 lb 8.7 oz) 70.4 kg (155 lb 3.3 oz) 70.5 kg (155 lb 6.8 oz)   Body mass index is 20.51 kg/m.   General appearance :Awake, alert, not in any distress.  HEENT: Atraumatic and Normocephalic Neck: supple, no JVD. No cervical lymphadenopathy. No thyromegaly Resp:Good air entry bilaterally, few bibasilar rales CVS: S1 S2 regular, no murmurs.  GI: Bowel sounds present, Non tender and not distended with no gaurding, rigidity or rebound.No organomegaly Extremities: B/L Lower Ext shows no edema, both legs are warm to touch Neurology:  speech clear,Non focal, sensation is grossly intact. Psychiatric: Normal judgment and insight. Alert and oriented x 3. Normal mood. Musculoskeletal:No digital cyanosis Skin:No Rash, warm and dry Wounds:N/A  I have personally reviewed following labs and imaging studies  LABORATORY DATA: CBC: Recent Labs  Lab 02/17/18 0416 02/19/18 0523 02/20/18 0459 02/21/18 0507 02/22/18 0611 02/23/18 0427  WBC 12.6* 15.7* 16.8*  22.6* 17.0* 15.6*  NEUTROABS 11.7*  --  15.5* 20.8*  --   --   HGB 16.2 16.3 16.7 17.7* 16.0 17.2*  HCT 48.0 48.3 48.9 50.8 47.0 49.4  MCV 101.9* 101.3* 100.2* 100.6* 100.2* 101.0*  PLT 252 265 241 264 224 207    Basic Metabolic Panel: Recent Labs  Lab 02/19/18 0523 02/20/18 0459 02/21/18 0507 02/22/18 0611 02/23/18 0427  NA 138 137 136 140 140  K 4.5 4.3 3.8 3.3* 3.8  CL 100* 101 96* 101 101  CO2 GLUCOSE 151* 142* 124* 108* 106*  BUN 50* 46* 44* 42* 42*  CREATININE 1.43* 1.44* 1.48* 1.40* 1.36*  CALCIUM 9.4 9.2 9.5 9.4 9.5    GFR: Estimated Creatinine Clearance: 43.2 mL/min (A) (by C-G formula based on SCr of 1.36 mg/dL (H)).  Liver Function Tests: No results for input(s): AST, ALT, ALKPHOS, BILITOT, PROT, ALBUMIN in the last 168 hours. No results for input(s): LIPASE, AMYLASE in the last 168 hours. No results for input(s): AMMONIA in the last 168 hours.  Coagulation Profile: No results for input(s): INR, PROTIME in the last 168 hours.  Cardiac Enzymes: No results for input(s): CKTOTAL, CKMB, CKMBINDEX, TROPONINI in the last 168 hours.  BNP (last 3 results) No results for input(s): PROBNP in the last 8760 hours.  HbA1C: No results for input(s): HGBA1C in the last 72 hours.  CBG: Recent Labs  Lab 02/22/18 1149 02/22/18 1638 02/22/18 2147 02/23/18 0807 02/23/18 1128  GLUCAP 155* 192* 161* 98 150*    Lipid Profile: No results for input(s): CHOL, HDL, LDLCALC, TRIG, CHOLHDL, LDLDIRECT in the last 72 hours.  Thyroid Function Tests: No results for input(s): TSH, T4TOTAL, FREET4, T3FREE, THYROIDAB in the last 72 hours.  Anemia Panel: No results for input(s): VITAMINB12, FOLATE, FERRITIN, TIBC, IRON, RETICCTPCT in the last 72 hours.  Urine analysis:    Component Value Date/Time   COLORURINE YELLOW 02/22/2011 0905   APPEARANCEUR CLEAR 02/22/2011 0905   LABSPEC 1.010 02/22/2011 0905   PHURINE 6.5 02/22/2011 0905   GLUCOSEU NEGATIVE  02/22/2011 0905   HGBUR NEGATIVE 02/22/2011 0905   BILIRUBINUR NEGATIVE 02/22/2011 0905   KETONESUR NEGATIVE 02/22/2011 0905   PROTEINUR NEGATIVE 02/22/2011 0905   UROBILINOGEN 0.2 02/22/2011 0905   NITRITE NEGATIVE 02/22/2011 0905   LEUKOCYTESUR  02/22/2011 0905    NEGATIVE MICROSCOPIC NOT DONE ON URINES WITH NEGATIVE PROTEIN, BLOOD, LEUKOCYTES, NITRITE, OR GLUCOSE <1000 mg/dL.    Sepsis Labs: Lactic Acid, Venous    Component Value Date/Time   LATICACIDVEN 1.7 02/08/2018 2224    MICROBIOLOGY: No results found for this or any previous visit (from the past 240 hour(s)).  RADIOLOGY STUDIES/RESULTS: Dg Chest 2 View  Result Date: 02/22/2018 CLINICAL DATA:  Hypoxia EXAM: CHEST - 2 VIEW COMPARISON:  02/20/2018 FINDINGS: Moderate-sized right pneumothorax measuring 4.4 cm at the apex. Pneumothorax measures approximately 20%. Bilateral interstitial thickening. Bilateral lower lobe airspace disease. Trace left pleural effusion. Stable cardiomediastinal silhouette. No acute osseous abnormality. IMPRESSION: 1. Moderate-sized right pneumothorax measuring approximately 20%. Critical Value/emergent results were called by telephone at the time of interpretation on 02/22/2018 at 2:16 pm to [facility] , who verbally acknowledged these results. 2. Bilateral lower lobe airspace disease most concerning for pneumonia. Electronically Signed   By: Elige Ko   On: 02/22/2018 14:18   Dg Chest 2 View  Result Date: 02/20/2018 CLINICAL DATA:  Shortness of breath, atrial fibrillation, coronary artery disease EXAM: CHEST - 2 VIEW COMPARISON:  02/13/2018 FINDINGS: Upper normal heart size. Atherosclerotic calcification aorta. Mediastinal contours normal. Asymmetric pulmonary infiltrates RIGHT greater than LEFT question pneumonia versus asymmetric edema. No definite pleural effusion or pneumothorax. Bones demineralized. IMPRESSION: Persistent diffuse BILATERAL pulmonary infiltrates question infection versus asymmetric  pulmonary edema, little changed. Electronically Signed   By: Ulyses Southward M.D.   On: 02/20/2018 10:52   Ct Chest Wo Contrast  Result Date: 02/10/2018 CLINICAL DATA:  Interstitial lung disease. EXAM: CT CHEST WITHOUT CONTRAST TECHNIQUE: Multidetector CT imaging of the chest was performed following the standard protocol without IV contrast. COMPARISON:  Multiple chest x-rays since July 15, 2012 FINDINGS: Cardiovascular: Atherosclerotic change in the nonaneurysmal aorta. The central pulmonary arteries are normal in caliber. Coronary artery calcifications are noted. Mild cardiomegaly. Mediastinum/Nodes: Small right pleural effusion. No pericardial effusion. The esophagus and thyroid is normal. No adenopathy in the chest. Lungs/Pleura: Central airways are normal. No pneumothorax. There are some emphysematous changes in the lungs. Focal pulmonary infiltrates are seen bilaterally. The right infiltrates are most prominent in the right perihilar region, involving the upper and lower lobe. The infiltrate on the left is most prominent in the left lower lobe and posteriorly in the left upper lobe. There are some ground-glass opacities as well. Evaluation  for nodules or masses is limited due to the diffuse opacities. Upper Abdomen: No acute abnormality. Musculoskeletal: There is an age-indeterminate compression fracture of T10, new since 2013. IMPRESSION: 1. Bilateral diffuse pulmonary infiltrates involving all lobes as above. There was significant worsening between January 30, 2018 and February 08, 2018. Given the rapid onset, the infiltrates are most likely infectious or inflammatory. There may be a mild interstitial background component but the interstitial component is not well assessed due to the diffuse infiltrates. Recommend follow-up to resolution. 2. There is a T10 compression fracture new since 2013 but otherwise age indeterminate. 3. Atherosclerotic change in the nonaneurysmal aorta. Coronary artery calcifications. 4.  Emphysema. Aortic Atherosclerosis (ICD10-I70.0) and Emphysema (ICD10-J43.9). Electronically Signed   By: Gerome Sam III M.D   On: 02/10/2018 21:13   Dg Chest Port 1 View  Result Date: 02/23/2018 CLINICAL DATA:  83 year old male with a history of right-sided pneumothorax status post thoracostomy EXAM: PORTABLE CHEST 1 VIEW COMPARISON:  02/22/2018, 02/20/2018 FINDINGS: Cardiomediastinal silhouette unchanged in size and contour. Similar appearance of mixed interstitial and airspace opacities in the bilateral mid and lower lungs. No new airspace opacity. Reticular opacities in the periphery with interlobular septal thickening. Unchanged position of right-sided pigtail thoracostomy tube. Likely small residual right apical pneumothorax. IMPRESSION: Likely small residual apical pneumothorax on the right with unchanged position of the thoracostomy tube. Similar appearance of mixed interstitial and airspace disease of the bilateral lungs. Electronically Signed   By: Gilmer Mor D.O.   On: 02/23/2018 08:36   Dg Chest Port 1 View  Result Date: 02/22/2018 CLINICAL DATA:  Right-sided pneumothorax, follow-up exam EXAM: PORTABLE CHEST 1 VIEW COMPARISON:  02/22/2018 FINDINGS: Small bore pigtail catheter is now seen on the right. The right-sided pneumothorax is slightly reduced when compared with the prior exam. Excursion from the apex is approximately 3.6 cm decreased from 4.4 cm. Stable bibasilar changes are noted similar to that noted on the prior exam. No acute bony abnormality is noted. No other focal abnormality is seen. IMPRESSION: Slight reduction in right-sided pneumothorax. Stable bibasilar opacities are noted. Electronically Signed   By: Alcide Clever M.D.   On: 02/22/2018 19:51   Dg Chest Port 1 View  Result Date: 02/13/2018 CLINICAL DATA:  Shortness of breath. EXAM: PORTABLE CHEST 1 VIEW COMPARISON:  Radiograph of February 10, 2018. FINDINGS: Stable cardiomediastinal silhouette. No pneumothorax or pleural  effusion is noted. Slightly improved diffuse interstitial and airspace opacities are noted throughout both lungs suggesting improving infection or edema. Bony thorax is unremarkable. IMPRESSION: Slightly improved bilateral diffuse lung opacities suggesting improving edema or infection. Electronically Signed   By: Lupita Raider, M.D.   On: 02/13/2018 09:22   Dg Chest Port 1 View  Result Date: 02/10/2018 CLINICAL DATA:  Hypoxemia EXAM: PORTABLE CHEST 1 VIEW COMPARISON:  02/09/2018 FINDINGS: Diffuse airspace disease and interstitial prominence throughout the lungs, stable. Mild cardiomegaly. No visible effusions or acute bony abnormality. IMPRESSION: Diffuse interstitial and alveolar opacities throughout the lungs could reflect edema or infection, unchanged. Electronically Signed   By: Charlett Nose M.D.   On: 02/10/2018 08:27   Dg Chest Port 1 View  Result Date: 02/09/2018 CLINICAL DATA:  Shortness of breath.  Hypoxemia. EXAM: PORTABLE CHEST 1 VIEW COMPARISON:  February 08, 2018 FINDINGS: Diffuse interstitial opacities throughout the right lung and the left mid and lower lung. No other interval changes. IMPRESSION: Diffuse pulmonary opacities, right greater than left.  No change. Electronically Signed   By: Gerome Sam  III M.D   On: 02/09/2018 15:48   Dg Chest Port 1 View  Result Date: 02/08/2018 CLINICAL DATA:  Acute respiratory failure.  Hypoxia. EXAM: PORTABLE CHEST 1 VIEW COMPARISON:  01/30/2018 FINDINGS: Cardiac enlargement. Diffuse interstitial pattern throughout the lungs. Previous studies demonstrate an underlying interstitial pattern but changes appear more prominent today. This suggest developing edema or interstitial pneumonitis superimposed upon chronic fibrosis. Blunting of the right costophrenic angle may suggest a small effusion. No pneumothorax. Mediastinal contours appear intact. Calcification of the aorta. IMPRESSION: Increasing diffuse interstitial pattern to the lungs likely  representing developing edema or interstitial pneumonitis superimposed upon chronic fibrosis. Cardiac enlargement. Aortic atherosclerosis. Electronically Signed   By: Burman Nieves M.D.   On: 02/08/2018 22:55   Dg Chest Port 1 View  Result Date: 01/30/2018 CLINICAL DATA:  Bradycardia. EXAM: PORTABLE CHEST 1 VIEW COMPARISON:  Chest radiograph July 15, 2012 FINDINGS: Cardiac silhouette appears moderately enlarged. Mediastinal silhouette is nonsuspicious, calcified aortic knob. Similar chronic interstitial changes without pleural effusion or focal consolidation though the RIGHT costophrenic angle incompletely imaged. No pneumothorax. Soft tissue planes and included osseous structures are nonsuspicious. IMPRESSION: Stable cardiomegaly and chronic interstitial changes. Aortic Atherosclerosis (ICD10-I70.0). Electronically Signed   By: Awilda Metro M.D.   On: 01/30/2018 15:58   Ir Perc Pleural Drain W/indwell Cath W/img Guide  Result Date: 02/22/2018 INDICATION: 83 year old male with a spontaneous right pneumothorax. EXAM: IMAGE GUIDED RIGHT THORACOSTOMY TUBE PLACEMENT MEDICATIONS: The patient is currently admitted to the hospital and receiving intravenous antibiotics. The antibiotics were administered within an appropriate time frame prior to the initiation of the procedure. ANESTHESIA/SEDATION: Fentanyl 50 mcg IV; Versed 0 mg IV NONE. COMPLICATIONS: NONE PROCEDURE: The procedure, risks, benefits, and alternatives were explained to the patient/patient's family, who provided informed consent on the patient's behalf. Specific risks that were addressed included bleeding, infection, ongoing pneumothorax, need for further procedure/surgery, chance of hemorrhage, hemoptysis, cardiopulmonary collapse, death. Questions regarding the procedure were encouraged and answered. The patient understands and consents to the procedure. Patient was positioned in the right anterior oblique position on the IR table and scout  image of the chest was performed for planning purposes. The right mid axillary line at the level of the nipple was identified, and prepped and draped in the usual sterile fashion. The skin and subcutaneous tissues were generously infiltrated 1% lidocaine for local anesthesia. A Yueh needle was then used to enter the pleural space with aspiration of air. The plastic Yueh catheter was advanced into the pleural space and an 035 guidewire was advanced to the apex of the lung under fluoroscopy. Dilation of the skin tract was performed over the wire, and then modified Seldinger technique was used to place a 10 French pigtail catheter into the anterior pleural space. Catheter was attached to water seal chamber and suction was applied confirming a operational chest tube. Retention suture was placed.  Sterile dressing was placed. Patient tolerated the procedure well and remained hemodynamically stable throughout. No complications were encountered and no significant blood loss was encounter IMPRESSION: Status post right thoracostomy tube placed. Signed, Yvone Neu. Loreta Ave, DO [facility] Electronically Signed   By: Gilmer Mor D.O.   On: 02/22/2018 16:27     LOS: 15 days   Jeoffrey Massed, MD  [facility] Pager:[phone]  If 7PM-7AM, please contact night-coverage [email or web address] Password [facility] 02/23/2018, 11:39 AM

## 2018-02-23 NOTE — Progress Notes (Signed)
[place] PCCM Progress Note   BRIEF 83 year old remote smoker with recent NSTEMI and RCA occlusion on cath admitted 4/27 with elevated troponin and acute hypoxic respiratory failure. He was diuresed but  remained severely hypoxic.  He  transitioned off BiPAP  to high flow oxygen , he was started on high-dose steroids  4/29.  02/13/18 and 02/17/18 Results for [facility] (MRN [id number]) as of 02/22/2018 14:58  Ref. Range 02/13/2018 18:18 02/17/2018 11:02  ANA Ab, IFA Unknown  Negative  ANCA Proteinase 3 Latest Ref Range: 0.0 - 3.5 U/mL  <3.5  Anti JO-1 Latest Ref Range: 0.0 - 0.9 AI <0.2   CCP Antibodies IgG/IgA Latest Ref Range: 0 - 19 units 6   Myeloperoxidase Abs Latest Ref Range: 0.0 - 9.0 U/mL  <9.0  RA Latex Turbid. Latest Ref Range: 0.0 - 13.9 IU/mL  <10.0  Cytoplasmic (C-ANCA) Latest Ref Range: Neg:<1:20 titer  <1:20  P-ANCA Latest Ref Range: Neg:<1:20 titer  <1:20  Atypical P-ANCA titer Latest Ref Range: Neg:<1:20 titer  <1:20  ENA SM Ab Ser-aCnc Latest Ref Range: 0.0 - 0.9 AI <0.2   Ribonucleic Protein Latest Ref Range: 0.0 - 0.9 AI <0.2     02/18/18  He is on 10 L high flow nasal cannula this morning.  Denies SOB, chest pain.  Actually states that he feels somewhat better.   SUBJECTIVE/OVERNIGHT/INTERVAL HX 02/23/18 - resting on 2l/m [place] . Says breathing is doing better.  S/p Right PTX 5/11 s/p chest tube by IR .  cXR this am improved with small residual apical ptx.   VITALS Today's Vitals   02/23/18 0729 02/23/18 0830 02/23/18 1011 02/23/18 1129  BP: 125/82 123/77  110/72  Pulse: 88 (!) 46 (!) 105 (!) 101  Resp: (!) 24 (!) 22  (!) 25  Temp:      TempSrc:      SpO2: 91% 91%  96%  Weight:      Height:      PainSc:          General Appearance:    Frail and elderly , on O2   Head:   dry mucosa   Eyes:    Ears:    Nose:   Throat: Clear   Neck:   Supple , no adenopathy      Lungs:     Decreased BS n bases  Right chest tube , no air leak on sxn   Chest wall:    No  deformity   Heart:    RRR no m/r/g   Abdomen:    soft BS +   Genitalia:    Not done  Rectal:   not done  Extremities:  intact w/ no edema      Skin:   Intact in exposed areas .     Neurologic:   Sedation - none -> RASS - na . Moves all 4s - yes. CAM-ICU - neg . Orientation - x 3 +. Deconditioned +    PULMONARY No results for input(s): PHART, PCO2ART, PO2ART, HCO3, TCO2, O2SAT in the last 168 hours.  Invalid input(s): PCO2, PO2  CBC Recent Labs  Lab 02/21/18 0507 02/22/18 0611 02/23/18 0427  HGB 17.7* 16.0 17.2*  HCT 50.8 47.0 49.4  WBC 22.6* 17.0* 15.6*  PLT 264 224 207    COAGULATION No results for input(s): INR in the last 168 hours.  CARDIAC  No results for input(s): TROPONINI in the last 168 hours. No results for input(s): PROBNP in the last  168 hours.   CHEMISTRY Recent Labs  Lab 02/19/18 0523 02/20/18 0459 02/21/18 0507 02/22/18 0611 02/23/18 0427  NA 138 137 136 140 140  K 4.5 4.3 3.8 3.3* 3.8  CL 100* 101 96* 101 101  CO2 GLUCOSE 151* 142* 124* 108* 106*  BUN 50* 46* 44* 42* 42*  CREATININE 1.43* 1.44* 1.48* 1.40* 1.36*  CALCIUM 9.4 9.2 9.5 9.4 9.5   Estimated Creatinine Clearance: 43.2 mL/min (A) (by C-G formula based on SCr of 1.36 mg/dL (H)).   LIVER No results for input(s): AST, ALT, ALKPHOS, BILITOT, PROT, ALBUMIN, INR in the last 168 hours.   INFECTIOUS No results for input(s): LATICACIDVEN, PROCALCITON in the last 168 hours.   ENDOCRINE CBG (last 3)  Recent Labs    02/22/18 2147 02/23/18 0807 02/23/18 1128  GLUCAP 161* 98 150*         IMAGING x48h  - image(s) personally visualized  -   highlighted in bold Dg Chest 2 View  Result Date: 02/22/2018 CLINICAL DATA:  Hypoxia EXAM: CHEST - 2 VIEW COMPARISON:  02/20/2018 FINDINGS: Moderate-sized right pneumothorax measuring 4.4 cm at the apex. Pneumothorax measures approximately 20%. Bilateral interstitial thickening. Bilateral lower lobe airspace disease. Trace  left pleural effusion. Stable cardiomediastinal silhouette. No acute osseous abnormality. IMPRESSION: 1. Moderate-sized right pneumothorax measuring approximately 20%. Critical Value/emergent results were called by telephone at the time of interpretation on 02/22/2018 at 2:16 pm to [facility] , who verbally acknowledged these results. 2. Bilateral lower lobe airspace disease most concerning for pneumonia. Electronically Signed   By: Elige Ko   On: 02/22/2018 14:18   Dg Chest Port 1 View  Result Date: 02/23/2018 CLINICAL DATA:  83 year old male with a history of right-sided pneumothorax status post thoracostomy EXAM: PORTABLE CHEST 1 VIEW COMPARISON:  02/22/2018, 02/20/2018 FINDINGS: Cardiomediastinal silhouette unchanged in size and contour. Similar appearance of mixed interstitial and airspace opacities in the bilateral mid and lower lungs. No new airspace opacity. Reticular opacities in the periphery with interlobular septal thickening. Unchanged position of right-sided pigtail thoracostomy tube. Likely small residual right apical pneumothorax. IMPRESSION: Likely small residual apical pneumothorax on the right with unchanged position of the thoracostomy tube. Similar appearance of mixed interstitial and airspace disease of the bilateral lungs. Electronically Signed   By: Gilmer Mor D.O.   On: 02/23/2018 08:36   Dg Chest Port 1 View  Result Date: 02/22/2018 CLINICAL DATA:  Right-sided pneumothorax, follow-up exam EXAM: PORTABLE CHEST 1 VIEW COMPARISON:  02/22/2018 FINDINGS: Small bore pigtail catheter is now seen on the right. The right-sided pneumothorax is slightly reduced when compared with the prior exam. Excursion from the apex is approximately 3.6 cm decreased from 4.4 cm. Stable bibasilar changes are noted similar to that noted on the prior exam. No acute bony abnormality is noted. No other focal abnormality is seen. IMPRESSION: Slight reduction in right-sided pneumothorax. Stable bibasilar  opacities are noted. Electronically Signed   By: Alcide Clever M.D.   On: 02/22/2018 19:51   Ir Perc Pleural Drain W/indwell Cath W/img Guide  Result Date: 02/22/2018 INDICATION: 83 year old male with a spontaneous right pneumothorax. EXAM: IMAGE GUIDED RIGHT THORACOSTOMY TUBE PLACEMENT MEDICATIONS: The patient is currently admitted to the hospital and receiving intravenous antibiotics. The antibiotics were administered within an appropriate time frame prior to the initiation of the procedure. ANESTHESIA/SEDATION: Fentanyl 50 mcg IV; Versed 0 mg IV NONE. COMPLICATIONS: NONE PROCEDURE: The procedure, risks, benefits, and alternatives were explained to the  patient/patient's family, who provided informed consent on the patient's behalf. Specific risks that were addressed included bleeding, infection, ongoing pneumothorax, need for further procedure/surgery, chance of hemorrhage, hemoptysis, cardiopulmonary collapse, death. Questions regarding the procedure were encouraged and answered. The patient understands and consents to the procedure. Patient was positioned in the right anterior oblique position on the IR table and scout image of the chest was performed for planning purposes. The right mid axillary line at the level of the nipple was identified, and prepped and draped in the usual sterile fashion. The skin and subcutaneous tissues were generously infiltrated 1% lidocaine for local anesthesia. A Yueh needle was then used to enter the pleural space with aspiration of air. The plastic Yueh catheter was advanced into the pleural space and an 035 guidewire was advanced to the apex of the lung under fluoroscopy. Dilation of the skin tract was performed over the wire, and then modified Seldinger technique was used to place a 10 French pigtail catheter into the anterior pleural space. Catheter was attached to water seal chamber and suction was applied confirming a operational chest tube. Retention suture was placed.   Sterile dressing was placed. Patient tolerated the procedure well and remained hemodynamically stable throughout. No complications were encountered and no significant blood loss was encounter IMPRESSION: Status post right thoracostomy tube placed. Signed, Yvone Neu. Loreta Ave, DO [facility] Electronically Signed   By: Gilmer Mor D.O.   On: 02/22/2018 16:27      A NEw issue of spontaneous right pneumothorax - 20% 5/11  S/p chest tube by IR -PTX is stable and decreased on cXR 5/12 .  PLAN Cont on O2 ,  Cont chest tube to sxn  Check cxr in am    Columbia Pandey NP-C  [facility]  [phone]   02/23/2018

## 2018-02-23 NOTE — Progress Notes (Signed)
Referring Physician(s): Dr Jerral Ralph [facility]  Supervising Physician: Gilmer Mor  Patient Status:  [facility] - In-pt  Chief Complaint:  Spontaneous ptx Right chest tube placed in IR 5/11  Subjective:  Up in bed Chest feels heavy Less pain today  CXR this am: IMPRESSION: Likely small residual apical pneumothorax on the right with unchanged position of the thoracostomy tube. Similar appearance of mixed interstitial and airspace disease of the bilateral lungs.  Allergies: Patient has no known allergies.  Medications: Prior to Admission medications   Medication Sig Start Date End Date Taking? Authorizing Provider  allopurinol (ZYLOPRIM) 300 MG tablet Take 300 mg by mouth every evening.    Yes [provider]  amLODipine (NORVASC) 5 MG tablet Take 5 mg by mouth every evening.  04/28/11  Yes [provider]  apixaban (ELIQUIS) 5 MG TABS tablet Take 1 tablet (5 mg total) by mouth 2 (two) times daily. 02/04/18  Yes Georgie Chard D, NP  aspirin EC 81 MG EC tablet Take 1 tablet (81 mg total) by mouth daily. 02/04/18  Yes Georgie Chard D, NP  atorvastatin (LIPITOR) 20 MG tablet Take 1 tablet (20 mg total) by mouth every evening. 02/07/18  Yes Jonelle Sidle, MD  doxazosin (CARDURA) 8 MG tablet Take 8 mg by mouth daily.  04/28/11  Yes [provider]  Emollient (DERMEND BRUISE FORMULA) CREA Apply 1 application topically as needed (dry skin/irritation).    Yes [provider]  furosemide (LASIX) 20 MG tablet Take 20 mg by mouth daily.    Yes [provider]  gemfibrozil (LOPID) 600 MG tablet Take 600 mg by mouth 2 (two) times daily.    Yes [provider]  metoprolol succinate (TOPROL XL) 25 MG 24 hr tablet Take 0.5 tablets (12.5 mg total) by mouth daily. 02/07/18  Yes Jonelle Sidle, MD  sildenafil (REVATIO) 20 MG tablet Take 40-60 mg by mouth daily as needed (unknown use).  12/05/17  Yes [provider]     Vital  Signs: BP 125/82   Pulse 88   Temp 97.8 F (36.6 C) (Oral)   Resp (!) 24   Ht  (1.854 m)   Wt 155 lb 6.8 oz (70.5 kg)   SpO2 91%   BMI 20.51 kg/m   Physical Exam  Constitutional:  In NAD  Pulmonary/Chest: Effort normal. He has wheezes.  Abdominal: Soft.  Neurological: He is alert.  Skin: Skin is warm and dry.  Site of placement is clean and dry NT\ no bleeding  No air leak  Minimal OP in pleurvac/ bloody  Nursing note and vitals reviewed.   Imaging: Dg Chest 2 View  Result Date: 02/22/2018 CLINICAL DATA:  Hypoxia EXAM: CHEST - 2 VIEW COMPARISON:  02/20/2018 FINDINGS: Moderate-sized right pneumothorax measuring 4.4 cm at the apex. Pneumothorax measures approximately 20%. Bilateral interstitial thickening. Bilateral lower lobe airspace disease. Trace left pleural effusion. Stable cardiomediastinal silhouette. No acute osseous abnormality. IMPRESSION: 1. Moderate-sized right pneumothorax measuring approximately 20%. Critical Value/emergent results were called by telephone at the time of interpretation on 02/22/2018 at 2:16 pm to [facility] , who verbally acknowledged these results. 2. Bilateral lower lobe airspace disease most concerning for pneumonia. Electronically Signed   By: Elige Ko   On: 02/22/2018 14:18   Dg Chest 2 View  Result Date: 02/20/2018 CLINICAL DATA:  Shortness of breath, atrial fibrillation, coronary artery disease EXAM: CHEST - 2 VIEW COMPARISON:  02/13/2018 FINDINGS: Upper normal heart size. Atherosclerotic calcification  aorta. Mediastinal contours normal. Asymmetric pulmonary infiltrates RIGHT greater than LEFT question pneumonia versus asymmetric edema. No definite pleural effusion or pneumothorax. Bones demineralized. IMPRESSION: Persistent diffuse BILATERAL pulmonary infiltrates question infection versus asymmetric pulmonary edema, little changed. Electronically Signed   By: Ulyses Southward M.D.   On: 02/20/2018 10:52   Dg Chest Port 1 View  Result  Date: 02/23/2018 CLINICAL DATA:  83 year old male with a history of right-sided pneumothorax status post thoracostomy EXAM: PORTABLE CHEST 1 VIEW COMPARISON:  02/22/2018, 02/20/2018 FINDINGS: Cardiomediastinal silhouette unchanged in size and contour. Similar appearance of mixed interstitial and airspace opacities in the bilateral mid and lower lungs. No new airspace opacity. Reticular opacities in the periphery with interlobular septal thickening. Unchanged position of right-sided pigtail thoracostomy tube. Likely small residual right apical pneumothorax. IMPRESSION: Likely small residual apical pneumothorax on the right with unchanged position of the thoracostomy tube. Similar appearance of mixed interstitial and airspace disease of the bilateral lungs. Electronically Signed   By: Gilmer Mor D.O.   On: 02/23/2018 08:36   Dg Chest Port 1 View  Result Date: 02/22/2018 CLINICAL DATA:  Right-sided pneumothorax, follow-up exam EXAM: PORTABLE CHEST 1 VIEW COMPARISON:  02/22/2018 FINDINGS: Small bore pigtail catheter is now seen on the right. The right-sided pneumothorax is slightly reduced when compared with the prior exam. Excursion from the apex is approximately 3.6 cm decreased from 4.4 cm. Stable bibasilar changes are noted similar to that noted on the prior exam. No acute bony abnormality is noted. No other focal abnormality is seen. IMPRESSION: Slight reduction in right-sided pneumothorax. Stable bibasilar opacities are noted. Electronically Signed   By: Alcide Clever M.D.   On: 02/22/2018 19:51   Ir Perc Pleural Drain W/indwell Cath W/img Guide  Result Date: 02/22/2018 INDICATION: 83 year old male with a spontaneous right pneumothorax. EXAM: IMAGE GUIDED RIGHT THORACOSTOMY TUBE PLACEMENT MEDICATIONS: The patient is currently admitted to the hospital and receiving intravenous antibiotics. The antibiotics were administered within an appropriate time frame prior to the initiation of the procedure.  ANESTHESIA/SEDATION: Fentanyl 50 mcg IV; Versed 0 mg IV NONE. COMPLICATIONS: NONE PROCEDURE: The procedure, risks, benefits, and alternatives were explained to the patient/patient's family, who provided informed consent on the patient's behalf. Specific risks that were addressed included bleeding, infection, ongoing pneumothorax, need for further procedure/surgery, chance of hemorrhage, hemoptysis, cardiopulmonary collapse, death. Questions regarding the procedure were encouraged and answered. The patient understands and consents to the procedure. Patient was positioned in the right anterior oblique position on the IR table and scout image of the chest was performed for planning purposes. The right mid axillary line at the level of the nipple was identified, and prepped and draped in the usual sterile fashion. The skin and subcutaneous tissues were generously infiltrated 1% lidocaine for local anesthesia. A Yueh needle was then used to enter the pleural space with aspiration of air. The plastic Yueh catheter was advanced into the pleural space and an 035 guidewire was advanced to the apex of the lung under fluoroscopy. Dilation of the skin tract was performed over the wire, and then modified Seldinger technique was used to place a 10 French pigtail catheter into the anterior pleural space. Catheter was attached to water seal chamber and suction was applied confirming a operational chest tube. Retention suture was placed.  Sterile dressing was placed. Patient tolerated the procedure well and remained hemodynamically stable throughout. No complications were encountered and no significant blood loss was encounter IMPRESSION: Status post right thoracostomy tube placed. Signed, Marijean Niemann  Kenna Gilbert, DO [facility] Electronically Signed   By: Gilmer Mor D.O.   On: 02/22/2018 16:27    Labs:  CBC: Recent Labs    02/20/18 0459 02/21/18 0507 02/22/18 0611  02/23/18 0427  WBC 16.8* 22.6* 17.0* 15.6*  HGB 16.7 17.7* 16.0 17.2*  HCT 48.9 50.8 47.0 49.4  PLT 241 264 224 207    COAGS: Recent Labs    01/30/18 1800  INR 1.22  APTT 98*    BMP: Recent Labs    02/20/18 0459 02/21/18 0507 02/22/18 0611 02/23/18 0427  NA 137 136 140 140  K 4.3 3.8 3.3* 3.8  CL 101 96* 101 101  CO2 GLUCOSE 142* 124* 108* 106*  BUN 46* 44* 42* 42*  CALCIUM 9.2 9.5 9.4 9.5  CREATININE 1.44* 1.48* 1.40* 1.36*  GFRNONAA 44* 43* 46* 48*  GFRAA 51* 50* 53* 55*    LIVER FUNCTION TESTS: Recent Labs    01/30/18 1536 02/09/18 0449 02/15/18 0452  BILITOT 0.9 1.2 1.1  AST 48* 27 22  ALT ALKPHOS 79 83 87  PROT 6.9 6.1* 6.2*  ALBUMIN 3.5 2.5* 2.7*    Assessment and Plan:  Spontaneous PTX Rt chest tube placed in IR 5/11 Will follow  Electronically Signed: Morrison Mcbryar A, PA-C 02/23/2018, 8:49 AM   I spent a total of 15 Minutes at the the patient's bedside AND on the patient's hospital floor or unit, greater than 50% of which was counseling/coordinating care for right chest tube

## 2018-02-24 ENCOUNTER — Inpatient Hospital Stay (HOSPITAL_COMMUNITY): Payer: Medicare Other

## 2018-02-24 DIAGNOSIS — J939 Pneumothorax, unspecified: Secondary | ICD-10-CM

## 2018-02-24 DIAGNOSIS — R0902 Hypoxemia: Secondary | ICD-10-CM

## 2018-02-24 LAB — CBC
HCT: 49.8 % (ref 39.0–52.0)
HEMOGLOBIN: 17 g/dL (ref 13.0–17.0)
MCH: 34.4 pg — AB (ref 26.0–34.0)
MCHC: 34.1 g/dL (ref 30.0–36.0)
MCV: 100.8 fL — AB (ref 78.0–100.0)
Platelets: 183 10*3/uL (ref 150–400)
RBC: 4.94 MIL/uL (ref 4.22–5.81)
RDW: 14.3 % (ref 11.5–15.5)
WBC: 15.7 10*3/uL — ABNORMAL HIGH (ref 4.0–10.5)

## 2018-02-24 LAB — BASIC METABOLIC PANEL
Anion gap: 9 (ref 5–15)
BUN: 43 mg/dL — AB (ref 6–20)
CHLORIDE: 102 mmol/L (ref 101–111)
CO2: 29 mmol/L (ref 22–32)
Calcium: 9.4 mg/dL (ref 8.9–10.3)
Creatinine, Ser: 1.51 mg/dL — ABNORMAL HIGH (ref 0.61–1.24)
GFR calc Af Amer: 49 mL/min — ABNORMAL LOW (ref >60)
GFR calc non Af Amer: 42 mL/min — ABNORMAL LOW (ref >60)
Glucose, Bld: 96 mg/dL (ref 65–99)
POTASSIUM: 4 mmol/L (ref 3.5–5.1)
Sodium: 140 mmol/L (ref 135–145)

## 2018-02-24 LAB — GLUCOSE, CAPILLARY
GLUCOSE-CAPILLARY: 133 mg/dL — AB (ref 65–99)
GLUCOSE-CAPILLARY: 148 mg/dL — AB (ref 65–99)
Glucose-Capillary: 159 mg/dL — ABNORMAL HIGH (ref 65–99)
Glucose-Capillary: 164 mg/dL — ABNORMAL HIGH (ref 65–99)

## 2018-02-24 MED ORDER — ENSURE ENLIVE PO LIQD
237.0000 mL | Freq: Three times a day (TID) | ORAL | Status: DC
  Administered 2018-02-25 (×2): 237 mL via ORAL

## 2018-02-24 NOTE — Progress Notes (Addendum)
Initial Nutrition Assessment  DOCUMENTATION CODES:   Severe malnutrition in context of chronic illness  INTERVENTION:    Ensure Enlive po TID, each supplement provides 350 kcal and 20 grams of protein  Magic cup TID with meals, each supplement provides 290 kcal and 9 grams of protein  Consider adjusting bowel regimen, patient c/o constipation  NUTRITION DIAGNOSIS:   Severe Malnutrition related to chronic illness(CAD) as evidenced by severe fat depletion, severe muscle depletion, percent weight loss(27% weight loss in 8 months).  GOAL:   Patient will meet greater than or equal to 90% of their needs  MONITOR:   PO intake, Supplement acceptance  REASON FOR ASSESSMENT:   (Request by RN for poor PO intake)    ASSESSMENT:   83 yo male with PMH of HLD, HTN, atherosclerosis, A fib, and arthritis, who was admitted on 4/27 with SOB / acute respiratory failure related to bilateral pulmonary infiltrates, then developed pneumothorax.   Patient c/o poor appetite and constipation. He says he received a stool softener, but it was no help. He has been drinking Ensure supplements 2-3 times per day. He ordered a small lunch today, an orange sherbet and one other item. He agreed to drink Ensure supplements between meals and receive Magic Cups with meals.   Usual weight 200 lbs, now down to 146 lbs. Suspect at least some of the weight loss is related to fluids, although, 200 lbs has been his usual weight for a while. 27% weight loss in 8 months is significant for the time frame.   Labs and medications reviewed. CBG's: [phone]  NUTRITION - FOCUSED PHYSICAL EXAM:    Most Recent Value  Orbital Region  No depletion  Upper Arm Region  Severe depletion  Thoracic and Lumbar Region  Mild depletion  Buccal Region  Moderate depletion  Temple Region  Moderate depletion  Clavicle Bone Region  Severe depletion  Clavicle and Acromion Bone Region  Severe depletion  Scapular Bone Region  Mild  depletion  Dorsal Hand  Mild depletion  Patellar Region  Severe depletion  Anterior Thigh Region  Severe depletion  Posterior Calf Region  Severe depletion  Edema (RD Assessment)  None  Hair  Reviewed  Eyes  Reviewed  Mouth  Reviewed  Skin  Reviewed  Nails  Reviewed       Diet Order:   Diet Order           Diet Heart Room service appropriate? Yes; Fluid consistency: Thin  Diet effective now          EDUCATION NEEDS:   No education needs have been identified at this time  Skin:  Skin Assessment: Skin Integrity Issues: Skin Integrity Issues:: Other (Comment) Other: non-pressure wound to coccyx  Last BM:  5/12  Height:   Ht Readings from Last 1 Encounters:  02/08/18  (1.854 m)    Weight:   Wt Readings from Last 1 Encounters:  02/24/18 146 lb 9.6 oz (66.5 kg)    Ideal Body Weight:  83.6 kg  BMI:  Body mass index is 19.34 kg/m.  Estimated Nutritional Needs:   Kcal:  2100-2300  Protein:  100-115 gm  Fluid:  1.8 L    Joaquin Courts, RD, LDN, CNSC Pager [phone] After Hours Pager [phone]

## 2018-02-24 NOTE — Progress Notes (Signed)
Physical Therapy Treatment Patient Details Name: Craig [name] MRN: [id number] DOB: [birth year] Today's Date: 02/24/2018    History of Present Illness Pt adm to [facility] on 4/27  from [facility] after SOB adn syncopal episode at home. PT with acute hypoxic respiratory failure and on hi flow nasal cannula. Pt went to floor while weighing AM of 5/7. Pt was with recent admit to [facility] with NSTEMI. Developed rt pneumothorax on 5/11 and had chest tube placed. PMH: PAF on eliquis, CAD c recent NSTEMI, CKD3, Lt THA, multiple lumbar spine surgeries. Pt reports "3-4" falls in the last seevral months wherein he describes a LOB and then "wakes up" on the ground unaware of what happened.     PT Comments    Pt refused out of bed due to fatigue. Tolerated LE ex's. Placed pt's bed in chair position.    Follow Up Recommendations  SNF     Equipment Recommendations  None recommended by PT    Recommendations for Other Services       Precautions / Restrictions Precautions Precautions: Fall Restrictions Weight Bearing Restrictions: No    Mobility  Bed Mobility                  Transfers                    Ambulation/Gait                 Stairs             Wheelchair Mobility    Modified Rankin (Stroke Patients Only)       Balance                                            Cognition Arousal/Alertness: Awake/alert Behavior During Therapy: WFL for tasks assessed/performed Overall Cognitive Status: Within Functional Limits for tasks assessed                                        Exercises General Exercises - Lower Extremity Heel Slides: AROM;Both;10 reps;Supine Hip ABduction/ADduction: AROM;Both;10 reps;Supine Straight Leg Raises: AROM;Both;10 reps;Supine    General Comments        Pertinent Vitals/Pain      Home Living                      Prior Function            PT Goals (current goals  can now be found in the care plan section) Progress towards PT goals: Not progressing toward goals - comment    Frequency    Min 2X/week      PT Plan Current plan remains appropriate    Co-evaluation              AM-PAC PT "6 Clicks" Daily Activity  Outcome Measure  Difficulty turning over in bed (including adjusting bedclothes, sheets and blankets)?: A Little Difficulty moving from lying on back to sitting on the side of the bed? : A Little Difficulty sitting down on and standing up from a chair with arms (e.g., wheelchair, bedside commode, etc,.)?: Unable Help needed moving to and from a bed to chair (including a wheelchair)?: A Little Help needed walking in hospital room?: A Little Help needed climbing  3-5 steps with a railing? : Total 6 Click Score: 14    End of Session Equipment Utilized During Treatment: Oxygen Activity Tolerance: Patient limited by fatigue Patient left: in bed;with call bell/phone within reach(Bed in chair position) Nurse Communication: Mobility status PT Visit Diagnosis: History of falling (Z91.81);Difficulty in walking, not elsewhere classified (R26.2);Unsteadiness on feet (R26.81)     Time: 1610-9604 PT Time Calculation (min) (ACUTE ONLY): 15 min  Charges:  $Therapeutic Exercise: 8-22 mins                    G Codes:       [facility] PT [phone]    Angelina Ok [facility] 02/24/2018, 3:54 PM

## 2018-02-24 NOTE — Progress Notes (Addendum)
[facility] Progress Note   BRIEF 83 year old remote smoker with recent NSTEMI and RCA occlusion on cath admitted 4/27 with elevated troponin and acute hypoxic respiratory failure. He was diuresed but  remained severely hypoxic.  He  transitioned off BiPAP  to high flow oxygen , he was started on high-dose steroids  4/29.  02/13/18 and 02/17/18 Results for [facility] (MRN [id number]) as of 02/22/2018 14:58  Ref. Range 02/13/2018 18:18 02/17/2018 11:02  ANA Ab, IFA Unknown  Negative  ANCA Proteinase 3 Latest Ref Range: 0.0 - 3.5 U/mL  <3.5  Anti JO-1 Latest Ref Range: 0.0 - 0.9 AI <0.2   CCP Antibodies IgG/IgA Latest Ref Range: 0 - 19 units 6   Myeloperoxidase Abs Latest Ref Range: 0.0 - 9.0 U/mL  <9.0  RA Latex Turbid. Latest Ref Range: 0.0 - 13.9 IU/mL  <10.0  Cytoplasmic (C-ANCA) Latest Ref Range: Neg:<1:20 titer  <1:20  P-ANCA Latest Ref Range: Neg:<1:20 titer  <1:20  Atypical P-ANCA titer Latest Ref Range: Neg:<1:20 titer  <1:20  ENA SM Ab Ser-aCnc Latest Ref Range: 0.0 - 0.9 AI <0.2   Ribonucleic Protein Latest Ref Range: 0.0 - 0.9 AI <0.2     02/18/18  He is on 10 L high flow nasal cannula this morning.  Denies SOB, chest pain.  Actually states that he feels somewhat better. 5/11  S/p Right PTX 5/11 s/p chest tube by IR .  02/23/18 - resting on 2l/m [place] . Says breathing is doing better.  02/24/2018 - CT to waterseal  SUBJECTIVE:  RN reports poor appetite and not wanting to get OOB as much Patient denies SOB at rest, has ongoing DOE Currently on 2L [place]  OBJECTIVE:  General:  Thin, pleasant elderly male sitting in bed in NAD HEENT: MM pink/moist, pupils equal/ round Neuro: AOx 3, MAE, generalized weakness CV: IRIR, no m/r/g PULM: even/non-labored, lungs bilaterally clear, bibasilar rales, R pigtail CT currently to 20cm suction, no airleak, minimal drainage GI: soft, non-tender, bs active  Extremities: warm/dry, no edema  Skin: no rashes, bilateral bruising to arms  PULMONARY No  results for input(s): PHART, PCO2ART, PO2ART, HCO3, TCO2, O2SAT in the last 168 hours.  Invalid input(s): PCO2, PO2  CBC Recent Labs  Lab 02/22/18 0611 02/23/18 0427 02/24/18 0746  HGB 16.0 17.2* 17.0  HCT 47.0 49.4 49.8  WBC 17.0* 15.6* 15.7*  PLT 224 207 183    COAGULATION No results for input(s): INR in the last 168 hours.  CARDIAC  No results for input(s): TROPONINI in the last 168 hours. No results for input(s): PROBNP in the last 168 hours.   CHEMISTRY Recent Labs  Lab 02/20/18 0459 02/21/18 0507 02/22/18 0611 02/23/18 0427 02/24/18 0746  NA 137 136 140 140 140  K 4.3 3.8 3.3* 3.8 4.0  CL 101 96* 101 101 102  CO2 GLUCOSE 142* 124* 108* 106* 96  BUN 46* 44* 42* 42* 43*  CREATININE 1.44* 1.48* 1.40* 1.36* 1.51*  CALCIUM 9.2 9.5 9.4 9.5 9.4   Estimated Creatinine Clearance: 36.7 mL/min (A) (by C-G formula based on SCr of 1.51 mg/dL (H)).   LIVER No results for input(s): AST, ALT, ALKPHOS, BILITOT, PROT, ALBUMIN, INR in the last 168 hours.   INFECTIOUS No results for input(s): LATICACIDVEN, PROCALCITON in the last 168 hours.   ENDOCRINE CBG (last 3)  Recent Labs    02/23/18 2112 02/24/18 0817 02/24/18 1216  GLUCAP 134* 133* 159*    IMAGING  x42h  - image(s) personally visualized  -   highlighted in bold Dg Chest Port 1 View  Result Date: 02/24/2018 CLINICAL DATA:  Shortness of breath EXAM: PORTABLE CHEST 1 VIEW COMPARISON:  02/23/2018 FINDINGS: Cardiac shadow is within normal limits. Right-sided chest tube is again identified. The right pneumothorax has decreased in size when compare with the prior exam. Diffuse opacities are again identified and stable bilaterally. No new focal abnormality is seen. IMPRESSION: Stable bilateral infiltrates. Reduction in right pneumothorax. Electronically Signed   By: Alcide Clever M.D.   On: 02/24/2018 07:34   Dg Chest Port 1 View  Result Date: 02/23/2018 CLINICAL DATA:  83 year old male status post  spontaneous right pneumothorax and chest tube placement. EXAM: PORTABLE CHEST 1 VIEW COMPARISON:  0714 hours today and earlier. FINDINGS: Portable AP upright view at 1401 hours. Stable right chest pigtail type chest tube in place. Residual small right apical pneumothorax is stable since yesterday. Continued Patchy and confluent multifocal bilateral pulmonary opacity mostly about the hila. No pulmonary edema suspected. No definite pleural effusion. Stable cardiac size and mediastinal contours. Calcified aortic atherosclerosis. IMPRESSION: 1. Stable right chest tube and residual right apical pneumothorax. 2. Bilateral pneumonia suspected with no significant change since 02/20/2018. Electronically Signed   By: Odessa Fleming M.D.   On: 02/23/2018 14:14   Dg Chest Port 1 View  Result Date: 02/23/2018 CLINICAL DATA:  83 year old male with a history of right-sided pneumothorax status post thoracostomy EXAM: PORTABLE CHEST 1 VIEW COMPARISON:  02/22/2018, 02/20/2018 FINDINGS: Cardiomediastinal silhouette unchanged in size and contour. Similar appearance of mixed interstitial and airspace opacities in the bilateral mid and lower lungs. No new airspace opacity. Reticular opacities in the periphery with interlobular septal thickening. Unchanged position of right-sided pigtail thoracostomy tube. Likely small residual right apical pneumothorax. IMPRESSION: Likely small residual apical pneumothorax on the right with unchanged position of the thoracostomy tube. Similar appearance of mixed interstitial and airspace disease of the bilateral lungs. Electronically Signed   By: Gilmer Mor D.O.   On: 02/23/2018 08:36   Dg Chest Port 1 View  Result Date: 02/22/2018 CLINICAL DATA:  Right-sided pneumothorax, follow-up exam EXAM: PORTABLE CHEST 1 VIEW COMPARISON:  02/22/2018 FINDINGS: Small bore pigtail catheter is now seen on the right. The right-sided pneumothorax is slightly reduced when compared with the prior exam. Excursion from  the apex is approximately 3.6 cm decreased from 4.4 cm. Stable bibasilar changes are noted similar to that noted on the prior exam. No acute bony abnormality is noted. No other focal abnormality is seen. IMPRESSION: Slight reduction in right-sided pneumothorax. Stable bibasilar opacities are noted. Electronically Signed   By: Alcide Clever M.D.   On: 02/22/2018 19:51   Ir Perc Pleural Drain W/indwell Cath W/img Guide  Result Date: 02/22/2018 INDICATION: 83 year old male with a spontaneous right pneumothorax. EXAM: IMAGE GUIDED RIGHT THORACOSTOMY TUBE PLACEMENT MEDICATIONS: The patient is currently admitted to the hospital and receiving intravenous antibiotics. The antibiotics were administered within an appropriate time frame prior to the initiation of the procedure. ANESTHESIA/SEDATION: Fentanyl 50 mcg IV; Versed 0 mg IV NONE. COMPLICATIONS: NONE PROCEDURE: The procedure, risks, benefits, and alternatives were explained to the patient/patient's family, who provided informed consent on the patient's behalf. Specific risks that were addressed included bleeding, infection, ongoing pneumothorax, need for further procedure/surgery, chance of hemorrhage, hemoptysis, cardiopulmonary collapse, death. Questions regarding the procedure were encouraged and answered. The patient understands and consents to the procedure. Patient was positioned in the right anterior oblique position  on the IR table and scout image of the chest was performed for planning purposes. The right mid axillary line at the level of the nipple was identified, and prepped and draped in the usual sterile fashion. The skin and subcutaneous tissues were generously infiltrated 1% lidocaine for local anesthesia. A Yueh needle was then used to enter the pleural space with aspiration of air. The plastic Yueh catheter was advanced into the pleural space and an 035 guidewire was advanced to the apex of the lung under fluoroscopy. Dilation of the skin tract was  performed over the wire, and then modified Seldinger technique was used to place a 10 French pigtail catheter into the anterior pleural space. Catheter was attached to water seal chamber and suction was applied confirming a operational chest tube. Retention suture was placed.  Sterile dressing was placed. Patient tolerated the procedure well and remained hemodynamically stable throughout. No complications were encountered and no significant blood loss was encounter IMPRESSION: Status post right thoracostomy tube placed. Signed, Yvone Neu. Loreta Ave, DO [facility] Radiology Electronically Signed   By: Gilmer Mor D.O.   On: 02/22/2018 16:27   Assessment  Hypoxic respiratory failure Bilateral infiltrates w/concern for ILD New issue of spontaneous right pneumothorax on 5/11 at 20% S/p chest tube by IR -PTX is stable and decreased on cXR 5/12 and 5/13.   - autoimmune workup negative   PLAN Cont on O2 , titrate for sats 88-92% Cont chest tube to water seal 5/13- IR following Repeat CXR in am, PTX may be slow to resolve Check cxr in am, or sooner if develops chest pain or SOB  Continue prednisone 50 mg daily Keep even/ dry I/O balance Will need further outpt followup for ILD testing Continue PT and nutrition efforts with dietary  Posey Boyer, AGACNP-BC [facility] Pgr: [phone] or if no answer [phone] 02/24/2018, 3:05 PM  Attending Note:  83 year old male with ILD history who is not O2 dependent at home presenting with SOB and was found to have pulmonary edema after a NSTEMI.  Patient developed a spontaneous PTX on the right and IR placed a CT.  Autoimmune work up all negative.  Vitals:   02/24/18 1216 02/24/18 1357  BP: 128/73   Pulse: 90   Resp: (!) 23   Temp: 97.8 F (36.6 C)   SpO2: 98% 99%   General: Chronically ill appearing male, NAD on 2L [place] HEENT: [place]/AT, PERRL, EOM-I and MMM Neuro: alert and  interactive, moving all ext to command Heart: RRR, Nl S1/S2 and -M/R/G Lungs: diffuse crackles at the bases, dry Abdomen: Soft, NT, ND and +BS Ext: -edema and tenderness Skin: Intact  CBC    Component Value Date/Time   WBC 15.7 (H) 02/24/2018 0746   RBC 4.94 02/24/2018 0746   HGB 17.0 02/24/2018 0746   HCT 49.8 02/24/2018 0746   PLT 183 02/24/2018 0746   MCV 100.8 (H) 02/24/2018 0746   MCH 34.4 (H) 02/24/2018 0746   MCHC 34.1 02/24/2018 0746   RDW 14.3 02/24/2018 0746   LYMPHSABS 1.2 02/21/2018 0507   MONOABS 0.5 02/21/2018 0507   EOSABS 0.0 02/21/2018 0507   BASOSABS 0.0 02/21/2018 0507   BMET    Component Value Date/Time   NA 140 02/24/2018 0746   K 4.0 02/24/2018 0746   CL 102 02/24/2018 0746   CO2 29 02/24/2018 0746   GLUCOSE 96 02/24/2018 0746   BUN 43 (H) 02/24/2018 0746  CREATININE 1.51 (H) 02/24/2018 0746   CALCIUM 9.4 02/24/2018 0746   GFRNONAA 42 (L) 02/24/2018 0746   GFRAA 49 (L) 02/24/2018 0746   A/P  ILD:  - Finish a week of prednisone 50 (started on 5/10) then begin taper over a 2 week period  - Will need apt with PCCM as outpatient when closer to discharge  Hypoxemia:  - Titrate O2 for sat of 88-92%  - Will need arrangement for home O2 upon discharge  PTX: no air leak  - Change CT to water seal today  - CXR in AM, if no change then will remove CT  PCCM will continue to follow.  Patient seen and examined, agree with above note.  I dictated the care and orders written for this patient under my direction.  Alyson Reedy, MD [phone]

## 2018-02-24 NOTE — Progress Notes (Signed)
CSW continuing to follow and will support with disposition planning pending medical readiness. Updated patient's chosen SNF, [facility] on patient status.  Abigail Butts, LCSWA [phone]

## 2018-02-24 NOTE — Progress Notes (Signed)
Progress Note  Patient Name: Craig [name] Date of Encounter: 02/24/2018  Primary Cardiologist: Nona Dell, MD   Subjective   Complains of dyspnea with activities; no chest pain  Inpatient Medications    Scheduled Meds: . allopurinol  300 mg Oral QPM  . apixaban  5 mg Oral BID  . aspirin EC  81 mg Oral Daily  . atorvastatin  20 mg Oral q1800  . cefdinir  300 mg Oral Q12H  . furosemide  40 mg Oral Daily  . gemfibrozil  600 mg Oral BID AC  . guaiFENesin  600 mg Oral BID  . insulin aspart  0-9 Units Subcutaneous TID WC  . ipratropium  0.5 mg Nebulization TID  . levalbuterol  1.25 mg Nebulization TID  . liver oil-zinc oxide   Topical q morning - 10a  . metoprolol succinate  37.5 mg Oral Daily  . nystatin  5 mL Oral QID  . predniSONE  50 mg Oral Q breakfast  . senna-docusate  1 tablet Oral BID  . sodium chloride flush  3 mL Intravenous Q12H  . tamsulosin  0.4 mg Oral QPC supper   Continuous Infusions: . sodium chloride 250 mL (02/19/18 1437)   PRN Meds: sodium chloride, acetaminophen, ALPRAZolam, artificial tears, levalbuterol, magnesium hydroxide, menthol-cetylpyridinium, ondansetron (ZOFRAN) IV, phenol, sodium chloride, sodium chloride flush   Vital Signs    Vitals:   02/23/18 2118 02/23/18 2349 02/24/18 0541 02/24/18 0743  BP: 113/60 128/76 132/81   Pulse: 89 87 88   Resp: (!) 24 (!) 23 19   Temp: (!) 97.5 F (36.4 C) (!) 97.3 F (36.3 C) (!) 97.4 F (36.3 C)   TempSrc: Axillary Oral Oral   SpO2: 97% 92% 93% 97%  Weight:   146 lb 9.6 oz (66.5 kg)   Height:        Intake/Output Summary (Last 24 hours) at 02/24/2018 0754 Last data filed at 02/24/2018 0500 Gross per 24 hour  Intake 240 ml  Output 920 ml  Net -680 ml   Filed Weights   02/22/18 0445 02/23/18 0458 02/24/18 0541  Weight: 155 lb 3.3 oz (70.4 kg) 155 lb 6.8 oz (70.5 kg) 146 lb 9.6 oz (66.5 kg)    Telemetry    Atrial fibrillation with PVCs/5beats NSVT vs aberrancy- Personally  Reviewed   Physical Exam   GEN: No acute distress.   Neck: No JVD Cardiac: irregular Respiratory: Basilar crackles; chest tube in place GI: Soft, nontender, non-distended  MS: No edema Neuro:  Nonfocal  Psych: Normal affect   Labs    Chemistry Recent Labs  Lab 02/21/18 0507 02/22/18 0611 02/23/18 0427  NA 136 140 140  K 3.8 3.3* 3.8  CL 96* 101 101  CO2 GLUCOSE 124* 108* 106*  BUN 44* 42* 42*  CREATININE 1.48* 1.40* 1.36*  CALCIUM 9.5 9.4 9.5  GFRNONAA 43* 46* 48*  GFRAA 50* 53* 55*  ANIONGAP Hematology Recent Labs  Lab 02/21/18 0507 02/22/18 0611 02/23/18 0427  WBC 22.6* 17.0* 15.6*  RBC 5.05 4.69 4.89  HGB 17.7* 16.0 17.2*  HCT 50.8 47.0 49.4  MCV 100.6* 100.2* 101.0*  MCH 35.0* 34.1* 35.2*  MCHC 34.8 34.0 34.8  RDW 14.0 14.0 14.2  PLT 264 224 207     BNP Recent Labs  Lab 02/18/18 0441  BNP 425.5*     Radiology    Dg Chest 2 View  Result Date: 02/22/2018 CLINICAL DATA:  Hypoxia EXAM: CHEST - 2 VIEW COMPARISON:  02/20/2018 FINDINGS: Moderate-sized right pneumothorax measuring 4.4 cm at the apex. Pneumothorax measures approximately 20%. Bilateral interstitial thickening. Bilateral lower lobe airspace disease. Trace left pleural effusion. Stable cardiomediastinal silhouette. No acute osseous abnormality. IMPRESSION: 1. Moderate-sized right pneumothorax measuring approximately 20%. Critical Value/emergent results were called by telephone at the time of interpretation on 02/22/2018 at 2:16 pm to [facility] , who verbally acknowledged these results. 2. Bilateral lower lobe airspace disease most concerning for pneumonia. Electronically Signed   By: Elige Ko   On: 02/22/2018 14:18   Dg Chest Port 1 View  Result Date: 02/24/2018 CLINICAL DATA:  Shortness of breath EXAM: PORTABLE CHEST 1 VIEW COMPARISON:  02/23/2018 FINDINGS: Cardiac shadow is within normal limits. Right-sided chest tube is again identified. The right pneumothorax  has decreased in size when compare with the prior exam. Diffuse opacities are again identified and stable bilaterally. No new focal abnormality is seen. IMPRESSION: Stable bilateral infiltrates. Reduction in right pneumothorax. Electronically Signed   By: Alcide Clever M.D.   On: 02/24/2018 07:34   Dg Chest Port 1 View  Result Date: 02/23/2018 CLINICAL DATA:  83 year old male status post spontaneous right pneumothorax and chest tube placement. EXAM: PORTABLE CHEST 1 VIEW COMPARISON:  0714 hours today and earlier. FINDINGS: Portable AP upright view at 1401 hours. Stable right chest pigtail type chest tube in place. Residual small right apical pneumothorax is stable since yesterday. Continued Patchy and confluent multifocal bilateral pulmonary opacity mostly about the hila. No pulmonary edema suspected. No definite pleural effusion. Stable cardiac size and mediastinal contours. Calcified aortic atherosclerosis. IMPRESSION: 1. Stable right chest tube and residual right apical pneumothorax. 2. Bilateral pneumonia suspected with no significant change since 02/20/2018. Electronically Signed   By: Odessa Fleming M.D.   On: 02/23/2018 14:14   Dg Chest Port 1 View  Result Date: 02/23/2018 CLINICAL DATA:  83 year old male with a history of right-sided pneumothorax status post thoracostomy EXAM: PORTABLE CHEST 1 VIEW COMPARISON:  02/22/2018, 02/20/2018 FINDINGS: Cardiomediastinal silhouette unchanged in size and contour. Similar appearance of mixed interstitial and airspace opacities in the bilateral mid and lower lungs. No new airspace opacity. Reticular opacities in the periphery with interlobular septal thickening. Unchanged position of right-sided pigtail thoracostomy tube. Likely small residual right apical pneumothorax. IMPRESSION: Likely small residual apical pneumothorax on the right with unchanged position of the thoracostomy tube. Similar appearance of mixed interstitial and airspace disease of the bilateral lungs.  Electronically Signed   By: Gilmer Mor D.O.   On: 02/23/2018 08:36   Dg Chest Port 1 View  Result Date: 02/22/2018 CLINICAL DATA:  Right-sided pneumothorax, follow-up exam EXAM: PORTABLE CHEST 1 VIEW COMPARISON:  02/22/2018 FINDINGS: Small bore pigtail catheter is now seen on the right. The right-sided pneumothorax is slightly reduced when compared with the prior exam. Excursion from the apex is approximately 3.6 cm decreased from 4.4 cm. Stable bibasilar changes are noted similar to that noted on the prior exam. No acute bony abnormality is noted. No other focal abnormality is seen. IMPRESSION: Slight reduction in right-sided pneumothorax. Stable bibasilar opacities are noted. Electronically Signed   By: Alcide Clever M.D.   On: 02/22/2018 19:51   Ir Perc Pleural Drain W/indwell Cath W/img Guide  Result Date: 02/22/2018 INDICATION: 83 year old male with a spontaneous right pneumothorax. EXAM: IMAGE GUIDED RIGHT THORACOSTOMY TUBE PLACEMENT MEDICATIONS: The patient is currently admitted to the hospital and receiving intravenous antibiotics. The antibiotics were administered within an appropriate  time frame prior to the initiation of the procedure. ANESTHESIA/SEDATION: Fentanyl 50 mcg IV; Versed 0 mg IV NONE. COMPLICATIONS: NONE PROCEDURE: The procedure, risks, benefits, and alternatives were explained to the patient/patient's family, who provided informed consent on the patient's behalf. Specific risks that were addressed included bleeding, infection, ongoing pneumothorax, need for further procedure/surgery, chance of hemorrhage, hemoptysis, cardiopulmonary collapse, death. Questions regarding the procedure were encouraged and answered. The patient understands and consents to the procedure. Patient was positioned in the right anterior oblique position on the IR table and scout image of the chest was performed for planning purposes. The right mid axillary line at the level of the nipple was identified, and  prepped and draped in the usual sterile fashion. The skin and subcutaneous tissues were generously infiltrated 1% lidocaine for local anesthesia. A Yueh needle was then used to enter the pleural space with aspiration of air. The plastic Yueh catheter was advanced into the pleural space and an 035 guidewire was advanced to the apex of the lung under fluoroscopy. Dilation of the skin tract was performed over the wire, and then modified Seldinger technique was used to place a 10 French pigtail catheter into the anterior pleural space. Catheter was attached to water seal chamber and suction was applied confirming a operational chest tube. Retention suture was placed.  Sterile dressing was placed. Patient tolerated the procedure well and remained hemodynamically stable throughout. No complications were encountered and no significant blood loss was encounter IMPRESSION: Status post right thoracostomy tube placed. Signed, Yvone Neu. Loreta Ave, DO [facility] Radiology Electronically Signed   By: Gilmer Mor D.O.   On: 02/22/2018 16:27    Patient Profile     83 y.o. male with PMH of CAD s/p recent NSTEMI 01/31/18 (RCA occlusion medically managed), permanent atrial fibrillation, CKD stage III, who presented with syncope and found to have acute hypoxic respiratory failure (CHF exacerbation vs aspiration). Echo with contrast on this admission is consistent with apical variant hypertrophic cardiomyopathy.  Assessment & Plan    1 acute hypoxic respiratory failure-etiology appears to be inflammatory lung disease.  Continue steroids and pulmonary toilet.  Pulmonary following.  We will continue with gentle diuresis although patient does not appear to be volume overloaded on examination.    2 pneumothorax-chest tube in place.  3 permanent atrial fibrillation-continue metoprolol for rate control.  Continue apixaban at present dose.  4 apical variant hypertrophic  cardiomyopathy-continue beta-blocker at present dose.  5 coronary artery disease status post recent non-ST elevation myocardial infarction-cardiac catheterization revealed occluded RCA managed medically.  Continue aspirin, statin and beta-blocker.  6 chronic stage III kidney disease-follow BUN and CR.  For questions or updates, please contact [facility] Please consult [email or web address] for contact info under Cardiology/STEMI.      Signed, Olga Millers, MD  02/24/2018, 7:54 AM

## 2018-02-24 NOTE — Progress Notes (Signed)
Referring Physician(s): Dr. Elvera Lennox  Supervising Physician: Irish Lack  Patient Status:  [facility] - In-pt  Chief Complaint: R PTX  Subjective: Patient resting comfortably. Right chest tube remains in place.  Denies soreness.   Allergies: Patient has no known allergies.  Medications: Prior to Admission medications   Medication Sig Start Date End Date Taking? Authorizing Provider  allopurinol (ZYLOPRIM) 300 MG tablet Take 300 mg by mouth every evening.    Yes [provider]  amLODipine (NORVASC) 5 MG tablet Take 5 mg by mouth every evening.  04/28/11  Yes [provider]  apixaban (ELIQUIS) 5 MG TABS tablet Take 1 tablet (5 mg total) by mouth 2 (two) times daily. 02/04/18  Yes Georgie Chard D, NP  aspirin EC 81 MG EC tablet Take 1 tablet (81 mg total) by mouth daily. 02/04/18  Yes Georgie Chard D, NP  atorvastatin (LIPITOR) 20 MG tablet Take 1 tablet (20 mg total) by mouth every evening. 02/07/18  Yes Jonelle Sidle, MD  doxazosin (CARDURA) 8 MG tablet Take 8 mg by mouth daily.  04/28/11  Yes [provider]  Emollient (DERMEND BRUISE FORMULA) CREA Apply 1 application topically as needed (dry skin/irritation).    Yes [provider]  furosemide (LASIX) 20 MG tablet Take 20 mg by mouth daily.    Yes [provider]  gemfibrozil (LOPID) 600 MG tablet Take 600 mg by mouth 2 (two) times daily.    Yes [provider]  metoprolol succinate (TOPROL XL) 25 MG 24 hr tablet Take 0.5 tablets (12.5 mg total) by mouth daily. 02/07/18  Yes Jonelle Sidle, MD  sildenafil (REVATIO) 20 MG tablet Take 40-60 mg by mouth daily as needed (unknown use).  12/05/17  Yes [provider]     Vital Signs: BP (!) 110/56 (BP Location: Left Wrist)   Pulse (!) 104   Temp (!) 97.5 F (36.4 C) (Axillary)   Resp (!) 24   Ht  (1.854 m)   Wt 146 lb 9.6 oz (66.5 kg)   SpO2 97%   BMI 19.34 kg/m   Physical Exam  Constitutional: He is  oriented to person, place, and time. He appears well-developed.  Cardiovascular: Normal rate, regular rhythm and normal heart sounds.  Pulmonary/Chest: Effort normal and breath sounds normal. No respiratory distress.  Chest tube in place.  Breath sounds bilaterally. Small amount of serosanguinous output in pleurvac.   Neurological: He is alert and oriented to person, place, and time.  Nursing note and vitals reviewed.   Imaging: Dg Chest 2 View  Result Date: 02/22/2018 CLINICAL DATA:  Hypoxia EXAM: CHEST - 2 VIEW COMPARISON:  02/20/2018 FINDINGS: Moderate-sized right pneumothorax measuring 4.4 cm at the apex. Pneumothorax measures approximately 20%. Bilateral interstitial thickening. Bilateral lower lobe airspace disease. Trace left pleural effusion. Stable cardiomediastinal silhouette. No acute osseous abnormality. IMPRESSION: 1. Moderate-sized right pneumothorax measuring approximately 20%. Critical Value/emergent results were called by telephone at the time of interpretation on 02/22/2018 at 2:16 pm to [facility] , who verbally acknowledged these results. 2. Bilateral lower lobe airspace disease most concerning for pneumonia. Electronically Signed   By: Elige Ko   On: 02/22/2018 14:18   Dg Chest Port 1 View  Result Date: 02/24/2018 CLINICAL DATA:  Shortness of breath EXAM: PORTABLE CHEST 1 VIEW COMPARISON:  02/23/2018 FINDINGS: Cardiac shadow is within normal limits. Right-sided chest tube is again identified. The right pneumothorax has decreased in size when compare with the prior exam. Diffuse  opacities are again identified and stable bilaterally. No new focal abnormality is seen. IMPRESSION: Stable bilateral infiltrates. Reduction in right pneumothorax. Electronically Signed   By: Alcide Clever M.D.   On: 02/24/2018 07:34   Dg Chest Port 1 View  Result Date: 02/23/2018 CLINICAL DATA:  83 year old male status post spontaneous right pneumothorax and chest tube placement. EXAM: PORTABLE  CHEST 1 VIEW COMPARISON:  0714 hours today and earlier. FINDINGS: Portable AP upright view at 1401 hours. Stable right chest pigtail type chest tube in place. Residual small right apical pneumothorax is stable since yesterday. Continued Patchy and confluent multifocal bilateral pulmonary opacity mostly about the hila. No pulmonary edema suspected. No definite pleural effusion. Stable cardiac size and mediastinal contours. Calcified aortic atherosclerosis. IMPRESSION: 1. Stable right chest tube and residual right apical pneumothorax. 2. Bilateral pneumonia suspected with no significant change since 02/20/2018. Electronically Signed   By: Odessa Fleming M.D.   On: 02/23/2018 14:14   Dg Chest Port 1 View  Result Date: 02/23/2018 CLINICAL DATA:  83 year old male with a history of right-sided pneumothorax status post thoracostomy EXAM: PORTABLE CHEST 1 VIEW COMPARISON:  02/22/2018, 02/20/2018 FINDINGS: Cardiomediastinal silhouette unchanged in size and contour. Similar appearance of mixed interstitial and airspace opacities in the bilateral mid and lower lungs. No new airspace opacity. Reticular opacities in the periphery with interlobular septal thickening. Unchanged position of right-sided pigtail thoracostomy tube. Likely small residual right apical pneumothorax. IMPRESSION: Likely small residual apical pneumothorax on the right with unchanged position of the thoracostomy tube. Similar appearance of mixed interstitial and airspace disease of the bilateral lungs. Electronically Signed   By: Gilmer Mor D.O.   On: 02/23/2018 08:36   Dg Chest Port 1 View  Result Date: 02/22/2018 CLINICAL DATA:  Right-sided pneumothorax, follow-up exam EXAM: PORTABLE CHEST 1 VIEW COMPARISON:  02/22/2018 FINDINGS: Small bore pigtail catheter is now seen on the right. The right-sided pneumothorax is slightly reduced when compared with the prior exam. Excursion from the apex is approximately 3.6 cm decreased from 4.4 cm. Stable bibasilar  changes are noted similar to that noted on the prior exam. No acute bony abnormality is noted. No other focal abnormality is seen. IMPRESSION: Slight reduction in right-sided pneumothorax. Stable bibasilar opacities are noted. Electronically Signed   By: Alcide Clever M.D.   On: 02/22/2018 19:51   Ir Perc Pleural Drain W/indwell Cath W/img Guide  Result Date: 02/22/2018 INDICATION: 83 year old male with a spontaneous right pneumothorax. EXAM: IMAGE GUIDED RIGHT THORACOSTOMY TUBE PLACEMENT MEDICATIONS: The patient is currently admitted to the hospital and receiving intravenous antibiotics. The antibiotics were administered within an appropriate time frame prior to the initiation of the procedure. ANESTHESIA/SEDATION: Fentanyl 50 mcg IV; Versed 0 mg IV NONE. COMPLICATIONS: NONE PROCEDURE: The procedure, risks, benefits, and alternatives were explained to the patient/patient's family, who provided informed consent on the patient's behalf. Specific risks that were addressed included bleeding, infection, ongoing pneumothorax, need for further procedure/surgery, chance of hemorrhage, hemoptysis, cardiopulmonary collapse, death. Questions regarding the procedure were encouraged and answered. The patient understands and consents to the procedure. Patient was positioned in the right anterior oblique position on the IR table and scout image of the chest was performed for planning purposes. The right mid axillary line at the level of the nipple was identified, and prepped and draped in the usual sterile fashion. The skin and subcutaneous tissues were generously infiltrated 1% lidocaine for local anesthesia. A Yueh needle was then used to enter the pleural space with aspiration  of air. The plastic Yueh catheter was advanced into the pleural space and an 035 guidewire was advanced to the apex of the lung under fluoroscopy. Dilation of the skin tract was performed over the wire, and then modified Seldinger technique was used  to place a 10 French pigtail catheter into the anterior pleural space. Catheter was attached to water seal chamber and suction was applied confirming a operational chest tube. Retention suture was placed.  Sterile dressing was placed. Patient tolerated the procedure well and remained hemodynamically stable throughout. No complications were encountered and no significant blood loss was encounter IMPRESSION: Status post right thoracostomy tube placed. Signed, Yvone Neu. Loreta Ave, DO [facility] Electronically Signed   By: Gilmer Mor D.O.   On: 02/22/2018 16:27    Labs:  CBC: Recent Labs    02/21/18 0507 02/22/18 0611 02/23/18 0427 02/24/18 0746  WBC 22.6* 17.0* 15.6* 15.7*  HGB 17.7* 16.0 17.2* 17.0  HCT 50.8 47.0 49.4 49.8  PLT 264 224 207 183    COAGS: Recent Labs    01/30/18 1800  INR 1.22  APTT 98*    BMP: Recent Labs    02/21/18 0507 02/22/18 0611 02/23/18 0427 02/24/18 0746  NA 136 140 140 140  K 3.8 3.3* 3.8 4.0  CL 96* 101 101 102  CO2 GLUCOSE 124* 108* 106* 96  BUN 44* 42* 42* 43*  CALCIUM 9.5 9.4 9.5 9.4  CREATININE 1.48* 1.40* 1.36* 1.51*  GFRNONAA 43* 46* 48* 42*  GFRAA 50* 53* 55* 49*    LIVER FUNCTION TESTS: Recent Labs    01/30/18 1536 02/09/18 0449 02/15/18 0452  BILITOT 0.9 1.2 1.1  AST 48* 27 22  ALT ALKPHOS 79 83 87  PROT 6.9 6.1* 6.2*  ALBUMIN 3.5 2.5* 2.7*    Assessment and Plan: Pneumothorax s/p chest tube placement 5/11 Chest tube remains in place.  CXR notes improvement since placement, but not complete resolve.  Per PCCM note, ILD may slow progress or prevent complete resolve.  IR following.   Electronically Signed: Hoyt Koch, PA 02/24/2018, 11:23 AM   I spent a total of 15 Minutes at the the patient's bedside AND on the patient's hospital floor or unit, greater than 50% of which was counseling/coordinating care for  pneumothorax.

## 2018-02-24 NOTE — Progress Notes (Signed)
PROGRESS NOTE        PATIENT DETAILS Name: Craig [name] Age: 83 y.o. Sex: male Date of Birth: 06-Oct-1935 Admit Date: 02/08/2018 Admitting Physician Briscoe Deutscher, MD BJY:NWGN, Angelina Pih, MD  Brief Narrative: Patient is a 83 y.o. male with prior history of A. fib on anticoagulation, CAD-recent cardiac catheterization on 4/19 showed chronically occluded RCA-on medical management-admitted with shortness of breath, found to have acute hypoxic respiratory failure secondary to persistent bilateral pulmonary infiltrates-probably inflammatory in etiology.  Hospital course complicated by development of pneumothorax.  See below for further details  Subjective: No major issues overnight-still has chest tube in place-pulmonology and IR following.  Assessment/Plan: Acute hypoxic respiratory failure: Initially thought to be decompensated diastolic heart failure-however after he even adequate diuresis-continues to have persistent bilateral infiltrates, now thought to have interstitial lung disease.  Autoimmune work-up negative-plans are to slowly taper down steroids-and continue to keep patient in a negative balance.  Further work-up for ILD will be deferred to the outpatient setting when patient has recovered from acute illness and is less deconditioned.  Suspect will need oxygen on discharge.  Spontaneous right-sided pneumothorax: Underwent right-sided chest tube placement by IR on 5/11-chest tube under waterseal drainage-continue care per IR/pulmonology.  Acute on chronic diastolic heart failure: Compensated-continue Lasix.  Cardiology following.  Leukocytosis: Secondary to steroids-no indication of infection at this time-continue to monitor off all antimicrobial agents.    Oral thrush: Continue nystatin  CAD: No anginal symptoms-shortness of breath above due to above noted reasons.  Recent LHC on 4/19 showed chronic RCA occlusion with recommendations for medical management.   Continue aspirin, statin and metoprolol.  Chronic atrial fibrillation: Rate controlled with metoprolol-remains on Eliquis.   Apical variant hypertrophic cardiomyopathy: Continue beta-blocker-continue to monitor on telemetry  BPH: Continue Flomax  Gout: No evidence of flare-follow  Deconditioning/debility: Secondary to acute illness-SNF on discharge.  DVT Prophylaxis: Full dose anticoagulation with Eliquis  Code Status: Full code  Family Communication: None at bedside  Disposition Plan: Remain inpatient- SNF on discharge-likely over the next 2-3 days-once chest tube has been removed.  Antimicrobial agents: Anti-infectives (From admission, onward)   Start     Dose/Rate Route Frequency Ordered Stop   02/22/18 1000  cefdinir (OMNICEF) capsule 300 mg     300 mg Oral Every 12 hours 02/21/18 1854 02/28/18 0959   02/19/18 1330  cefTRIAXone (ROCEPHIN) 1 g in sodium chloride 0.9 % 100 mL IVPB  Status:  Discontinued     1 g 200 mL/hr over 30 Minutes Intravenous Every 24 hours 02/19/18 1233 02/21/18 1854      Procedures: 5/12>> right-sided chest tube placement by IR  CONSULTS:  cardiology, pulmonary/intensive care and IR  Time spent: 35 minutes-Greater than 50% of this time was spent in counseling, explanation of diagnosis, planning of further management, and coordination of care.  MEDICATIONS: Scheduled Meds: . allopurinol  300 mg Oral QPM  . apixaban  5 mg Oral BID  . aspirin EC  81 mg Oral Daily  . atorvastatin  20 mg Oral q1800  . cefdinir  300 mg Oral Q12H  . feeding supplement (ENSURE ENLIVE)  237 mL Oral TID BM  . furosemide  40 mg Oral Daily  . gemfibrozil  600 mg Oral BID AC  . guaiFENesin  600 mg Oral BID  . insulin aspart  0-9 Units Subcutaneous  TID WC  . ipratropium  0.5 mg Nebulization TID  . levalbuterol  1.25 mg Nebulization TID  . liver oil-zinc oxide   Topical q morning - 10a  . metoprolol succinate  37.5 mg Oral Daily  . nystatin  5 mL Oral QID  .  predniSONE  50 mg Oral Q breakfast  . senna-docusate  1 tablet Oral BID  . sodium chloride flush  3 mL Intravenous Q12H  . tamsulosin  0.4 mg Oral QPC supper   Continuous Infusions: . sodium chloride 250 mL (02/19/18 1437)   PRN Meds:.sodium chloride, acetaminophen, ALPRAZolam, artificial tears, levalbuterol, magnesium hydroxide, menthol-cetylpyridinium, ondansetron (ZOFRAN) IV, phenol, sodium chloride, sodium chloride flush   PHYSICAL EXAM: Vital signs: Vitals:   02/24/18 0743 02/24/18 0819 02/24/18 0839 02/24/18 1216  BP:  (!) 110/56  128/73  Pulse:  99 (!) 104 90  Resp:  (!) 24  (!) 23  Temp:  (!) 97.5 F (36.4 C)  97.8 F (36.6 C)  TempSrc:  Axillary  Axillary  SpO2: 97%   98%  Weight:      Height:       Filed Weights   02/22/18 0445 02/23/18 0458 02/24/18 0541  Weight: 70.4 kg (155 lb 3.3 oz) 70.5 kg (155 lb 6.8 oz) 66.5 kg (146 lb 9.6 oz)   Body mass index is 19.34 kg/m.   General appearance :Awake, alert, not in any distress.  HEENT: Atraumatic and Normocephalic Neck: supple, no JVD. Resp:Good air entry bilaterally, few rales heard scattered. CVS: S1 S2 regular, no murmurs.  GI: Bowel sounds present, Non tender and not distended with no gaurding, rigidity or rebound. Extremities: B/L Lower Ext shows no edema, both legs are warm to touch Neurology:  speech clear,Non focal, sensation is grossly intact. Psychiatric: Normal judgment and insight. Normal mood. Musculoskeletal:No digital cyanosis Skin:No Rash, warm and dry Wounds:N/A  I have personally reviewed following labs and imaging studies  LABORATORY DATA: CBC: Recent Labs  Lab 02/20/18 0459 02/21/18 0507 02/22/18 0611 02/23/18 0427 02/24/18 0746  WBC 16.8* 22.6* 17.0* 15.6* 15.7*  NEUTROABS 15.5* 20.8*  --   --   --   HGB 16.7 17.7* 16.0 17.2* 17.0  HCT 48.9 50.8 47.0 49.4 49.8  MCV 100.2* 100.6* 100.2* 101.0* 100.8*  PLT 241 264 224 207 183    Basic Metabolic Panel: Recent Labs  Lab  02/20/18 0459 02/21/18 0507 02/22/18 0611 02/23/18 0427 02/24/18 0746  NA 137 136 140 140 140  K 4.3 3.8 3.3* 3.8 4.0  CL 101 96* 101 101 102  CO2 26 27 27 27 29   GLUCOSE 142* 124* 108* 106* 96  BUN 46* 44* 42* 42* 43*  CREATININE 1.44* 1.48* 1.40* 1.36* 1.51*  CALCIUM 9.2 9.5 9.4 9.5 9.4    GFR: Estimated Creatinine Clearance: 36.7 mL/min (A) (by C-G formula based on SCr of 1.51 mg/dL (H)).  Liver Function Tests: No results for input(s): AST, ALT, ALKPHOS, BILITOT, PROT, ALBUMIN in the last 168 hours. No results for input(s): LIPASE, AMYLASE in the last 168 hours. No results for input(s): AMMONIA in the last 168 hours.  Coagulation Profile: No results for input(s): INR, PROTIME in the last 168 hours.  Cardiac Enzymes: No results for input(s): CKTOTAL, CKMB, CKMBINDEX, TROPONINI in the last 168 hours.  BNP (last 3 results) No results for input(s): PROBNP in the last 8760 hours.  HbA1C: No results for input(s): HGBA1C in the last 72 hours.  CBG: Recent Labs  Lab 02/23/18 1128 02/23/18 1626  02/23/18 2112 02/24/18 0817 02/24/18 1216  GLUCAP 150* 180* 134* 133* 159*    Lipid Profile: No results for input(s): CHOL, HDL, LDLCALC, TRIG, CHOLHDL, LDLDIRECT in the last 72 hours.  Thyroid Function Tests: No results for input(s): TSH, T4TOTAL, FREET4, T3FREE, THYROIDAB in the last 72 hours.  Anemia Panel: No results for input(s): VITAMINB12, FOLATE, FERRITIN, TIBC, IRON, RETICCTPCT in the last 72 hours.  Urine analysis:    Component Value Date/Time   COLORURINE YELLOW 02/22/2011 0905   APPEARANCEUR CLEAR 02/22/2011 0905   LABSPEC 1.010 02/22/2011 0905   PHURINE 6.5 02/22/2011 0905   GLUCOSEU NEGATIVE 02/22/2011 0905   HGBUR NEGATIVE 02/22/2011 0905   BILIRUBINUR NEGATIVE 02/22/2011 0905   KETONESUR NEGATIVE 02/22/2011 0905   PROTEINUR NEGATIVE 02/22/2011 0905   UROBILINOGEN 0.2 02/22/2011 0905   NITRITE NEGATIVE 02/22/2011 0905   LEUKOCYTESUR  02/22/2011  0905    NEGATIVE MICROSCOPIC NOT DONE ON URINES WITH NEGATIVE PROTEIN, BLOOD, LEUKOCYTES, NITRITE, OR GLUCOSE <1000 mg/dL.    Sepsis Labs: Lactic Acid, Venous    Component Value Date/Time   LATICACIDVEN 1.7 02/08/2018 2224    MICROBIOLOGY: No results found for this or any previous visit (from the past 240 hour(s)).  RADIOLOGY STUDIES/RESULTS: Dg Chest 2 View  Result Date: 02/22/2018 CLINICAL DATA:  Hypoxia EXAM: CHEST - 2 VIEW COMPARISON:  02/20/2018 FINDINGS: Moderate-sized right pneumothorax measuring 4.4 cm at the apex. Pneumothorax measures approximately 20%. Bilateral interstitial thickening. Bilateral lower lobe airspace disease. Trace left pleural effusion. Stable cardiomediastinal silhouette. No acute osseous abnormality. IMPRESSION: 1. Moderate-sized right pneumothorax measuring approximately 20%. Critical Value/emergent results were called by telephone at the time of interpretation on 02/22/2018 at 2:16 pm to [facility] , who verbally acknowledged these results. 2. Bilateral lower lobe airspace disease most concerning for pneumonia. Electronically Signed   By: Elige Ko   On: 02/22/2018 14:18   Dg Chest 2 View  Result Date: 02/20/2018 CLINICAL DATA:  Shortness of breath, atrial fibrillation, coronary artery disease EXAM: CHEST - 2 VIEW COMPARISON:  02/13/2018 FINDINGS: Upper normal heart size. Atherosclerotic calcification aorta. Mediastinal contours normal. Asymmetric pulmonary infiltrates RIGHT greater than LEFT question pneumonia versus asymmetric edema. No definite pleural effusion or pneumothorax. Bones demineralized. IMPRESSION: Persistent diffuse BILATERAL pulmonary infiltrates question infection versus asymmetric pulmonary edema, little changed. Electronically Signed   By: Ulyses Southward M.D.   On: 02/20/2018 10:52   Ct Chest Wo Contrast  Result Date: 02/10/2018 CLINICAL DATA:  Interstitial lung disease. EXAM: CT CHEST WITHOUT CONTRAST TECHNIQUE: Multidetector CT imaging of  the chest was performed following the standard protocol without IV contrast. COMPARISON:  Multiple chest x-rays since July 15, 2012 FINDINGS: Cardiovascular: Atherosclerotic change in the nonaneurysmal aorta. The central pulmonary arteries are normal in caliber. Coronary artery calcifications are noted. Mild cardiomegaly. Mediastinum/Nodes: Small right pleural effusion. No pericardial effusion. The esophagus and thyroid is normal. No adenopathy in the chest. Lungs/Pleura: Central airways are normal. No pneumothorax. There are some emphysematous changes in the lungs. Focal pulmonary infiltrates are seen bilaterally. The right infiltrates are most prominent in the right perihilar region, involving the upper and lower lobe. The infiltrate on the left is most prominent in the left lower lobe and posteriorly in the left upper lobe. There are some ground-glass opacities as well. Evaluation for nodules or masses is limited due to the diffuse opacities. Upper Abdomen: No acute abnormality. Musculoskeletal: There is an age-indeterminate compression fracture of T10, new since 2013. IMPRESSION: 1. Bilateral diffuse pulmonary infiltrates involving all  lobes as above. There was significant worsening between January 30, 2018 and February 08, 2018. Given the rapid onset, the infiltrates are most likely infectious or inflammatory. There may be a mild interstitial background component but the interstitial component is not well assessed due to the diffuse infiltrates. Recommend follow-up to resolution. 2. There is a T10 compression fracture new since 2013 but otherwise age indeterminate. 3. Atherosclerotic change in the nonaneurysmal aorta. Coronary artery calcifications. 4. Emphysema. Aortic Atherosclerosis (ICD10-I70.0) and Emphysema (ICD10-J43.9). Electronically Signed   By: Gerome Sam III M.D   On: 02/10/2018 21:13   Dg Chest Port 1 View  Result Date: 02/24/2018 CLINICAL DATA:  Shortness of breath EXAM: PORTABLE CHEST 1  VIEW COMPARISON:  02/23/2018 FINDINGS: Cardiac shadow is within normal limits. Right-sided chest tube is again identified. The right pneumothorax has decreased in size when compare with the prior exam. Diffuse opacities are again identified and stable bilaterally. No new focal abnormality is seen. IMPRESSION: Stable bilateral infiltrates. Reduction in right pneumothorax. Electronically Signed   By: Alcide Clever M.D.   On: 02/24/2018 07:34   Dg Chest Port 1 View  Result Date: 02/23/2018 CLINICAL DATA:  83 year old male status post spontaneous right pneumothorax and chest tube placement. EXAM: PORTABLE CHEST 1 VIEW COMPARISON:  0714 hours today and earlier. FINDINGS: Portable AP upright view at 1401 hours. Stable right chest pigtail type chest tube in place. Residual small right apical pneumothorax is stable since yesterday. Continued Patchy and confluent multifocal bilateral pulmonary opacity mostly about the hila. No pulmonary edema suspected. No definite pleural effusion. Stable cardiac size and mediastinal contours. Calcified aortic atherosclerosis. IMPRESSION: 1. Stable right chest tube and residual right apical pneumothorax. 2. Bilateral pneumonia suspected with no significant change since 02/20/2018. Electronically Signed   By: Odessa Fleming M.D.   On: 02/23/2018 14:14   Dg Chest Port 1 View  Result Date: 02/23/2018 CLINICAL DATA:  83 year old male with a history of right-sided pneumothorax status post thoracostomy EXAM: PORTABLE CHEST 1 VIEW COMPARISON:  02/22/2018, 02/20/2018 FINDINGS: Cardiomediastinal silhouette unchanged in size and contour. Similar appearance of mixed interstitial and airspace opacities in the bilateral mid and lower lungs. No new airspace opacity. Reticular opacities in the periphery with interlobular septal thickening. Unchanged position of right-sided pigtail thoracostomy tube. Likely small residual right apical pneumothorax. IMPRESSION: Likely small residual apical pneumothorax on  the right with unchanged position of the thoracostomy tube. Similar appearance of mixed interstitial and airspace disease of the bilateral lungs. Electronically Signed   By: Gilmer Mor D.O.   On: 02/23/2018 08:36   Dg Chest Port 1 View  Result Date: 02/22/2018 CLINICAL DATA:  Right-sided pneumothorax, follow-up exam EXAM: PORTABLE CHEST 1 VIEW COMPARISON:  02/22/2018 FINDINGS: Small bore pigtail catheter is now seen on the right. The right-sided pneumothorax is slightly reduced when compared with the prior exam. Excursion from the apex is approximately 3.6 cm decreased from 4.4 cm. Stable bibasilar changes are noted similar to that noted on the prior exam. No acute bony abnormality is noted. No other focal abnormality is seen. IMPRESSION: Slight reduction in right-sided pneumothorax. Stable bibasilar opacities are noted. Electronically Signed   By: Alcide Clever M.D.   On: 02/22/2018 19:51   Dg Chest Port 1 View  Result Date: 02/13/2018 CLINICAL DATA:  Shortness of breath. EXAM: PORTABLE CHEST 1 VIEW COMPARISON:  Radiograph of February 10, 2018. FINDINGS: Stable cardiomediastinal silhouette. No pneumothorax or pleural effusion is noted. Slightly improved diffuse interstitial and airspace opacities are noted  throughout both lungs suggesting improving infection or edema. Bony thorax is unremarkable. IMPRESSION: Slightly improved bilateral diffuse lung opacities suggesting improving edema or infection. Electronically Signed   By: Lupita Raider, M.D.   On: 02/13/2018 09:22   Dg Chest Port 1 View  Result Date: 02/10/2018 CLINICAL DATA:  Hypoxemia EXAM: PORTABLE CHEST 1 VIEW COMPARISON:  02/09/2018 FINDINGS: Diffuse airspace disease and interstitial prominence throughout the lungs, stable. Mild cardiomegaly. No visible effusions or acute bony abnormality. IMPRESSION: Diffuse interstitial and alveolar opacities throughout the lungs could reflect edema or infection, unchanged. Electronically Signed   By: Charlett Nose M.D.   On: 02/10/2018 08:27   Dg Chest Port 1 View  Result Date: 02/09/2018 CLINICAL DATA:  Shortness of breath.  Hypoxemia. EXAM: PORTABLE CHEST 1 VIEW COMPARISON:  February 08, 2018 FINDINGS: Diffuse interstitial opacities throughout the right lung and the left mid and lower lung. No other interval changes. IMPRESSION: Diffuse pulmonary opacities, right greater than left.  No change. Electronically Signed   By: Gerome Sam III M.D   On: 02/09/2018 15:48   Dg Chest Port 1 View  Result Date: 02/08/2018 CLINICAL DATA:  Acute respiratory failure.  Hypoxia. EXAM: PORTABLE CHEST 1 VIEW COMPARISON:  01/30/2018 FINDINGS: Cardiac enlargement. Diffuse interstitial pattern throughout the lungs. Previous studies demonstrate an underlying interstitial pattern but changes appear more prominent today. This suggest developing edema or interstitial pneumonitis superimposed upon chronic fibrosis. Blunting of the right costophrenic angle may suggest a small effusion. No pneumothorax. Mediastinal contours appear intact. Calcification of the aorta. IMPRESSION: Increasing diffuse interstitial pattern to the lungs likely representing developing edema or interstitial pneumonitis superimposed upon chronic fibrosis. Cardiac enlargement. Aortic atherosclerosis. Electronically Signed   By: Burman Nieves M.D.   On: 02/08/2018 22:55   Dg Chest Port 1 View  Result Date: 01/30/2018 CLINICAL DATA:  Bradycardia. EXAM: PORTABLE CHEST 1 VIEW COMPARISON:  Chest radiograph July 15, 2012 FINDINGS: Cardiac silhouette appears moderately enlarged. Mediastinal silhouette is nonsuspicious, calcified aortic knob. Similar chronic interstitial changes without pleural effusion or focal consolidation though the RIGHT costophrenic angle incompletely imaged. No pneumothorax. Soft tissue planes and included osseous structures are nonsuspicious. IMPRESSION: Stable cardiomegaly and chronic interstitial changes. Aortic Atherosclerosis  (ICD10-I70.0). Electronically Signed   By: Awilda Metro M.D.   On: 01/30/2018 15:58   Ir Perc Pleural Drain W/indwell Cath W/img Guide  Result Date: 02/22/2018 INDICATION: 83 year old male with a spontaneous right pneumothorax. EXAM: IMAGE GUIDED RIGHT THORACOSTOMY TUBE PLACEMENT MEDICATIONS: The patient is currently admitted to the hospital and receiving intravenous antibiotics. The antibiotics were administered within an appropriate time frame prior to the initiation of the procedure. ANESTHESIA/SEDATION: Fentanyl 50 mcg IV; Versed 0 mg IV NONE. COMPLICATIONS: NONE PROCEDURE: The procedure, risks, benefits, and alternatives were explained to the patient/patient's family, who provided informed consent on the patient's behalf. Specific risks that were addressed included bleeding, infection, ongoing pneumothorax, need for further procedure/surgery, chance of hemorrhage, hemoptysis, cardiopulmonary collapse, death. Questions regarding the procedure were encouraged and answered. The patient understands and consents to the procedure. Patient was positioned in the right anterior oblique position on the IR table and scout image of the chest was performed for planning purposes. The right mid axillary line at the level of the nipple was identified, and prepped and draped in the usual sterile fashion. The skin and subcutaneous tissues were generously infiltrated 1% lidocaine for local anesthesia. A Yueh needle was then used to enter the pleural space with aspiration of air. The  plastic Yueh catheter was advanced into the pleural space and an 035 guidewire was advanced to the apex of the lung under fluoroscopy. Dilation of the skin tract was performed over the wire, and then modified Seldinger technique was used to place a 10 French pigtail catheter into the anterior pleural space. Catheter was attached to water seal chamber and suction was applied confirming a operational chest tube. Retention suture was placed.   Sterile dressing was placed. Patient tolerated the procedure well and remained hemodynamically stable throughout. No complications were encountered and no significant blood loss was encounter IMPRESSION: Status post right thoracostomy tube placed. Signed, Yvone Neu. Loreta Ave, DO [facility] Radiology Electronically Signed   By: Gilmer Mor D.O.   On: 02/22/2018 16:27     LOS: 16 days   Jeoffrey Massed, MD  [facility] Pager:[phone]  If 7PM-7AM, please contact night-coverage [email or web address] Password [email or web address] 02/24/2018, 1:41 PM

## 2018-02-25 ENCOUNTER — Inpatient Hospital Stay (HOSPITAL_COMMUNITY): Payer: Medicare Other

## 2018-02-25 DIAGNOSIS — J9311 Primary spontaneous pneumothorax: Secondary | ICD-10-CM

## 2018-02-25 LAB — GLUCOSE, CAPILLARY
GLUCOSE-CAPILLARY: 110 mg/dL — AB (ref 65–99)
GLUCOSE-CAPILLARY: 228 mg/dL — AB (ref 65–99)
GLUCOSE-CAPILLARY: 125 mg/dL — AB (ref 65–99)

## 2018-02-25 MED ORDER — ZOLPIDEM TARTRATE 5 MG PO TABS
5.0000 mg | ORAL_TABLET | Freq: Every evening | ORAL | Status: DC | PRN
  Administered 2018-02-25: 5 mg via ORAL
  Filled 2018-02-25: qty 1

## 2018-02-25 NOTE — Progress Notes (Addendum)
PROGRESS NOTE        PATIENT DETAILS Name: Craig [name] Age: 83 y.o. Sex: male Date of Birth: [birth year] Admit Date: 02/08/2018 Admitting Physician Briscoe Deutscher, MD WUJ:[id number], Angelina Pih, MD  Brief Narrative: Patient is a 83 y.o. male with prior history of A. fib on anticoagulation, CAD-recent cardiac catheterization on 4/19 showed chronically occluded RCA-on medical management-admitted with shortness of breath, found to have acute hypoxic respiratory failure secondary to persistent bilateral pulmonary infiltrates-probably inflammatory in etiology.  Hospital course complicated by development of pneumothorax.  See below for further details  Subjective: No major issues-no chest pain-shortness of breath is slowly improving.  Assessment/Plan: Acute hypoxic respiratory failure: Initially thought to be decompensated diastolic heart failure, but after adequate diuresis-he continued to have persistent interstitial changes-he is now thought to have interstitial lung disease.  Autoimmune panel negative.  Remains on steroids-we will begin a 2-week taper on discharge.  When he has recovered from this acute illness and debility-he will require more work-up in the outpatient setting with pulmonology.Hypoxemia has improved significantly-previously was on high flow O2, currently he is on just 2-3 L of oxygen.  He will likely require oxygen on discharge.  Spontaneous right-sided pneumothorax: Underwent right-sided chest tube placement by IR on 5/11-chest tube removed on 5/14-plans are to repeat chest x-ray tomorrow.   Acute on chronic diastolic heart failure: Compensated-continue Lasix.  Continue to try to keep in negative balance.  Cardiology following.    Leukocytosis: Secondary to steroids-no indication of infection at this time-continue to monitor off all antimicrobial agents.    Oral thrush: Continue nystatin.  CAD: No anginal symptoms-shortness of breath above due to above  noted reasons.  Recent LHC on 4/19 showed chronic RCA occlusion with recommendations for medical management.  Continue aspirin, statin and metoprolol.  Chronic atrial fibrillation: Rate controlled with metoprolol-remains on Eliquis.   Apical variant hypertrophic cardiomyopathy: Continue beta-blocker-continue to monitor closely on telemetry.  BPH: Continue Flomax.   Gout: No evidence of flare-follow  Deconditioning/debility: Secondary to acute illness-SNF on discharge.  DVT Prophylaxis: Full dose anticoagulation with Eliquis  Code Status: Full code  Family Communication: None at bedside  Disposition Plan: Remain inpatient- SNF on discharge-likely over the next 2-3 days-once chest tube has been removed.  Antimicrobial agents: Anti-infectives (From admission, onward)   Start     Dose/Rate Route Frequency Ordered Stop   02/22/18 1000  cefdinir (OMNICEF) capsule 300 mg     300 mg Oral Every 12 hours 02/21/18 1854 02/28/18 0959   02/19/18 1330  cefTRIAXone (ROCEPHIN) 1 g in sodium chloride 0.9 % 100 mL IVPB  Status:  Discontinued     1 g 200 mL/hr over 30 Minutes Intravenous Every 24 hours 02/19/18 1233 02/21/18 1854      Procedures: 5/12>> right-sided chest tube placement by IR  CONSULTS:  cardiology, pulmonary/intensive care and IR  Time spent: 25 minutes-Greater than 50% of this time was spent in counseling, explanation of diagnosis, planning of further management, and coordination of care.  MEDICATIONS: Scheduled Meds: . allopurinol  300 mg Oral QPM  . apixaban  5 mg Oral BID  . aspirin EC  81 mg Oral Daily  . atorvastatin  20 mg Oral q1800  . cefdinir  300 mg Oral Q12H  . feeding supplement (ENSURE ENLIVE)  237 mL Oral TID BM  . furosemide  40 mg Oral Daily  . gemfibrozil  600 mg Oral BID AC  . guaiFENesin  600 mg Oral BID  . insulin aspart  0-9 Units Subcutaneous TID WC  . ipratropium  0.5 mg Nebulization TID  . levalbuterol  1.25 mg Nebulization TID  . liver  oil-zinc oxide   Topical q morning - 10a  . metoprolol succinate  37.5 mg Oral Daily  . nystatin  5 mL Oral QID  . predniSONE  50 mg Oral Q breakfast  . senna-docusate  1 tablet Oral BID  . sodium chloride flush  3 mL Intravenous Q12H  . tamsulosin  0.4 mg Oral QPC supper   Continuous Infusions: . sodium chloride 250 mL (02/19/18 1437)   PRN Meds:.sodium chloride, acetaminophen, ALPRAZolam, artificial tears, levalbuterol, magnesium hydroxide, menthol-cetylpyridinium, ondansetron (ZOFRAN) IV, phenol, sodium chloride, sodium chloride flush   PHYSICAL EXAM: Vital signs: Vitals:   02/25/18 0456 02/25/18 0815 02/25/18 0842 02/25/18 1136  BP: 133/88  112/60 111/68  Pulse: 78  100 (!) 105  Resp: 16  (!) 30 (!) 22  Temp:   97.6 F (36.4 C) 98 F (36.7 C)  TempSrc:   Axillary Oral  SpO2: 93% 94% 93% 93%  Weight: 74.8 kg (164 lb 14.4 oz)     Height:       Filed Weights   02/23/18 0458 02/24/18 0541 02/25/18 0456  Weight: 70.5 kg (155 lb 6.8 oz) 66.5 kg (146 lb 9.6 oz) 74.8 kg (164 lb 14.4 oz)   Body mass index is 21.76 kg/m.   General appearance :Awake, alert, not in any distress.  Eyes:, pupils equally reactive to light and accomodation,no scleral icterus. HEENT: Atraumatic and Normocephalic Neck: supple, no JVD. Resp:Good air entry bilaterally, no rales or rhonchi CVS: S1 S2 regular, no murmurs.  GI: Bowel sounds present, Non tender and not distended with no gaurding, rigidity or rebound. Extremities: B/L Lower Ext shows no edema, both legs are warm to touch Neurology:  speech clear,Non focal, sensation is grossly intact.   I have personally reviewed following labs and imaging studies  LABORATORY DATA: CBC: Recent Labs  Lab 02/20/18 0459 02/21/18 0507 02/22/18 0611 02/23/18 0427 02/24/18 0746  WBC 16.8* 22.6* 17.0* 15.6* 15.7*  NEUTROABS 15.5* 20.8*  --   --   --   HGB 16.7 17.7* 16.0 17.2* 17.0  HCT 48.9 50.8 47.0 49.4 49.8  MCV 100.2* 100.6* 100.2* 101.0*  100.8*  PLT 241 264 224 207 183    Basic Metabolic Panel: Recent Labs  Lab 02/20/18 0459 02/21/18 0507 02/22/18 0611 02/23/18 0427 02/24/18 0746  NA 137 136 140 140 140  K 4.3 3.8 3.3* 3.8 4.0  CL 101 96* 101 101 102  CO2 GLUCOSE 142* 124* 108* 106* 96  BUN 46* 44* 42* 42* 43*  CREATININE 1.44* 1.48* 1.40* 1.36* 1.51*  CALCIUM 9.2 9.5 9.4 9.5 9.4    GFR: Estimated Creatinine Clearance: 41.3 mL/min (A) (by C-G formula based on SCr of 1.51 mg/dL (H)).  Liver Function Tests: No results for input(s): AST, ALT, ALKPHOS, BILITOT, PROT, ALBUMIN in the last 168 hours. No results for input(s): LIPASE, AMYLASE in the last 168 hours. No results for input(s): AMMONIA in the last 168 hours.  Coagulation Profile: No results for input(s): INR, PROTIME in the last 168 hours.  Cardiac Enzymes: No results for input(s): CKTOTAL, CKMB, CKMBINDEX, TROPONINI in the last 168 hours.  BNP (last 3 results) No results for input(s): PROBNP  in the last 8760 hours.  HbA1C: No results for input(s): HGBA1C in the last 72 hours.  CBG: Recent Labs  Lab 02/24/18 0817 02/24/18 1216 02/24/18 1653 02/24/18 2115 02/25/18 1140  GLUCAP 133* 159* 164* 148* 110*    Lipid Profile: No results for input(s): CHOL, HDL, LDLCALC, TRIG, CHOLHDL, LDLDIRECT in the last 72 hours.  Thyroid Function Tests: No results for input(s): TSH, T4TOTAL, FREET4, T3FREE, THYROIDAB in the last 72 hours.  Anemia Panel: No results for input(s): VITAMINB12, FOLATE, FERRITIN, TIBC, IRON, RETICCTPCT in the last 72 hours.  Urine analysis:    Component Value Date/Time   COLORURINE YELLOW 02/22/2011 0905   APPEARANCEUR CLEAR 02/22/2011 0905   LABSPEC 1.010 02/22/2011 0905   PHURINE 6.5 02/22/2011 0905   GLUCOSEU NEGATIVE 02/22/2011 0905   HGBUR NEGATIVE 02/22/2011 0905   BILIRUBINUR NEGATIVE 02/22/2011 0905   KETONESUR NEGATIVE 02/22/2011 0905   PROTEINUR NEGATIVE 02/22/2011 0905   UROBILINOGEN 0.2  02/22/2011 0905   NITRITE NEGATIVE 02/22/2011 0905   LEUKOCYTESUR  02/22/2011 0905    NEGATIVE MICROSCOPIC NOT DONE ON URINES WITH NEGATIVE PROTEIN, BLOOD, LEUKOCYTES, NITRITE, OR GLUCOSE <1000 mg/dL.    Sepsis Labs: Lactic Acid, Venous    Component Value Date/Time   LATICACIDVEN 1.7 02/08/2018 2224    MICROBIOLOGY: No results found for this or any previous visit (from the past 240 hour(s)).  RADIOLOGY STUDIES/RESULTS: Dg Chest 2 View  Result Date: 02/22/2018 CLINICAL DATA:  Hypoxia EXAM: CHEST - 2 VIEW COMPARISON:  02/20/2018 FINDINGS: Moderate-sized right pneumothorax measuring 4.4 cm at the apex. Pneumothorax measures approximately 20%. Bilateral interstitial thickening. Bilateral lower lobe airspace disease. Trace left pleural effusion. Stable cardiomediastinal silhouette. No acute osseous abnormality. IMPRESSION: 1. Moderate-sized right pneumothorax measuring approximately 20%. Critical Value/emergent results were called by telephone at the time of interpretation on 02/22/2018 at 2:16 pm to [facility] , who verbally acknowledged these results. 2. Bilateral lower lobe airspace disease most concerning for pneumonia. Electronically Signed   By: Elige Ko   On: 02/22/2018 14:18   Dg Chest 2 View  Result Date: 02/20/2018 CLINICAL DATA:  Shortness of breath, atrial fibrillation, coronary artery disease EXAM: CHEST - 2 VIEW COMPARISON:  02/13/2018 FINDINGS: Upper normal heart size. Atherosclerotic calcification aorta. Mediastinal contours normal. Asymmetric pulmonary infiltrates RIGHT greater than LEFT question pneumonia versus asymmetric edema. No definite pleural effusion or pneumothorax. Bones demineralized. IMPRESSION: Persistent diffuse BILATERAL pulmonary infiltrates question infection versus asymmetric pulmonary edema, little changed. Electronically Signed   By: Ulyses Southward M.D.   On: 02/20/2018 10:52   Ct Chest Wo Contrast  Result Date: 02/10/2018 CLINICAL DATA:  Interstitial lung  disease. EXAM: CT CHEST WITHOUT CONTRAST TECHNIQUE: Multidetector CT imaging of the chest was performed following the standard protocol without IV contrast. COMPARISON:  Multiple chest x-rays since July 15, 2012 FINDINGS: Cardiovascular: Atherosclerotic change in the nonaneurysmal aorta. The central pulmonary arteries are normal in caliber. Coronary artery calcifications are noted. Mild cardiomegaly. Mediastinum/Nodes: Small right pleural effusion. No pericardial effusion. The esophagus and thyroid is normal. No adenopathy in the chest. Lungs/Pleura: Central airways are normal. No pneumothorax. There are some emphysematous changes in the lungs. Focal pulmonary infiltrates are seen bilaterally. The right infiltrates are most prominent in the right perihilar region, involving the upper and lower lobe. The infiltrate on the left is most prominent in the left lower lobe and posteriorly in the left upper lobe. There are some ground-glass opacities as well. Evaluation for nodules or masses is limited due to the  diffuse opacities. Upper Abdomen: No acute abnormality. Musculoskeletal: There is an age-indeterminate compression fracture of T10, new since 2013. IMPRESSION: 1. Bilateral diffuse pulmonary infiltrates involving all lobes as above. There was significant worsening between January 30, 2018 and February 08, 2018. Given the rapid onset, the infiltrates are most likely infectious or inflammatory. There may be a mild interstitial background component but the interstitial component is not well assessed due to the diffuse infiltrates. Recommend follow-up to resolution. 2. There is a T10 compression fracture new since 2013 but otherwise age indeterminate. 3. Atherosclerotic change in the nonaneurysmal aorta. Coronary artery calcifications. 4. Emphysema. Aortic Atherosclerosis (ICD10-I70.0) and Emphysema (ICD10-J43.9). Electronically Signed   By: Gerome Sam III M.D   On: 02/10/2018 21:13   Dg Chest Port 1 View  Result  Date: 02/25/2018 CLINICAL DATA:  Shortness of breath, CHF, coronary artery disease, right-sided percutaneous pleural catheter for right pneumothorax. EXAM: PORTABLE CHEST 1 VIEW COMPARISON:  Portable chest x-ray of Feb 24, 2018 FINDINGS: A 5% or less right-sided apical pneumothorax is observed. The interstitial markings remain increased and are confluent in the right mid and lower lung. There is no pleural effusion. The lung markings rim main increased in the left mid and lower lung as well. There is no significant pleural effusion on the left. The heart is top-normal in size. The pulmonary vascularity is not engorged. There is calcification in the wall of the aortic arch. IMPRESSION: Tiny right apical pneumothorax persists. Bilateral mid and lower lung interstitial infiltrates. No overt CHF. Thoracic aortic atherosclerosis. Electronically Signed   By: David  [place] M.D.   On: 02/25/2018 09:01   Dg Chest Port 1 View  Result Date: 02/24/2018 CLINICAL DATA:  Shortness of breath EXAM: PORTABLE CHEST 1 VIEW COMPARISON:  02/23/2018 FINDINGS: Cardiac shadow is within normal limits. Right-sided chest tube is again identified. The right pneumothorax has decreased in size when compare with the prior exam. Diffuse opacities are again identified and stable bilaterally. No new focal abnormality is seen. IMPRESSION: Stable bilateral infiltrates. Reduction in right pneumothorax. Electronically Signed   By: Alcide Clever M.D.   On: 02/24/2018 07:34   Dg Chest Port 1 View  Result Date: 02/23/2018 CLINICAL DATA:  83 year old male status post spontaneous right pneumothorax and chest tube placement. EXAM: PORTABLE CHEST 1 VIEW COMPARISON:  0714 hours today and earlier. FINDINGS: Portable AP upright view at 1401 hours. Stable right chest pigtail type chest tube in place. Residual small right apical pneumothorax is stable since yesterday. Continued Patchy and confluent multifocal bilateral pulmonary opacity mostly about the hila.  No pulmonary edema suspected. No definite pleural effusion. Stable cardiac size and mediastinal contours. Calcified aortic atherosclerosis. IMPRESSION: 1. Stable right chest tube and residual right apical pneumothorax. 2. Bilateral pneumonia suspected with no significant change since 02/20/2018. Electronically Signed   By: Odessa Fleming M.D.   On: 02/23/2018 14:14   Dg Chest Port 1 View  Result Date: 02/23/2018 CLINICAL DATA:  83 year old male with a history of right-sided pneumothorax status post thoracostomy EXAM: PORTABLE CHEST 1 VIEW COMPARISON:  02/22/2018, 02/20/2018 FINDINGS: Cardiomediastinal silhouette unchanged in size and contour. Similar appearance of mixed interstitial and airspace opacities in the bilateral mid and lower lungs. No new airspace opacity. Reticular opacities in the periphery with interlobular septal thickening. Unchanged position of right-sided pigtail thoracostomy tube. Likely small residual right apical pneumothorax. IMPRESSION: Likely small residual apical pneumothorax on the right with unchanged position of the thoracostomy tube. Similar appearance of mixed interstitial and airspace disease  of the bilateral lungs. Electronically Signed   By: Gilmer Mor D.O.   On: 02/23/2018 08:36   Dg Chest Port 1 View  Result Date: 02/22/2018 CLINICAL DATA:  Right-sided pneumothorax, follow-up exam EXAM: PORTABLE CHEST 1 VIEW COMPARISON:  02/22/2018 FINDINGS: Small bore pigtail catheter is now seen on the right. The right-sided pneumothorax is slightly reduced when compared with the prior exam. Excursion from the apex is approximately 3.6 cm decreased from 4.4 cm. Stable bibasilar changes are noted similar to that noted on the prior exam. No acute bony abnormality is noted. No other focal abnormality is seen. IMPRESSION: Slight reduction in right-sided pneumothorax. Stable bibasilar opacities are noted. Electronically Signed   By: Alcide Clever M.D.   On: 02/22/2018 19:51   Dg Chest Port 1  View  Result Date: 02/13/2018 CLINICAL DATA:  Shortness of breath. EXAM: PORTABLE CHEST 1 VIEW COMPARISON:  Radiograph of February 10, 2018. FINDINGS: Stable cardiomediastinal silhouette. No pneumothorax or pleural effusion is noted. Slightly improved diffuse interstitial and airspace opacities are noted throughout both lungs suggesting improving infection or edema. Bony thorax is unremarkable. IMPRESSION: Slightly improved bilateral diffuse lung opacities suggesting improving edema or infection. Electronically Signed   By: Lupita Raider, M.D.   On: 02/13/2018 09:22   Dg Chest Port 1 View  Result Date: 02/10/2018 CLINICAL DATA:  Hypoxemia EXAM: PORTABLE CHEST 1 VIEW COMPARISON:  02/09/2018 FINDINGS: Diffuse airspace disease and interstitial prominence throughout the lungs, stable. Mild cardiomegaly. No visible effusions or acute bony abnormality. IMPRESSION: Diffuse interstitial and alveolar opacities throughout the lungs could reflect edema or infection, unchanged. Electronically Signed   By: Charlett Nose M.D.   On: 02/10/2018 08:27   Dg Chest Port 1 View  Result Date: 02/09/2018 CLINICAL DATA:  Shortness of breath.  Hypoxemia. EXAM: PORTABLE CHEST 1 VIEW COMPARISON:  February 08, 2018 FINDINGS: Diffuse interstitial opacities throughout the right lung and the left mid and lower lung. No other interval changes. IMPRESSION: Diffuse pulmonary opacities, right greater than left.  No change. Electronically Signed   By: Gerome Sam III M.D   On: 02/09/2018 15:48   Dg Chest Port 1 View  Result Date: 02/08/2018 CLINICAL DATA:  Acute respiratory failure.  Hypoxia. EXAM: PORTABLE CHEST 1 VIEW COMPARISON:  01/30/2018 FINDINGS: Cardiac enlargement. Diffuse interstitial pattern throughout the lungs. Previous studies demonstrate an underlying interstitial pattern but changes appear more prominent today. This suggest developing edema or interstitial pneumonitis superimposed upon chronic fibrosis. Blunting of the  right costophrenic angle may suggest a small effusion. No pneumothorax. Mediastinal contours appear intact. Calcification of the aorta. IMPRESSION: Increasing diffuse interstitial pattern to the lungs likely representing developing edema or interstitial pneumonitis superimposed upon chronic fibrosis. Cardiac enlargement. Aortic atherosclerosis. Electronically Signed   By: Burman Nieves M.D.   On: 02/08/2018 22:55   Dg Chest Port 1 View  Result Date: 01/30/2018 CLINICAL DATA:  Bradycardia. EXAM: PORTABLE CHEST 1 VIEW COMPARISON:  Chest radiograph July 15, 2012 FINDINGS: Cardiac silhouette appears moderately enlarged. Mediastinal silhouette is nonsuspicious, calcified aortic knob. Similar chronic interstitial changes without pleural effusion or focal consolidation though the RIGHT costophrenic angle incompletely imaged. No pneumothorax. Soft tissue planes and included osseous structures are nonsuspicious. IMPRESSION: Stable cardiomegaly and chronic interstitial changes. Aortic Atherosclerosis (ICD10-I70.0). Electronically Signed   By: Awilda Metro M.D.   On: 01/30/2018 15:58   Ir Perc Pleural Drain W/indwell Cath W/img Guide  Result Date: 02/22/2018 INDICATION: 83 year old male with a spontaneous right pneumothorax. EXAM: IMAGE GUIDED  RIGHT THORACOSTOMY TUBE PLACEMENT MEDICATIONS: The patient is currently admitted to the hospital and receiving intravenous antibiotics. The antibiotics were administered within an appropriate time frame prior to the initiation of the procedure. ANESTHESIA/SEDATION: Fentanyl 50 mcg IV; Versed 0 mg IV NONE. COMPLICATIONS: NONE PROCEDURE: The procedure, risks, benefits, and alternatives were explained to the patient/patient's family, who provided informed consent on the patient's behalf. Specific risks that were addressed included bleeding, infection, ongoing pneumothorax, need for further procedure/surgery, chance of hemorrhage, hemoptysis, cardiopulmonary collapse,  death. Questions regarding the procedure were encouraged and answered. The patient understands and consents to the procedure. Patient was positioned in the right anterior oblique position on the IR table and scout image of the chest was performed for planning purposes. The right mid axillary line at the level of the nipple was identified, and prepped and draped in the usual sterile fashion. The skin and subcutaneous tissues were generously infiltrated 1% lidocaine for local anesthesia. A Yueh needle was then used to enter the pleural space with aspiration of air. The plastic Yueh catheter was advanced into the pleural space and an 035 guidewire was advanced to the apex of the lung under fluoroscopy. Dilation of the skin tract was performed over the wire, and then modified Seldinger technique was used to place a 10 French pigtail catheter into the anterior pleural space. Catheter was attached to water seal chamber and suction was applied confirming a operational chest tube. Retention suture was placed.  Sterile dressing was placed. Patient tolerated the procedure well and remained hemodynamically stable throughout. No complications were encountered and no significant blood loss was encounter IMPRESSION: Status post right thoracostomy tube placed. Signed, Yvone Neu. Loreta Ave, DO [facility] Electronically Signed   By: Gilmer Mor D.O.   On: 02/22/2018 16:27     LOS: 17 days   Jeoffrey Massed, MD  [facility] Pager:[phone]  If 7PM-7AM, please contact night-coverage [email or web address] Password [facility] 02/25/2018, 1:38 PM

## 2018-02-25 NOTE — Plan of Care (Signed)
Pt continues to progress 

## 2018-02-25 NOTE — Progress Notes (Signed)
Pt/ resting in bed; CCMD called- pt. had 4 beat run V-tach and no c/o's.

## 2018-02-25 NOTE — Progress Notes (Signed)
Pt states he feels tired.  Unable to sleep at night..States he does not have any pain in his mouth or throat.  O2 sat 94-98% on 2L.  Chest tube was removed this morning.  Hinton Dyer, RN

## 2018-02-25 NOTE — Progress Notes (Signed)
[facility] PCCM Progress Note   BRIEF 83 year old remote smoker with recent NSTEMI and RCA occlusion on cath admitted 4/27 with elevated troponin and acute hypoxic respiratory failure. He was diuresed but  remained severely hypoxic.  He  transitioned off BiPAP  to high flow oxygen , he was started on high-dose steroids  4/29.  02/13/18 and 02/17/18 Results for [facility] (MRN [id number]) as of 02/22/2018 14:58  Ref. Range 02/13/2018 18:18 02/17/2018 11:02  ANA Ab, IFA Unknown  Negative  ANCA Proteinase 3 Latest Ref Range: 0.0 - 3.5 U/mL  <3.5  Anti JO-1 Latest Ref Range: 0.0 - 0.9 AI <0.2   CCP Antibodies IgG/IgA Latest Ref Range: 0 - 19 units 6   Myeloperoxidase Abs Latest Ref Range: 0.0 - 9.0 U/mL  <9.0  RA Latex Turbid. Latest Ref Range: 0.0 - 13.9 IU/mL  <10.0  Cytoplasmic (C-ANCA) Latest Ref Range: Neg:<1:20 titer  <1:20  P-ANCA Latest Ref Range: Neg:<1:20 titer  <1:20  Atypical P-ANCA titer Latest Ref Range: Neg:<1:20 titer  <1:20  ENA SM Ab Ser-aCnc Latest Ref Range: 0.0 - 0.9 AI <0.2   Ribonucleic Protein Latest Ref Range: 0.0 - 0.9 AI <0.2     02/18/18  He is on 10 L high flow nasal cannula this morning.  Denies SOB, chest pain.  Actually states that he feels somewhat better. 5/11  S/p Right PTX 5/11 s/p chest tube by IR .  02/23/18 - resting on 2l/m [place] . Says breathing is doing better.  02/24/2018 - CT to waterseal  SUBJECTIVE:  Feels about the same, on 2L [place]  OBJECTIVE:  General:  Thin male, in bed, NAD HEENT: [place]/AT, PERRL, EOM-I and MMM Neuro: Alert and oriented, moving all ext to command CV: IRIR, Nl S1/S2 and -M/R/G PULM: Distant BS, no leak on pleuravac with cough GI: Soft, NT, ND and +BS Extremities: -edema and -tenderness Skin: Intact, bruising noted  PULMONARY No results for input(s): PHART, PCO2ART, PO2ART, HCO3, TCO2, O2SAT in the last 168 hours.  Invalid input(s): PCO2, PO2  CBC Recent Labs  Lab 02/22/18 0611 02/23/18 0427 02/24/18 0746  HGB 16.0 17.2* 17.0   HCT 47.0 49.4 49.8  WBC 17.0* 15.6* 15.7*  PLT 224 207 183    COAGULATION No results for input(s): INR in the last 168 hours.  CARDIAC  No results for input(s): TROPONINI in the last 168 hours. No results for input(s): PROBNP in the last 168 hours.   CHEMISTRY Recent Labs  Lab 02/20/18 0459 02/21/18 0507 02/22/18 0611 02/23/18 0427 02/24/18 0746  NA 137 136 140 140 140  K 4.3 3.8 3.3* 3.8 4.0  CL 101 96* 101 101 102  CO2 GLUCOSE 142* 124* 108* 106* 96  BUN 46* 44* 42* 42* 43*  CREATININE 1.44* 1.48* 1.40* 1.36* 1.51*  CALCIUM 9.2 9.5 9.4 9.5 9.4   Estimated Creatinine Clearance: 41.3 mL/min (A) (by C-G formula based on SCr of 1.51 mg/dL (H)).   LIVER No results for input(s): AST, ALT, ALKPHOS, BILITOT, PROT, ALBUMIN, INR in the last 168 hours.   INFECTIOUS No results for input(s): LATICACIDVEN, PROCALCITON in the last 168 hours.   ENDOCRINE CBG (last 3)  Recent Labs    02/24/18 1653 02/24/18 2115 02/25/18 1140  GLUCAP 164* 148* 110*    IMAGING x48h  - image(s) personally visualized  -   highlighted in bold Dg Chest Port 1 View  Result Date: 02/25/2018 CLINICAL DATA:  Shortness of breath, CHF,  coronary artery disease, right-sided percutaneous pleural catheter for right pneumothorax. EXAM: PORTABLE CHEST 1 VIEW COMPARISON:  Portable chest x-ray of Feb 24, 2018 FINDINGS: A 5% or less right-sided apical pneumothorax is observed. The interstitial markings remain increased and are confluent in the right mid and lower lung. There is no pleural effusion. The lung markings rim main increased in the left mid and lower lung as well. There is no significant pleural effusion on the left. The heart is top-normal in size. The pulmonary vascularity is not engorged. There is calcification in the wall of the aortic arch. IMPRESSION: Tiny right apical pneumothorax persists. Bilateral mid and lower lung interstitial infiltrates. No overt CHF. Thoracic aortic  atherosclerosis. Electronically Signed   By: David  [place] M.D.   On: 02/25/2018 09:01   Dg Chest Port 1 View  Result Date: 02/24/2018 CLINICAL DATA:  Shortness of breath EXAM: PORTABLE CHEST 1 VIEW COMPARISON:  02/23/2018 FINDINGS: Cardiac shadow is within normal limits. Right-sided chest tube is again identified. The right pneumothorax has decreased in size when compare with the prior exam. Diffuse opacities are again identified and stable bilaterally. No new focal abnormality is seen. IMPRESSION: Stable bilateral infiltrates. Reduction in right pneumothorax. Electronically Signed   By: Alcide Clever M.D.   On: 02/24/2018 07:34   Dg Chest Port 1 View  Result Date: 02/23/2018 CLINICAL DATA:  83 year old male status post spontaneous right pneumothorax and chest tube placement. EXAM: PORTABLE CHEST 1 VIEW COMPARISON:  0714 hours today and earlier. FINDINGS: Portable AP upright view at 1401 hours. Stable right chest pigtail type chest tube in place. Residual small right apical pneumothorax is stable since yesterday. Continued Patchy and confluent multifocal bilateral pulmonary opacity mostly about the hila. No pulmonary edema suspected. No definite pleural effusion. Stable cardiac size and mediastinal contours. Calcified aortic atherosclerosis. IMPRESSION: 1. Stable right chest tube and residual right apical pneumothorax. 2. Bilateral pneumonia suspected with no significant change since 02/20/2018. Electronically Signed   By: Odessa Fleming M.D.   On: 02/23/2018 14:14   I reviewed CXR myself, small apical PTX noted without change  Assessment and Plan  83 year old male with ILD and spontaneous PTX, CT placed to water seal on 5/13 and repeat CXR with no changes and no leak.  Discussed with PCCM-NP.  ILD:  - Complete a week of prednisone 50 mg PO daily then begin a 2 week taper of prednisone  - When closer to discharge will need outpatient f/u with pulmonary  Hypoxemia:  - Titrate O2 for sat of 88-92%  -  Will need arranging of home O2 prior to discharge  PTX: no air leak  - Remove chest tube  - CXR in AM to insure no change  PCCM will continue to follow.  Alyson Reedy, M.D. [facility] Pulmonary/Critical Care Medicine. Pager: [phone]. After hours pager: [phone].

## 2018-02-25 NOTE — Progress Notes (Signed)
Pt had a 5 beats run of V-tach while resting in bed. Asymptomatic.  Dr. Jerral Ralph made aware.  Hinton Dyer, RN

## 2018-02-25 NOTE — Progress Notes (Signed)
Progress Note  Patient Name: Craig [name] Date of Encounter: 02/25/2018  Primary Cardiologist: Nona Dell, MD   Subjective   Remains dyspneic; no chest pain  Inpatient Medications    Scheduled Meds: . allopurinol  300 mg Oral QPM  . apixaban  5 mg Oral BID  . aspirin EC  81 mg Oral Daily  . atorvastatin  20 mg Oral q1800  . cefdinir  300 mg Oral Q12H  . feeding supplement (ENSURE ENLIVE)  237 mL Oral TID BM  . furosemide  40 mg Oral Daily  . gemfibrozil  600 mg Oral BID AC  . guaiFENesin  600 mg Oral BID  . insulin aspart  0-9 Units Subcutaneous TID WC  . ipratropium  0.5 mg Nebulization TID  . levalbuterol  1.25 mg Nebulization TID  . liver oil-zinc oxide   Topical q morning - 10a  . metoprolol succinate  37.5 mg Oral Daily  . nystatin  5 mL Oral QID  . predniSONE  50 mg Oral Q breakfast  . senna-docusate  1 tablet Oral BID  . sodium chloride flush  3 mL Intravenous Q12H  . tamsulosin  0.4 mg Oral QPC supper   Continuous Infusions: . sodium chloride 250 mL (02/19/18 1437)   PRN Meds: sodium chloride, acetaminophen, ALPRAZolam, artificial tears, levalbuterol, magnesium hydroxide, menthol-cetylpyridinium, ondansetron (ZOFRAN) IV, phenol, sodium chloride, sodium chloride flush   Vital Signs    Vitals:   02/24/18 2010 02/24/18 2028 02/25/18 0054 02/25/18 0456  BP: 123/80  121/77 133/88  Pulse: 90  89 78  Resp: (!) Temp: 97.9 F (36.6 C)  (!) 97.5 F (36.4 C)   TempSrc: Oral  Oral   SpO2: 97% 99% 93% 93%  Weight:    164 lb 14.4 oz (74.8 kg)  Height:        Intake/Output Summary (Last 24 hours) at 02/25/2018 0737 Last data filed at 02/25/2018 0300 Gross per 24 hour  Intake 1200 ml  Output 1050 ml  Net 150 ml   Filed Weights   02/23/18 0458 02/24/18 0541 02/25/18 0456  Weight: 155 lb 6.8 oz (70.5 kg) 146 lb 9.6 oz (66.5 kg) 164 lb 14.4 oz (74.8 kg)    Telemetry    Atrial fibrillation with aberrancy- Personally Reviewed   Physical  Exam   GEN: No acute distress.  WD chronically ill appearing Neck: No JVD, supple Cardiac: irregular, no murmur Respiratory: Basilar crackles; no wheeze; chest tube in place GI: Soft, NT/ND MS: No edema Neuro:  Nonfocal; grossly intact   Labs    Chemistry Recent Labs  Lab 02/22/18 0611 02/23/18 0427 02/24/18 0746  NA 140 140 140  K 3.3* 3.8 4.0  CL 101 101 102  CO2 GLUCOSE 108* 106* 96  BUN 42* 42* 43*  CREATININE 1.40* 1.36* 1.51*  CALCIUM 9.4 9.5 9.4  GFRNONAA 46* 48* 42*  GFRAA 53* 55* 49*  ANIONGAP Hematology Recent Labs  Lab 02/22/18 0611 02/23/18 0427 02/24/18 0746  WBC 17.0* 15.6* 15.7*  RBC 4.69 4.89 4.94  HGB 16.0 17.2* 17.0  HCT 47.0 49.4 49.8  MCV 100.2* 101.0* 100.8*  MCH 34.1* 35.2* 34.4*  MCHC 34.0 34.8 34.1  RDW 14.0 14.2 14.3  PLT 224 207 183    Radiology    Dg Chest Port 1 View  Result Date: 02/24/2018 CLINICAL DATA:  Shortness of breath EXAM: PORTABLE CHEST 1 VIEW COMPARISON:  02/23/2018 FINDINGS: Cardiac shadow is within normal limits. Right-sided chest tube is again identified. The right pneumothorax has decreased in size when compare with the prior exam. Diffuse opacities are again identified and stable bilaterally. No new focal abnormality is seen. IMPRESSION: Stable bilateral infiltrates. Reduction in right pneumothorax. Electronically Signed   By: Alcide Clever M.D.   On: 02/24/2018 07:34   Dg Chest Port 1 View  Result Date: 02/23/2018 CLINICAL DATA:  83 year old male status post spontaneous right pneumothorax and chest tube placement. EXAM: PORTABLE CHEST 1 VIEW COMPARISON:  0714 hours today and earlier. FINDINGS: Portable AP upright view at 1401 hours. Stable right chest pigtail type chest tube in place. Residual small right apical pneumothorax is stable since yesterday. Continued Patchy and confluent multifocal bilateral pulmonary opacity mostly about the hila. No pulmonary edema suspected. No definite pleural  effusion. Stable cardiac size and mediastinal contours. Calcified aortic atherosclerosis. IMPRESSION: 1. Stable right chest tube and residual right apical pneumothorax. 2. Bilateral pneumonia suspected with no significant change since 02/20/2018. Electronically Signed   By: Odessa Fleming M.D.   On: 02/23/2018 14:14    Patient Profile     83 y.o. male with PMH of CAD s/p recent NSTEMI 01/31/18 (RCA occlusion medically managed), permanent atrial fibrillation, CKD stage III, who presented with syncope and found to have acute hypoxic respiratory failure (CHF exacerbation vs aspiration). Echo with contrast on this admission is consistent with apical variant hypertrophic cardiomyopathy.  Assessment & Plan    1 acute hypoxic respiratory failure-patient remains significantly dyspneic.  He is not volume overloaded on examination.  Will continue low-dose Lasix to keep on dry side.  This appears to be mostly inflammatory lung disease.  Continue steroids as outlined by pulmonary.    2 pneumothorax-chest tube in place.  Pulmonary and interventional radiology managing.  3 permanent atrial fibrillation-continue beta-blocker for rate control.  Continue apixaban. We will recheck creatinine tomorrow morning and if 1.5 or greater  I will reduce dose to 2.5 mg twice daily.  4 apical variant hypertrophic cardiomyopathy-we will continue with present dose of beta-blockade.    5 coronary artery disease status post recent non-ST elevation myocardial infarction-cardiac catheterization revealed occluded RCA managed medically. No recurrent chest pain. Continue medical therapy with aspirin, statin and beta-blocker.  6 chronic stage III kidney disease-Recheck bmet in AM.  For questions or updates, please contact [facility] Please consult [email or web address] for contact info under Cardiology/STEMI.      Signed, Olga Millers, MD  02/25/2018, 7:37 AM

## 2018-02-25 NOTE — Progress Notes (Signed)
Patient declined NIV at this time, no distress noted RCP will continue to monitor, HHFNC taken out of room.

## 2018-02-25 NOTE — Progress Notes (Addendum)
4:25 pm [facility] does not have beds available and likely will not until Friday or until next week. CSW to go ahead with discharge to [facility] when patient medically ready.  2:56 pm CSW met with patient and sister-in-law at bedside. Patient alert and oriented. Patient and family requesting referral to [facility] in [place], [place]. Patient does already have a confirmed bed at [facility]. CSW called [facility] and left voicemail with admissions to discuss making referral for patient.  SIL indicated she needs to know ahead of time when patient will discharge so she can make arrangements and also provide a check for [facility] (patient going to facility outside of [facility] 50 mile range so is required to pay out of pocket). CSW advised that per MD note, plan for repeat CXR tomorrow, and likely discharge; CSW advised SIL to prepare for likelihood that patient will be medically cleared for discharge tomorrow.  CSW to follow and support with discharge when medically ready.  Estanislado Emms, [place]

## 2018-02-26 ENCOUNTER — Inpatient Hospital Stay (HOSPITAL_COMMUNITY): Payer: Medicare Other

## 2018-02-26 ENCOUNTER — Ambulatory Visit: Payer: Medicare Other | Admitting: Physician Assistant

## 2018-02-26 DIAGNOSIS — E43 Unspecified severe protein-calorie malnutrition: Secondary | ICD-10-CM

## 2018-02-26 LAB — BASIC METABOLIC PANEL
Anion gap: 10 (ref 5–15)
BUN: 42 mg/dL — ABNORMAL HIGH (ref 6–20)
CALCIUM: 9.4 mg/dL (ref 8.9–10.3)
CO2: 31 mmol/L (ref 22–32)
CREATININE: 1.31 mg/dL — AB (ref 0.61–1.24)
Chloride: 95 mmol/L — ABNORMAL LOW (ref 101–111)
GFR calc non Af Amer: 50 mL/min — ABNORMAL LOW (ref >60)
GFR, EST AFRICAN AMERICAN: 58 mL/min — AB (ref >60)
Glucose, Bld: 126 mg/dL — ABNORMAL HIGH (ref 65–99)
Potassium: 4 mmol/L (ref 3.5–5.1)
SODIUM: 136 mmol/L (ref 135–145)

## 2018-02-26 LAB — GLUCOSE, CAPILLARY
GLUCOSE-CAPILLARY: 158 mg/dL — AB (ref 65–99)
Glucose-Capillary: 98 mg/dL (ref 65–99)

## 2018-02-26 MED ORDER — ENSURE ENLIVE PO LIQD
237.0000 mL | Freq: Three times a day (TID) | ORAL | 12 refills | Status: AC
Start: 2018-02-26 — End: ?

## 2018-02-26 MED ORDER — ALPRAZOLAM 0.25 MG PO TABS: 0.2500 mg | ORAL_TABLET | Freq: Two times a day (BID) | ORAL | 0 refills | Status: AC | PRN

## 2018-02-26 MED ORDER — PREDNISONE 10 MG PO TABS: ORAL_TABLET | ORAL | 0 refills | Status: AC

## 2018-02-26 MED ORDER — FUROSEMIDE 20 MG PO TABS: 40.0000 mg | ORAL_TABLET | Freq: Every day | ORAL | Status: AC

## 2018-02-26 MED ORDER — IPRATROPIUM BROMIDE 0.02 % IN SOLN: 0.5000 mg | Freq: Three times a day (TID) | RESPIRATORY_TRACT | 12 refills | Status: AC

## 2018-02-26 MED ORDER — METOPROLOL SUCCINATE ER 25 MG PO TB24: 37.5000 mg | ORAL_TABLET | Freq: Every day | ORAL | 1 refills | Status: AC

## 2018-02-26 MED ORDER — TAMSULOSIN HCL 0.4 MG PO CAPS: 0.4000 mg | ORAL_CAPSULE | Freq: Every day | ORAL | Status: AC

## 2018-02-26 MED ORDER — LEVALBUTEROL HCL 1.25 MG/0.5ML IN NEBU: 1.2500 mg | INHALATION_SOLUTION | Freq: Four times a day (QID) | RESPIRATORY_TRACT | 12 refills | Status: AC | PRN

## 2018-02-26 MED ORDER — LEVALBUTEROL HCL 1.25 MG/0.5ML IN NEBU: 1.2500 mg | INHALATION_SOLUTION | Freq: Three times a day (TID) | RESPIRATORY_TRACT | 12 refills | Status: AC

## 2018-02-26 MED ORDER — ARTIFICIAL TEARS OPHTHALMIC OINT: TOPICAL_OINTMENT | OPHTHALMIC | Status: AC | PRN

## 2018-02-26 MED ORDER — PANTOPRAZOLE SODIUM 40 MG PO TBEC: 40.0000 mg | DELAYED_RELEASE_TABLET | Freq: Every day | ORAL | Status: DC

## 2018-02-26 MED ORDER — PANTOPRAZOLE SODIUM 40 MG PO TBEC: 40.0000 mg | DELAYED_RELEASE_TABLET | Freq: Every day | ORAL | Status: AC

## 2018-02-26 NOTE — [place] FL2 (Signed)
[place] MEDICAID FL2 LEVEL OF CARE SCREENING TOOL     IDENTIFICATION  Patient Name: Craig [name] Birthdate: 09-Nov-1934 Sex: male Admission Date (Current Location): 02/08/2018  [facility] and [place] Number:  Producer, television/film/video and Address:  [facility], [address], [place], [place] [address]      Provider Number: [id number]  Attending Physician Name and Address:  Maretta Bees, MD  Relative Name and Phone Number:  Dudley Major, sister in law    Current Level of Care: Hospital Recommended Level of Care: Skilled Nursing Facility Prior Approval Number:    Date Approved/Denied:   PASRR Number:    Discharge Plan: SNF    Current Diagnoses: Patient Active Problem List   Diagnosis Date Noted  . Pneumothorax   . Hypoxemia   . Pneumothorax on right   . Muscular deconditioning   . Apical variant hypertrophic cardiomyopathy (HCC)   . Pressure injury of skin 02/17/2018  . ILD (interstitial lung disease) (HCC)   . Respiratory failure (HCC) 02/08/2018  . Hyperglycemia 02/08/2018  . Acute pulmonary edema (HCC) 02/08/2018  . CKD (chronic kidney disease), stage III (HCC) 02/08/2018  . Atrial fibrillation with slow ventricular response (HCC) 02/04/2018  . Dyslipidemia 02/04/2018  . Non-STEMI (non-ST elevated myocardial infarction) (HCC) 01/30/2018  . Permanent atrial fibrillation (HCC) 11/08/2010  . HYPERLIPIDEMIA 11/07/2009  . Essential hypertension, benign 11/07/2009  . Coronary atherosclerosis of native coronary artery 11/07/2009    Orientation RESPIRATION BLADDER Height & Weight     Self, Time, Situation, Place  O2(nasal cannula 2L) Incontinent, External catheter Weight: 165 lb 4.8 oz (75 kg) Height:   (185.4 cm)  BEHAVIORAL SYMPTOMS/MOOD NEUROLOGICAL BOWEL NUTRITION STATUS      Continent Diet(please see DC summary)  AMBULATORY STATUS COMMUNICATION OF NEEDS Skin   Extensive Assist Verbally PU Stage and Appropriate Care(PU stage II  coccyx)                       Personal Care Assistance Level of Assistance  Bathing, Feeding, Dressing Bathing Assistance: Limited assistance Feeding assistance: Limited assistance Dressing Assistance: Limited assistance     Functional Limitations Info  Sight, Hearing, Speech Sight Info: Adequate Hearing Info: Adequate Speech Info: Adequate    SPECIAL CARE FACTORS FREQUENCY  PT (By licensed PT)     PT Frequency: 5x/week              Contractures Contractures Info: Not present    Additional Factors Info  Insulin Sliding Scale, Code Status, Allergies Code Status Info: Full Allergies Info: No Known Allergies   Insulin Sliding Scale Info: insulin 3x/day with meals       Current Medications (02/26/2018):  This is the current hospital active medication list Current Facility-Administered Medications  Medication Dose Route Frequency Provider Last Rate Last Dose  . 0.9 %  sodium chloride infusion  250 mL Intravenous PRN Opyd, Lavone Neri, MD 10 mL/hr at 02/19/18 1437 250 mL at 02/19/18 1437  . acetaminophen (TYLENOL) tablet 650 mg  650 mg Oral Q4H PRN Opyd, Lavone Neri, MD   650 mg at 02/16/18 1239  . allopurinol (ZYLOPRIM) tablet 300 mg  300 mg Oral QPM Opyd, Lavone Neri, MD   300 mg at 02/25/18 1838  . ALPRAZolam Prudy Feeler) tablet 0.25 mg  0.25 mg Oral BID PRN Briscoe Deutscher, MD   0.25 mg at 02/14/18 2127  . apixaban (ELIQUIS) tablet 5 mg  5 mg Oral BID  Maretta Bees, MD   5 mg at 02/26/18 [phone]  . artificial tears (LACRILUBE) ophthalmic ointment   Both Eyes Q4H PRN Albertine Grates, MD      . aspirin EC tablet 81 mg  81 mg Oral Daily Opyd, Lavone Neri, MD   81 mg at 02/26/18 0842  . atorvastatin (LIPITOR) tablet 20 mg  20 mg Oral q1800 Opyd, Lavone Neri, MD   20 mg at 02/25/18 1838  . cefdinir (OMNICEF) capsule 300 mg  300 mg Oral Q12H Haydee Salter, MD   300 mg at 02/26/18 0843  . feeding supplement (ENSURE ENLIVE) (ENSURE ENLIVE) liquid 237 mL  237 mL Oral TID BM Ghimire, Werner Lean, MD   237 mL at 02/25/18 1512  . furosemide (LASIX) tablet 40 mg  40 mg Oral Daily Croitoru, Mihai, MD   40 mg at 02/26/18 0843  . gemfibrozil (LOPID) tablet 600 mg  600 mg Oral BID AC Scarlett Presto, RPH   600 mg at 02/26/18 0840  . guaiFENesin (MUCINEX) 12 hr tablet 600 mg  600 mg Oral BID Haydee Salter, MD   600 mg at 02/26/18 0843  . insulin aspart (novoLOG) injection 0-9 Units  0-9 Units Subcutaneous TID WC Blount, Janalyn Rouse, NP   3 Units at 02/25/18 1838  . ipratropium (ATROVENT) nebulizer solution 0.5 mg  0.5 mg Nebulization TID Leatha Gilding, MD   0.5 mg at 02/26/18 0904  . levalbuterol (XOPENEX) nebulizer solution 1.25 mg  1.25 mg Nebulization Q6H PRN Haydee Salter, MD      . levalbuterol Pauline Aus) nebulizer solution 1.25 mg  1.25 mg Nebulization TID Leatha Gilding, MD   1.25 mg at 02/26/18 0903  . liver oil-zinc oxide (DESITIN) 40 % ointment   Topical q morning - 10a Albertine Grates, MD      . magnesium hydroxide (MILK OF MAGNESIA) suspension 30 mL  30 mL Oral Daily PRN Murlean Hark, RN   30 mL at 02/25/18 1021  . menthol-cetylpyridinium (CEPACOL) lozenge 3 mg  1 lozenge Oral PRN Madelyn Flavors A, MD      . metoprolol succinate (TOPROL-XL) 24 hr tablet 37.5 mg  37.5 mg Oral Daily Leatha Gilding, MD   37.5 mg at 02/26/18 0841  . nystatin (MYCOSTATIN) 100000 UNIT/ML suspension 500,000 Units  5 mL Oral QID Albertine Grates, MD   500,000 Units at 02/26/18 0840  . ondansetron (ZOFRAN) injection 4 mg  4 mg Intravenous Q6H PRN Opyd, Lavone Neri, MD      . phenol (CHLORASEPTIC) mouth spray 1 spray  1 spray Mouth/Throat PRN Albertine Grates, MD   1 spray at 02/14/18 0313  . predniSONE (DELTASONE) tablet 50 mg  50 mg Oral Q breakfast Haydee Salter, MD   50 mg at 02/26/18 0840  . senna-docusate (Senokot-S) tablet 1 tablet  1 tablet Oral BID Albertine Grates, MD   1 tablet at 02/26/18 0844  . sodium chloride (OCEAN) 0.65 % nasal spray 1 spray  1 spray Each Nare PRN Albertine Grates, MD      . sodium chloride flush  (NS) 0.9 % injection 3 mL  3 mL Intravenous Q12H Opyd, Lavone Neri, MD   3 mL at 02/26/18 0844  . sodium chloride flush (NS) 0.9 % injection 3 mL  3 mL Intravenous PRN Opyd, Lavone Neri, MD      . tamsulosin (FLOMAX) capsule 0.4 mg  0.4 mg Oral QPC supper Albertine Grates, MD   0.4 mg  at 02/25/18 1838  . zolpidem (AMBIEN) tablet 5 mg  5 mg Oral QHS PRN Maretta Bees, MD   5 mg at 02/25/18 2204     Discharge Medications: Please see discharge summary for a list of discharge medications.  Relevant Imaging Results:  Relevant Lab Results:   Additional Information SSN: [id number]  Abigail Butts, LCSW

## 2018-02-26 NOTE — Plan of Care (Signed)
  Problem: Safety: Goal: Ability to remain free from injury will improve Outcome: Progressing Note:  Bed alarm on; pt. aware to call for assist.

## 2018-02-26 NOTE — Progress Notes (Signed)
CCMD called pt. had 7 beats wide QRS; pt. resting , no c/o's; Dr. Clarnce Flock paged.

## 2018-02-26 NOTE — Discharge Summary (Signed)
PATIENT DETAILS Name: Craig [name] Age: 83 y.o. Sex: male Date of Birth: [birth year] MRN: [id number]. Admitting Physician: Briscoe Deutscher, MD [id number], Angelina Pih, MD  Admit Date: 02/08/2018 Discharge date: 02/26/2018  Recommendations for Outpatient Follow-up:  1. Follow up with PCP in 1-2 weeks 2. Please obtain BMP/CBC in one week 3. Please ensure follow-up with Dr. Marchelle Gearing (pulmonology) in the next 1-2 weeks 4. Essential follow-up with cardiology in the next 1-2 weeks. 5. Taper steroids off in 2 weeks-see below 6. Being discharged on home O2  Admitted From:  Home  Disposition: SNF   Home Health: No  Equipment/Devices: Oxygen 2L, foley  Discharge Condition: Stable  CODE STATUS: FULL CODE  Diet recommendation:  Heart Healthy  Brief Summary: See H&P, Labs, Consult and Test reports for all details in brief, Patient is a 83 y.o. male with prior history of A. fib on anticoagulation, CAD-recent cardiac catheterization on 4/19 showed chronically occluded RCA-on medical management-admitted with shortness of breath, found to have acute hypoxic respiratory failure secondary to persistent bilateral pulmonary infiltrates-probably inflammatory in etiology.  Hospital course complicated by development of pneumothorax.  See below for further details   Brief Hospital Course: Acute hypoxic respiratory failure: Initially thought to be decompensated diastolic heart failure, but after adequate diuresis-he continued to have persistent interstitial changes-he is now thought to have interstitial lung disease.  Autoimmune panel negative.  Remains on steroids-we will begin a 2-week taper on discharge.  When he has recovered from this acute illness and debility-he will require more work-up in the outpatient setting with pulmonology.Hypoxemia has improved significantly-previously was on high flow O2, currently he is on just 2-3 L of oxygen.  He will oxygen on discharge-as he continues to desaturate  down to the high 80s on room air.  Spontaneous right-sided pneumothorax: Underwent right-sided chest tube placement by IR on 5/11-chest tube removed on 5/14-repeat chest x-ray on 5/15 without any pneumothorax.   Acute on chronic diastolic heart failure: Compensated-continue Lasix.  Continue to try to keep in negative balance.  Please ensure outpatient follow-up with cardiology  Leukocytosis: Secondary to steroids-no indication of infection at this time-continue to monitor off all antimicrobial agents.    Oral thrush: Marland Kitchen  Completed a course of nystatin.  CAD: No anginal symptoms-shortness of breath above due to above noted reasons.  Recent LHC on 4/19 showed chronic RCA occlusion with recommendations for medical management.  Continue aspirin, statin and metoprolol.  Chronic atrial fibrillation: Rate controlled with metoprolol-remains on Eliquis.   Apical variant hypertrophic cardiomyopathy: Continue beta-blocker-ensure outpatient follow-up with cardiology.  BPH: Continue Flomax.   Gout: No evidence of flare-follow  Deconditioning/debility: Secondary to acute illness-SNF on discharge.  Procedures/Studies: 5/12>> right-sided chest tube placement by IR  Discharge Diagnoses:  Principal Problem:   Acute pulmonary edema (HCC) Active Problems:   Coronary atherosclerosis of native coronary artery   Permanent atrial fibrillation (HCC)   Non-STEMI (non-ST elevated myocardial infarction) (HCC)   Respiratory failure (HCC)   Hyperglycemia   CKD (chronic kidney disease), stage III (HCC)   ILD (interstitial lung disease) (HCC)   Pressure injury of skin   Muscular deconditioning   Apical variant hypertrophic cardiomyopathy (HCC)   Pneumothorax on right   Pneumothorax   Hypoxemia   Discharge Instructions:  Activity:  As tolerated with Full fall precautions use walker/cane & assistance as needed   Discharge Instructions    Diet - low sodium heart healthy   Complete by:  As  directed  Discharge instructions   Complete by:  As directed    Follow with Primary MD  Doreen Beam B, MD in 1 week  Please ensure follow-up with pulmonology-Dr. Marchelle Gearing in the next 1-2 weeks  Please ensure follow-up with cardiology in the next 1-2 weeks  Remain on 2 L of oxygen at all times Please get a complete blood count and chemistry panel checked by your Primary MD at your next visit, and again as instructed by your Primary MD.  Get Medicines reviewed and adjusted: Please take all your medications with you for your next visit with your Primary MD  Laboratory/radiological data: Please request your Primary MD to go over all hospital tests and procedure/radiological results at the follow up, please ask your Primary MD to get all Hospital records sent to his/her office.  In some cases, they will be blood work, cultures and biopsy results pending at the time of your discharge. Please request that your primary care M.D. follows up on these results.  Also Note the following: If you experience worsening of your admission symptoms, develop shortness of breath, life threatening emergency, suicidal or homicidal thoughts you must seek medical attention immediately by calling 911 or calling your MD immediately  if symptoms less severe.  You must read complete instructions/literature along with all the possible adverse reactions/side effects for all the Medicines you take and that have been prescribed to you. Take any new Medicines after you have completely understood and accpet all the possible adverse reactions/side effects.   Do not drive when taking Pain medications or sleeping medications (Benzodaizepines)  Do not take more than prescribed Pain, Sleep and Anxiety Medications. It is not advisable to combine anxiety,sleep and pain medications without talking with your primary care practitioner  Special Instructions: If you have smoked or chewed Tobacco  in the last 2 yrs please stop  smoking, stop any regular Alcohol  and or any Recreational drug use.  Wear Seat belts while driving.  Please note: You were cared for by a hospitalist during your hospital stay. Once you are discharged, your primary care physician will handle any further medical issues. Please note that NO REFILLS for any discharge medications will be authorized once you are discharged, as it is imperative that you return to your primary care physician (or establish a relationship with a primary care physician if you do not have one) for your post hospital discharge needs so that they can reassess your need for medications and monitor your lab values.   Increase activity slowly   Complete by:  As directed      Allergies as of 02/26/2018   No Known Allergies     Medication List    STOP taking these medications   amLODipine 5 MG tablet Commonly known as:  NORVASC   doxazosin 8 MG tablet Commonly known as:  CARDURA   sildenafil 20 MG tablet Commonly known as:  REVATIO     TAKE these medications   allopurinol 300 MG tablet Commonly known as:  ZYLOPRIM Take 300 mg by mouth every evening.   ALPRAZolam 0.25 MG tablet Commonly known as:  XANAX Take 1 tablet (0.25 mg total) by mouth 2 (two) times daily as needed for anxiety.   apixaban 5 MG Tabs tablet Commonly known as:  ELIQUIS Take 1 tablet (5 mg total) by mouth 2 (two) times daily.   artificial tears Oint ophthalmic ointment Commonly known as:  LACRILUBE Place into both eyes every 4 (four) hours as needed for dry  eyes.   aspirin 81 MG EC tablet Take 1 tablet (81 mg total) by mouth daily.   atorvastatin 20 MG tablet Commonly known as:  LIPITOR Take 1 tablet (20 mg total) by mouth every evening.   DERMEND BRUISE FORMULA Crea Apply 1 application topically as needed (dry skin/irritation).   feeding supplement (ENSURE ENLIVE) Liqd Take 237 mLs by mouth 3 (three) times daily between meals.   furosemide 20 MG tablet Commonly known as:   LASIX Take 2 tablets (40 mg total) by mouth daily. What changed:  how much to take   gemfibrozil 600 MG tablet Commonly known as:  LOPID Take 600 mg by mouth 2 (two) times daily.   ipratropium 0.02 % nebulizer solution Commonly known as:  ATROVENT Take 2.5 mLs (0.5 mg total) by nebulization 3 (three) times daily.   levalbuterol 1.25 MG/0.5ML nebulizer solution Commonly known as:  XOPENEX Take 1.25 mg by nebulization every 6 (six) hours as needed for wheezing or shortness of breath.   levalbuterol 1.25 MG/0.5ML nebulizer solution Commonly known as:  XOPENEX Take 1.25 mg by nebulization 3 (three) times daily.   metoprolol succinate 25 MG 24 hr tablet Commonly known as:  TOPROL XL Take 1.5 tablets (37.5 mg total) by mouth daily. What changed:  how much to take   pantoprazole 40 MG tablet Commonly known as:  PROTONIX Take 1 tablet (40 mg total) by mouth daily.   predniSONE 10 MG tablet Commonly known as:  DELTASONE Take 5 tablets (50 mg) daily for 3 days, then, Take 4 tablets (40 mg) daily for 3 days, then, Take 3 tablets (30 mg) daily for 3 days, then, Take 2 tablets (20 mg) daily for 3 days,then, Take 1 tablet (10) daily for 2 days and then stop then stop   tamsulosin 0.4 MG Caps capsule Commonly known as:  FLOMAX Take 1 capsule (0.4 mg total) by mouth daily after supper.       Contact information for follow-up providers    Vyas, Herminio Commons B, MD. Schedule an appointment as soon as possible for a visit in 1 week(s).   Specialty:  Internal Medicine Contact information: [address] [place] [address] [phone]        Jonelle Sidle, MD. Schedule an appointment as soon as possible for a visit in 2 week(s).   Specialty:  Cardiology Contact information: [address] [place] [address] [phone]        Kalman Shan, MD. Schedule an appointment as soon as possible for a visit in 1 week(s).   Specialty:  Pulmonary Disease Contact information: [address] [place] [address] [phone]            Contact information for after-discharge care    Destination    [facility] .   Service:  Skilled Nursing Contact information: [address] [place] [address] [phone]                 No Known Allergies    Consultations:  Cardiology and pulmonology  Other Procedures/Studies: Dg Chest 2 View  Result Date: 02/22/2018 CLINICAL DATA:  Hypoxia EXAM: CHEST - 2 VIEW COMPARISON:  02/20/2018 FINDINGS: Moderate-sized right pneumothorax measuring 4.4 cm at the apex. Pneumothorax measures approximately 20%. Bilateral interstitial thickening. Bilateral lower lobe airspace disease. Trace left pleural effusion. Stable cardiomediastinal silhouette. No acute osseous abnormality. IMPRESSION: 1. Moderate-sized right pneumothorax measuring approximately 20%. Critical Value/emergent results were called by telephone at the time  of interpretation on 02/22/2018 at 2:16 pm to [facility] , who verbally acknowledged these results. 2. Bilateral lower lobe airspace disease most concerning for pneumonia. Electronically Signed   By: Elige Ko   On: 02/22/2018 14:18   Dg Chest 2 View  Result Date: 02/20/2018 CLINICAL DATA:  Shortness of breath, atrial fibrillation, coronary artery disease EXAM: CHEST - 2 VIEW COMPARISON:  02/13/2018 FINDINGS: Upper normal heart size. Atherosclerotic calcification aorta. Mediastinal contours normal. Asymmetric pulmonary infiltrates RIGHT greater than LEFT question pneumonia versus asymmetric edema. No definite pleural effusion or pneumothorax. Bones demineralized. IMPRESSION: Persistent diffuse BILATERAL pulmonary infiltrates question infection versus asymmetric pulmonary edema, little changed. Electronically Signed   By: Ulyses Southward M.D.   On: 02/20/2018 10:52   Ct Chest Wo Contrast  Result Date: 02/10/2018 CLINICAL DATA:  Interstitial lung disease. EXAM: CT CHEST WITHOUT  CONTRAST TECHNIQUE: Multidetector CT imaging of the chest was performed following the standard protocol without IV contrast. COMPARISON:  Multiple chest x-rays since July 15, 2012 FINDINGS: Cardiovascular: Atherosclerotic change in the nonaneurysmal aorta. The central pulmonary arteries are normal in caliber. Coronary artery calcifications are noted. Mild cardiomegaly. Mediastinum/Nodes: Small right pleural effusion. No pericardial effusion. The esophagus and thyroid is normal. No adenopathy in the chest. Lungs/Pleura: Central airways are normal. No pneumothorax. There are some emphysematous changes in the lungs. Focal pulmonary infiltrates are seen bilaterally. The right infiltrates are most prominent in the right perihilar region, involving the upper and lower lobe. The infiltrate on the left is most prominent in the left lower lobe and posteriorly in the left upper lobe. There are some ground-glass opacities as well. Evaluation for nodules or masses is limited due to the diffuse opacities. Upper Abdomen: No acute abnormality. Musculoskeletal: There is an age-indeterminate compression fracture of T10, new since 2013. IMPRESSION: 1. Bilateral diffuse pulmonary infiltrates involving all lobes as above. There was significant worsening between January 30, 2018 and February 08, 2018. Given the rapid onset, the infiltrates are most likely infectious or inflammatory. There may be a mild interstitial background component but the interstitial component is not well assessed due to the diffuse infiltrates. Recommend follow-up to resolution. 2. There is a T10 compression fracture new since 2013 but otherwise age indeterminate. 3. Atherosclerotic change in the nonaneurysmal aorta. Coronary artery calcifications. 4. Emphysema. Aortic Atherosclerosis (ICD10-I70.0) and Emphysema (ICD10-J43.9). Electronically Signed   By: Gerome Sam III M.D   On: 02/10/2018 21:13   Dg Chest Port 1 View  Result Date: 02/26/2018 CLINICAL DATA:   Chest tube removal. EXAM: PORTABLE CHEST 1 VIEW COMPARISON:  Radiograph Feb 25, 2018. FINDINGS: Stable cardiomegaly. Right-sided chest tube has been removed. No definite pneumothorax is noted. Stable bilateral lower lobe airspace opacities are noted concerning for pneumonia. Minimal pleural effusions may be present. Bony thorax is unremarkable. IMPRESSION: No pneumothorax status post right-sided chest tube removal. Stable bilateral lung opacities are noted concerning for pneumonia. Electronically Signed   By: Lupita Raider, M.D.   On: 02/26/2018 09:50   Dg Chest Port 1 View  Result Date: 02/25/2018 CLINICAL DATA:  Shortness of breath, CHF, coronary artery disease, right-sided percutaneous pleural catheter for right pneumothorax. EXAM: PORTABLE CHEST 1 VIEW COMPARISON:  Portable chest x-ray of Feb 24, 2018 FINDINGS: A 5% or less right-sided apical pneumothorax is observed. The interstitial markings remain increased and are confluent in the right mid and lower lung. There is no pleural effusion. The lung markings rim main increased in the left mid and lower lung as  well. There is no significant pleural effusion on the left. The heart is top-normal in size. The pulmonary vascularity is not engorged. There is calcification in the wall of the aortic arch. IMPRESSION: Tiny right apical pneumothorax persists. Bilateral mid and lower lung interstitial infiltrates. No overt CHF. Thoracic aortic atherosclerosis. Electronically Signed   By: David  [place] M.D.   On: 02/25/2018 09:01   Dg Chest Port 1 View  Result Date: 02/24/2018 CLINICAL DATA:  Shortness of breath EXAM: PORTABLE CHEST 1 VIEW COMPARISON:  02/23/2018 FINDINGS: Cardiac shadow is within normal limits. Right-sided chest tube is again identified. The right pneumothorax has decreased in size when compare with the prior exam. Diffuse opacities are again identified and stable bilaterally. No new focal abnormality is seen. IMPRESSION: Stable bilateral  infiltrates. Reduction in right pneumothorax. Electronically Signed   By: Alcide Clever M.D.   On: 02/24/2018 07:34   Dg Chest Port 1 View  Result Date: 02/23/2018 CLINICAL DATA:  83 year old male status post spontaneous right pneumothorax and chest tube placement. EXAM: PORTABLE CHEST 1 VIEW COMPARISON:  0714 hours today and earlier. FINDINGS: Portable AP upright view at 1401 hours. Stable right chest pigtail type chest tube in place. Residual small right apical pneumothorax is stable since yesterday. Continued Patchy and confluent multifocal bilateral pulmonary opacity mostly about the hila. No pulmonary edema suspected. No definite pleural effusion. Stable cardiac size and mediastinal contours. Calcified aortic atherosclerosis. IMPRESSION: 1. Stable right chest tube and residual right apical pneumothorax. 2. Bilateral pneumonia suspected with no significant change since 02/20/2018. Electronically Signed   By: Odessa Fleming M.D.   On: 02/23/2018 14:14   Dg Chest Port 1 View  Result Date: 02/23/2018 CLINICAL DATA:  83 year old male with a history of right-sided pneumothorax status post thoracostomy EXAM: PORTABLE CHEST 1 VIEW COMPARISON:  02/22/2018, 02/20/2018 FINDINGS: Cardiomediastinal silhouette unchanged in size and contour. Similar appearance of mixed interstitial and airspace opacities in the bilateral mid and lower lungs. No new airspace opacity. Reticular opacities in the periphery with interlobular septal thickening. Unchanged position of right-sided pigtail thoracostomy tube. Likely small residual right apical pneumothorax. IMPRESSION: Likely small residual apical pneumothorax on the right with unchanged position of the thoracostomy tube. Similar appearance of mixed interstitial and airspace disease of the bilateral lungs. Electronically Signed   By: Gilmer Mor D.O.   On: 02/23/2018 08:36   Dg Chest Port 1 View  Result Date: 02/22/2018 CLINICAL DATA:  Right-sided pneumothorax, follow-up exam  EXAM: PORTABLE CHEST 1 VIEW COMPARISON:  02/22/2018 FINDINGS: Small bore pigtail catheter is now seen on the right. The right-sided pneumothorax is slightly reduced when compared with the prior exam. Excursion from the apex is approximately 3.6 cm decreased from 4.4 cm. Stable bibasilar changes are noted similar to that noted on the prior exam. No acute bony abnormality is noted. No other focal abnormality is seen. IMPRESSION: Slight reduction in right-sided pneumothorax. Stable bibasilar opacities are noted. Electronically Signed   By: Alcide Clever M.D.   On: 02/22/2018 19:51   Dg Chest Port 1 View  Result Date: 02/13/2018 CLINICAL DATA:  Shortness of breath. EXAM: PORTABLE CHEST 1 VIEW COMPARISON:  Radiograph of February 10, 2018. FINDINGS: Stable cardiomediastinal silhouette. No pneumothorax or pleural effusion is noted. Slightly improved diffuse interstitial and airspace opacities are noted throughout both lungs suggesting improving infection or edema. Bony thorax is unremarkable. IMPRESSION: Slightly improved bilateral diffuse lung opacities suggesting improving edema or infection. Electronically Signed   By: Lupita Raider, M.D.  On: 02/13/2018 09:22   Dg Chest Port 1 View  Result Date: 02/10/2018 CLINICAL DATA:  Hypoxemia EXAM: PORTABLE CHEST 1 VIEW COMPARISON:  02/09/2018 FINDINGS: Diffuse airspace disease and interstitial prominence throughout the lungs, stable. Mild cardiomegaly. No visible effusions or acute bony abnormality. IMPRESSION: Diffuse interstitial and alveolar opacities throughout the lungs could reflect edema or infection, unchanged. Electronically Signed   By: Charlett Nose M.D.   On: 02/10/2018 08:27   Dg Chest Port 1 View  Result Date: 02/09/2018 CLINICAL DATA:  Shortness of breath.  Hypoxemia. EXAM: PORTABLE CHEST 1 VIEW COMPARISON:  February 08, 2018 FINDINGS: Diffuse interstitial opacities throughout the right lung and the left mid and lower lung. No other interval changes.  IMPRESSION: Diffuse pulmonary opacities, right greater than left.  No change. Electronically Signed   By: Gerome Sam III M.D   On: 02/09/2018 15:48   Dg Chest Port 1 View  Result Date: 02/08/2018 CLINICAL DATA:  Acute respiratory failure.  Hypoxia. EXAM: PORTABLE CHEST 1 VIEW COMPARISON:  01/30/2018 FINDINGS: Cardiac enlargement. Diffuse interstitial pattern throughout the lungs. Previous studies demonstrate an underlying interstitial pattern but changes appear more prominent today. This suggest developing edema or interstitial pneumonitis superimposed upon chronic fibrosis. Blunting of the right costophrenic angle may suggest a small effusion. No pneumothorax. Mediastinal contours appear intact. Calcification of the aorta. IMPRESSION: Increasing diffuse interstitial pattern to the lungs likely representing developing edema or interstitial pneumonitis superimposed upon chronic fibrosis. Cardiac enlargement. Aortic atherosclerosis. Electronically Signed   By: Burman Nieves M.D.   On: 02/08/2018 22:55   Dg Chest Port 1 View  Result Date: 01/30/2018 CLINICAL DATA:  Bradycardia. EXAM: PORTABLE CHEST 1 VIEW COMPARISON:  Chest radiograph July 15, 2012 FINDINGS: Cardiac silhouette appears moderately enlarged. Mediastinal silhouette is nonsuspicious, calcified aortic knob. Similar chronic interstitial changes without pleural effusion or focal consolidation though the RIGHT costophrenic angle incompletely imaged. No pneumothorax. Soft tissue planes and included osseous structures are nonsuspicious. IMPRESSION: Stable cardiomegaly and chronic interstitial changes. Aortic Atherosclerosis (ICD10-I70.0). Electronically Signed   By: Awilda Metro M.D.   On: 01/30/2018 15:58   Ir Perc Pleural Drain W/indwell Cath W/img Guide  Result Date: 02/22/2018 INDICATION: 83 year old male with a spontaneous right pneumothorax. EXAM: IMAGE GUIDED RIGHT THORACOSTOMY TUBE PLACEMENT MEDICATIONS: The patient is currently  admitted to the hospital and receiving intravenous antibiotics. The antibiotics were administered within an appropriate time frame prior to the initiation of the procedure. ANESTHESIA/SEDATION: Fentanyl 50 mcg IV; Versed 0 mg IV NONE. COMPLICATIONS: NONE PROCEDURE: The procedure, risks, benefits, and alternatives were explained to the patient/patient's family, who provided informed consent on the patient's behalf. Specific risks that were addressed included bleeding, infection, ongoing pneumothorax, need for further procedure/surgery, chance of hemorrhage, hemoptysis, cardiopulmonary collapse, death. Questions regarding the procedure were encouraged and answered. The patient understands and consents to the procedure. Patient was positioned in the right anterior oblique position on the IR table and scout image of the chest was performed for planning purposes. The right mid axillary line at the level of the nipple was identified, and prepped and draped in the usual sterile fashion. The skin and subcutaneous tissues were generously infiltrated 1% lidocaine for local anesthesia. A Yueh needle was then used to enter the pleural space with aspiration of air. The plastic Yueh catheter was advanced into the pleural space and an 035 guidewire was advanced to the apex of the lung under fluoroscopy. Dilation of the skin tract was performed over the wire, and then modified  Seldinger technique was used to place a 10 French pigtail catheter into the anterior pleural space. Catheter was attached to water seal chamber and suction was applied confirming a operational chest tube. Retention suture was placed.  Sterile dressing was placed. Patient tolerated the procedure well and remained hemodynamically stable throughout. No complications were encountered and no significant blood loss was encounter IMPRESSION: Status post right thoracostomy tube placed. Signed, Yvone Neu. Loreta Ave, DO [facility]  [facility] Electronically Signed   By: Gilmer Mor D.O.   On: 02/22/2018 16:27     TODAY-DAY OF DISCHARGE:  Subjective:   Craig [name] today has no headache,no chest abdominal pain,no new weakness tingling or numbness, feels much better wants to go home today.   Objective:   Blood pressure 114/66, pulse 99, temperature 97.8 F (36.6 C), temperature source Oral, resp. rate (!) 22, height  (1.854 m), weight 75 kg (165 lb 4.8 oz), SpO2 95 %.  Intake/Output Summary (Last 24 hours) at 02/26/2018 1035 Last data filed at 02/26/2018 0545 Gross per 24 hour  Intake 702 ml  Output 1600 ml  Net -898 ml   Filed Weights   02/24/18 0541 02/25/18 0456 02/26/18 0538  Weight: 66.5 kg (146 lb 9.6 oz) 74.8 kg (164 lb 14.4 oz) 75 kg (165 lb 4.8 oz)    Exam: Awake Alert, Oriented *3, No new F.N deficits, Normal affect [place].AT,PERRAL Supple Neck,No JVD, No cervical lymphadenopathy appriciated.  Symmetrical Chest wall movement, Good air movement bilaterally, CTAB RRR,No Gallops,Rubs or new Murmurs, No Parasternal Heave +ve B.Sounds, Abd Soft, Non tender, No organomegaly appriciated, No rebound -guarding or rigidity. No Cyanosis, Clubbing or edema, No new Rash or bruise   PERTINENT RADIOLOGIC STUDIES: Dg Chest 2 View  Result Date: 02/22/2018 CLINICAL DATA:  Hypoxia EXAM: CHEST - 2 VIEW COMPARISON:  02/20/2018 FINDINGS: Moderate-sized right pneumothorax measuring 4.4 cm at the apex. Pneumothorax measures approximately 20%. Bilateral interstitial thickening. Bilateral lower lobe airspace disease. Trace left pleural effusion. Stable cardiomediastinal silhouette. No acute osseous abnormality. IMPRESSION: 1. Moderate-sized right pneumothorax measuring approximately 20%. Critical Value/emergent results were called by telephone at the time of interpretation on 02/22/2018 at 2:16 pm to [facility] , who verbally acknowledged these results. 2. Bilateral lower lobe airspace disease most concerning  for pneumonia. Electronically Signed   By: Elige Ko   On: 02/22/2018 14:18   Dg Chest 2 View  Result Date: 02/20/2018 CLINICAL DATA:  Shortness of breath, atrial fibrillation, coronary artery disease EXAM: CHEST - 2 VIEW COMPARISON:  02/13/2018 FINDINGS: Upper normal heart size. Atherosclerotic calcification aorta. Mediastinal contours normal. Asymmetric pulmonary infiltrates RIGHT greater than LEFT question pneumonia versus asymmetric edema. No definite pleural effusion or pneumothorax. Bones demineralized. IMPRESSION: Persistent diffuse BILATERAL pulmonary infiltrates question infection versus asymmetric pulmonary edema, little changed. Electronically Signed   By: Ulyses Southward M.D.   On: 02/20/2018 10:52   Ct Chest Wo Contrast  Result Date: 02/10/2018 CLINICAL DATA:  Interstitial lung disease. EXAM: CT CHEST WITHOUT CONTRAST TECHNIQUE: Multidetector CT imaging of the chest was performed following the standard protocol without IV contrast. COMPARISON:  Multiple chest x-rays since July 15, 2012 FINDINGS: Cardiovascular: Atherosclerotic change in the nonaneurysmal aorta. The central pulmonary arteries are normal in caliber. Coronary artery calcifications are noted. Mild cardiomegaly. Mediastinum/Nodes: Small right pleural effusion. No pericardial effusion. The esophagus and thyroid is normal. No adenopathy in the chest. Lungs/Pleura: Central airways are normal. No pneumothorax. There are some emphysematous changes in the lungs.  Focal pulmonary infiltrates are seen bilaterally. The right infiltrates are most prominent in the right perihilar region, involving the upper and lower lobe. The infiltrate on the left is most prominent in the left lower lobe and posteriorly in the left upper lobe. There are some ground-glass opacities as well. Evaluation for nodules or masses is limited due to the diffuse opacities. Upper Abdomen: No acute abnormality. Musculoskeletal: There is an age-indeterminate compression  fracture of T10, new since 2013. IMPRESSION: 1. Bilateral diffuse pulmonary infiltrates involving all lobes as above. There was significant worsening between January 30, 2018 and February 08, 2018. Given the rapid onset, the infiltrates are most likely infectious or inflammatory. There may be a mild interstitial background component but the interstitial component is not well assessed due to the diffuse infiltrates. Recommend follow-up to resolution. 2. There is a T10 compression fracture new since 2013 but otherwise age indeterminate. 3. Atherosclerotic change in the nonaneurysmal aorta. Coronary artery calcifications. 4. Emphysema. Aortic Atherosclerosis (ICD10-I70.0) and Emphysema (ICD10-J43.9). Electronically Signed   By: Gerome Sam III M.D   On: 02/10/2018 21:13   Dg Chest Port 1 View  Result Date: 02/26/2018 CLINICAL DATA:  Chest tube removal. EXAM: PORTABLE CHEST 1 VIEW COMPARISON:  Radiograph Feb 25, 2018. FINDINGS: Stable cardiomegaly. Right-sided chest tube has been removed. No definite pneumothorax is noted. Stable bilateral lower lobe airspace opacities are noted concerning for pneumonia. Minimal pleural effusions may be present. Bony thorax is unremarkable. IMPRESSION: No pneumothorax status post right-sided chest tube removal. Stable bilateral lung opacities are noted concerning for pneumonia. Electronically Signed   By: Lupita Raider, M.D.   On: 02/26/2018 09:50   Dg Chest Port 1 View  Result Date: 02/25/2018 CLINICAL DATA:  Shortness of breath, CHF, coronary artery disease, right-sided percutaneous pleural catheter for right pneumothorax. EXAM: PORTABLE CHEST 1 VIEW COMPARISON:  Portable chest x-ray of Feb 24, 2018 FINDINGS: A 5% or less right-sided apical pneumothorax is observed. The interstitial markings remain increased and are confluent in the right mid and lower lung. There is no pleural effusion. The lung markings rim main increased in the left mid and lower lung as well. There is no  significant pleural effusion on the left. The heart is top-normal in size. The pulmonary vascularity is not engorged. There is calcification in the wall of the aortic arch. IMPRESSION: Tiny right apical pneumothorax persists. Bilateral mid and lower lung interstitial infiltrates. No overt CHF. Thoracic aortic atherosclerosis. Electronically Signed   By: David  [place] M.D.   On: 02/25/2018 09:01   Dg Chest Port 1 View  Result Date: 02/24/2018 CLINICAL DATA:  Shortness of breath EXAM: PORTABLE CHEST 1 VIEW COMPARISON:  02/23/2018 FINDINGS: Cardiac shadow is within normal limits. Right-sided chest tube is again identified. The right pneumothorax has decreased in size when compare with the prior exam. Diffuse opacities are again identified and stable bilaterally. No new focal abnormality is seen. IMPRESSION: Stable bilateral infiltrates. Reduction in right pneumothorax. Electronically Signed   By: Alcide Clever M.D.   On: 02/24/2018 07:34   Dg Chest Port 1 View  Result Date: 02/23/2018 CLINICAL DATA:  83 year old male status post spontaneous right pneumothorax and chest tube placement. EXAM: PORTABLE CHEST 1 VIEW COMPARISON:  0714 hours today and earlier. FINDINGS: Portable AP upright view at 1401 hours. Stable right chest pigtail type chest tube in place. Residual small right apical pneumothorax is stable since yesterday. Continued Patchy and confluent multifocal bilateral pulmonary opacity mostly about the hila. No pulmonary edema  suspected. No definite pleural effusion. Stable cardiac size and mediastinal contours. Calcified aortic atherosclerosis. IMPRESSION: 1. Stable right chest tube and residual right apical pneumothorax. 2. Bilateral pneumonia suspected with no significant change since 02/20/2018. Electronically Signed   By: Odessa Fleming M.D.   On: 02/23/2018 14:14   Dg Chest Port 1 View  Result Date: 02/23/2018 CLINICAL DATA:  83 year old male with a history of right-sided pneumothorax status post  thoracostomy EXAM: PORTABLE CHEST 1 VIEW COMPARISON:  02/22/2018, 02/20/2018 FINDINGS: Cardiomediastinal silhouette unchanged in size and contour. Similar appearance of mixed interstitial and airspace opacities in the bilateral mid and lower lungs. No new airspace opacity. Reticular opacities in the periphery with interlobular septal thickening. Unchanged position of right-sided pigtail thoracostomy tube. Likely small residual right apical pneumothorax. IMPRESSION: Likely small residual apical pneumothorax on the right with unchanged position of the thoracostomy tube. Similar appearance of mixed interstitial and airspace disease of the bilateral lungs. Electronically Signed   By: Gilmer Mor D.O.   On: 02/23/2018 08:36   Dg Chest Port 1 View  Result Date: 02/22/2018 CLINICAL DATA:  Right-sided pneumothorax, follow-up exam EXAM: PORTABLE CHEST 1 VIEW COMPARISON:  02/22/2018 FINDINGS: Small bore pigtail catheter is now seen on the right. The right-sided pneumothorax is slightly reduced when compared with the prior exam. Excursion from the apex is approximately 3.6 cm decreased from 4.4 cm. Stable bibasilar changes are noted similar to that noted on the prior exam. No acute bony abnormality is noted. No other focal abnormality is seen. IMPRESSION: Slight reduction in right-sided pneumothorax. Stable bibasilar opacities are noted. Electronically Signed   By: Alcide Clever M.D.   On: 02/22/2018 19:51   Dg Chest Port 1 View  Result Date: 02/13/2018 CLINICAL DATA:  Shortness of breath. EXAM: PORTABLE CHEST 1 VIEW COMPARISON:  Radiograph of February 10, 2018. FINDINGS: Stable cardiomediastinal silhouette. No pneumothorax or pleural effusion is noted. Slightly improved diffuse interstitial and airspace opacities are noted throughout both lungs suggesting improving infection or edema. Bony thorax is unremarkable. IMPRESSION: Slightly improved bilateral diffuse lung opacities suggesting improving edema or infection.  Electronically Signed   By: Lupita Raider, M.D.   On: 02/13/2018 09:22   Dg Chest Port 1 View  Result Date: 02/10/2018 CLINICAL DATA:  Hypoxemia EXAM: PORTABLE CHEST 1 VIEW COMPARISON:  02/09/2018 FINDINGS: Diffuse airspace disease and interstitial prominence throughout the lungs, stable. Mild cardiomegaly. No visible effusions or acute bony abnormality. IMPRESSION: Diffuse interstitial and alveolar opacities throughout the lungs could reflect edema or infection, unchanged. Electronically Signed   By: Charlett Nose M.D.   On: 02/10/2018 08:27   Dg Chest Port 1 View  Result Date: 02/09/2018 CLINICAL DATA:  Shortness of breath.  Hypoxemia. EXAM: PORTABLE CHEST 1 VIEW COMPARISON:  February 08, 2018 FINDINGS: Diffuse interstitial opacities throughout the right lung and the left mid and lower lung. No other interval changes. IMPRESSION: Diffuse pulmonary opacities, right greater than left.  No change. Electronically Signed   By: Gerome Sam III M.D   On: 02/09/2018 15:48   Dg Chest Port 1 View  Result Date: 02/08/2018 CLINICAL DATA:  Acute respiratory failure.  Hypoxia. EXAM: PORTABLE CHEST 1 VIEW COMPARISON:  01/30/2018 FINDINGS: Cardiac enlargement. Diffuse interstitial pattern throughout the lungs. Previous studies demonstrate an underlying interstitial pattern but changes appear more prominent today. This suggest developing edema or interstitial pneumonitis superimposed upon chronic fibrosis. Blunting of the right costophrenic angle may suggest a small effusion. No pneumothorax. Mediastinal contours appear intact. Calcification of  the aorta. IMPRESSION: Increasing diffuse interstitial pattern to the lungs likely representing developing edema or interstitial pneumonitis superimposed upon chronic fibrosis. Cardiac enlargement. Aortic atherosclerosis. Electronically Signed   By: Burman Nieves M.D.   On: 02/08/2018 22:55   Dg Chest Port 1 View  Result Date: 01/30/2018 CLINICAL DATA:  Bradycardia.  EXAM: PORTABLE CHEST 1 VIEW COMPARISON:  Chest radiograph July 15, 2012 FINDINGS: Cardiac silhouette appears moderately enlarged. Mediastinal silhouette is nonsuspicious, calcified aortic knob. Similar chronic interstitial changes without pleural effusion or focal consolidation though the RIGHT costophrenic angle incompletely imaged. No pneumothorax. Soft tissue planes and included osseous structures are nonsuspicious. IMPRESSION: Stable cardiomegaly and chronic interstitial changes. Aortic Atherosclerosis (ICD10-I70.0). Electronically Signed   By: Awilda Metro M.D.   On: 01/30/2018 15:58   Ir Perc Pleural Drain W/[place] Cath W/img Guide  Result Date: 02/22/2018 INDICATION: 83 year old male with a spontaneous right pneumothorax. EXAM: IMAGE GUIDED RIGHT THORACOSTOMY TUBE PLACEMENT MEDICATIONS: The patient is currently admitted to the hospital and receiving intravenous antibiotics. The antibiotics were administered within an appropriate time frame prior to the initiation of the procedure. ANESTHESIA/SEDATION: Fentanyl 50 mcg IV; Versed 0 mg IV NONE. COMPLICATIONS: NONE PROCEDURE: The procedure, risks, benefits, and alternatives were explained to the patient/patient's family, who provided informed consent on the patient's behalf. Specific risks that were addressed included bleeding, infection, ongoing pneumothorax, need for further procedure/surgery, chance of hemorrhage, hemoptysis, cardiopulmonary collapse, death. Questions regarding the procedure were encouraged and answered. The patient understands and consents to the procedure. Patient was positioned in the right anterior oblique position on the IR table and scout image of the chest was performed for planning purposes. The right mid axillary line at the level of the nipple was identified, and prepped and draped in the usual sterile fashion. The skin and subcutaneous tissues were generously infiltrated 1% lidocaine for local anesthesia. A Yueh needle  was then used to enter the pleural space with aspiration of air. The plastic Yueh catheter was advanced into the pleural space and an 035 guidewire was advanced to the apex of the lung under fluoroscopy. Dilation of the skin tract was performed over the wire, and then modified Seldinger technique was used to place a 10 French pigtail catheter into the anterior pleural space. Catheter was attached to water seal chamber and suction was applied confirming a operational chest tube. Retention suture was placed.  Sterile dressing was placed. Patient tolerated the procedure well and remained hemodynamically stable throughout. No complications were encountered and no significant blood loss was encounter IMPRESSION: Status post right thoracostomy tube placed. Signed, Yvone Neu. Loreta Ave, DO [facility] Electronically Signed   By: Gilmer Mor D.O.   On: 02/22/2018 16:27     PERTINENT LAB RESULTS: CBC: Recent Labs    02/24/18 0746  WBC 15.7*  HGB 17.0  HCT 49.8  PLT 183   CMET CMP     Component Value Date/Time   NA 136 02/26/2018 0426   K 4.0 02/26/2018 0426   CL 95 (L) 02/26/2018 0426   CO2 31 02/26/2018 0426   GLUCOSE 126 (H) 02/26/2018 0426   BUN 42 (H) 02/26/2018 0426   CREATININE 1.31 (H) 02/26/2018 0426   CALCIUM 9.4 02/26/2018 0426   PROT 6.2 (L) 02/15/2018 0452   ALBUMIN 2.7 (L) 02/15/2018 0452   AST 22 02/15/2018 0452   ALT 18 02/15/2018 0452   ALKPHOS 87 02/15/2018 0452   BILITOT 1.1 02/15/2018 0452   GFRNONAA 50 (L)  02/26/2018 0426   GFRAA 58 (L) 02/26/2018 0426    GFR Estimated Creatinine Clearance: 47.7 mL/min (A) (by C-G formula based on SCr of 1.31 mg/dL (H)). No results for input(s): LIPASE, AMYLASE in the last 72 hours. No results for input(s): CKTOTAL, CKMB, CKMBINDEX, TROPONINI in the last 72 hours. Invalid input(s): POCBNP No results for input(s): DDIMER in the last 72 hours. No results for input(s): HGBA1C in  the last 72 hours. No results for input(s): CHOL, HDL, LDLCALC, TRIG, CHOLHDL, LDLDIRECT in the last 72 hours. No results for input(s): TSH, T4TOTAL, T3FREE, THYROIDAB in the last 72 hours.  Invalid input(s): FREET3 No results for input(s): VITAMINB12, FOLATE, FERRITIN, TIBC, IRON, RETICCTPCT in the last 72 hours. Coags: No results for input(s): INR in the last 72 hours.  Invalid input(s): PT Microbiology: No results found for this or any previous visit (from the past 240 hour(s)).  FURTHER DISCHARGE INSTRUCTIONS:  Get Medicines reviewed and adjusted: Please take all your medications with you for your next visit with your Primary MD  Laboratory/radiological data: Please request your Primary MD to go over all hospital tests and procedure/radiological results at the follow up, please ask your Primary MD to get all Hospital records sent to his/her office.  In some cases, they will be blood work, cultures and biopsy results pending at the time of your discharge. Please request that your primary care M.D. goes through all the records of your hospital data and follows up on these results.  Also Note the following: If you experience worsening of your admission symptoms, develop shortness of breath, life threatening emergency, suicidal or homicidal thoughts you must seek medical attention immediately by calling 911 or calling your MD immediately  if symptoms less severe.  You must read complete instructions/literature along with all the possible adverse reactions/side effects for all the Medicines you take and that have been prescribed to you. Take any new Medicines after you have completely understood and accpet all the possible adverse reactions/side effects.   Do not drive when taking Pain medications or sleeping medications (Benzodaizepines)  Do not take more than prescribed Pain, Sleep and Anxiety Medications. It is not advisable to combine anxiety,sleep and pain medications without  talking with your primary care practitioner  Special Instructions: If you have smoked or chewed Tobacco  in the last 2 yrs please stop smoking, stop any regular Alcohol  and or any Recreational drug use.  Wear Seat belts while driving.  Please note: You were cared for by a hospitalist during your hospital stay. Once you are discharged, your primary care physician will handle any further medical issues. Please note that NO REFILLS for any discharge medications will be authorized once you are discharged, as it is imperative that you return to your primary care physician (or establish a relationship with a primary care physician if you do not have one) for your post hospital discharge needs so that they can reassess your need for medications and monitor your lab values.  Total Time spent coordinating discharge including counseling, education and face to face time equals 45 minutes.  SignedJeoffrey Massed 02/26/2018 10:35 AM

## 2018-02-26 NOTE — Progress Notes (Signed)
[facility] PCCM Progress Note   BRIEF 83 year old remote smoker with recent NSTEMI and RCA occlusion on cath admitted 4/27 with elevated troponin and acute hypoxic respiratory failure.  CT removed yesterday without difficulty  02/13/18 and 02/17/18 Results for [facility] (MRN [id number]) as of 02/22/2018 14:58  Ref. Range 02/13/2018 18:18 02/17/2018 11:02  ANA Ab, IFA Unknown  Negative  ANCA Proteinase 3 Latest Ref Range: 0.0 - 3.5 U/mL  <3.5  Anti JO-1 Latest Ref Range: 0.0 - 0.9 AI <0.2   CCP Antibodies IgG/IgA Latest Ref Range: 0 - 19 units 6   Myeloperoxidase Abs Latest Ref Range: 0.0 - 9.0 U/mL  <9.0  RA Latex Turbid. Latest Ref Range: 0.0 - 13.9 IU/mL  <10.0  Cytoplasmic (C-ANCA) Latest Ref Range: Neg:<1:20 titer  <1:20  P-ANCA Latest Ref Range: Neg:<1:20 titer  <1:20  Atypical P-ANCA titer Latest Ref Range: Neg:<1:20 titer  <1:20  ENA SM Ab Ser-aCnc Latest Ref Range: 0.0 - 0.9 AI <0.2   Ribonucleic Protein Latest Ref Range: 0.0 - 0.9 AI <0.2     02/18/18  He is on 10 L high flow nasal cannula this morning.  Denies SOB, chest pain.  Actually states that he feels somewhat better. 5/11  S/p Right PTX 5/11 s/p chest tube by IR .  02/23/18 - resting on 2l/m [place] . Says breathing is doing better.  02/24/2018 - CT to waterseal  SUBJECTIVE:  Feels about the same, on 2L [place]  OBJECTIVE:  General:  Thin male, in bed, NAD HEENT: [place]/AT, PERRL, EOM-I and MMM Neuro: Alert and oriented, moving all ext to command CV: IRIR, Nl S1/S2 and -M/R/G PULM: Distant and quite but present and clear GI: Soft, NT, ND and +BS Extremities: -edema and -tenderness Skin: Intact, bruising noted  PULMONARY No results for input(s): PHART, PCO2ART, PO2ART, HCO3, TCO2, O2SAT in the last 168 hours.  Invalid input(s): PCO2, PO2  CBC Recent Labs  Lab 02/22/18 0611 02/23/18 0427 02/24/18 0746  HGB 16.0 17.2* 17.0  HCT 47.0 49.4 49.8  WBC 17.0* 15.6* 15.7*  PLT 224 207 183    COAGULATION No results for  input(s): INR in the last 168 hours.  CARDIAC  No results for input(s): TROPONINI in the last 168 hours. No results for input(s): PROBNP in the last 168 hours.   CHEMISTRY Recent Labs  Lab 02/21/18 0507 02/22/18 0611 02/23/18 0427 02/24/18 0746 02/26/18 0426  NA 136 140 140 140 136  K 3.8 3.3* 3.8 4.0 4.0  CL 96* 101 101 102 95*  CO2 GLUCOSE 124* 108* 106* 96 126*  BUN 44* 42* 42* 43* 42*  CREATININE 1.48* 1.40* 1.36* 1.51* 1.31*  CALCIUM 9.5 9.4 9.5 9.4 9.4   Estimated Creatinine Clearance: 47.7 mL/min (A) (by C-G formula based on SCr of 1.31 mg/dL (H)).   LIVER No results for input(s): AST, ALT, ALKPHOS, BILITOT, PROT, ALBUMIN, INR in the last 168 hours.   INFECTIOUS No results for input(s): LATICACIDVEN, PROCALCITON in the last 168 hours.   ENDOCRINE CBG (last 3)  Recent Labs    02/25/18 2147 02/26/18 0801 02/26/18 1204  GLUCAP 125* 98 158*    IMAGING x48h  - image(s) personally visualized  -   highlighted in bold Dg Chest Port 1 View  Result Date: 02/26/2018 CLINICAL DATA:  Chest tube removal. EXAM: PORTABLE CHEST 1 VIEW COMPARISON:  Radiograph Feb 25, 2018. FINDINGS: Stable cardiomegaly. Right-sided chest tube has been removed. No definite pneumothorax is  noted. Stable bilateral lower lobe airspace opacities are noted concerning for pneumonia. Minimal pleural effusions may be present. Bony thorax is unremarkable. IMPRESSION: No pneumothorax status post right-sided chest tube removal. Stable bilateral lung opacities are noted concerning for pneumonia. Electronically Signed   By: Lupita Raider, M.D.   On: 02/26/2018 09:50   Dg Chest Port 1 View  Result Date: 02/25/2018 CLINICAL DATA:  Shortness of breath, CHF, coronary artery disease, right-sided percutaneous pleural catheter for right pneumothorax. EXAM: PORTABLE CHEST 1 VIEW COMPARISON:  Portable chest x-ray of Feb 24, 2018 FINDINGS: A 5% or less right-sided apical pneumothorax is observed.  The interstitial markings remain increased and are confluent in the right mid and lower lung. There is no pleural effusion. The lung markings rim main increased in the left mid and lower lung as well. There is no significant pleural effusion on the left. The heart is top-normal in size. The pulmonary vascularity is not engorged. There is calcification in the wall of the aortic arch. IMPRESSION: Tiny right apical pneumothorax persists. Bilateral mid and lower lung interstitial infiltrates. No overt CHF. Thoracic aortic atherosclerosis. Electronically Signed   By: David  [place] M.D.   On: 02/25/2018 09:01   I reviewed CXR myself, CT out and PTX stable.  Assessment and Plan  83 year old male with ILD and spontaneous PTX, CT placed to water seal on 5/13 and repeat CXR with no changes and no leak.  Discussed with PCCM-NP.  ILD:  - Complete a week of prednisone 50 mg PO daily then begin a 2 week taper of prednisone  - Will need f/u with pulmonary locally in [place]  Hypoxemia:  - Titrate O2 for sat of 88-92%  - Will need O2 in SNF for sats above  PTX: no air leak  - CXR PRN at this point, only if symptoms.  PCCM will sign off, please call back if needed.  Alyson Reedy, M.D. [facility] Pulmonary/Critical Care Medicine. Pager: [phone]. After hours pager: [phone].

## 2018-02-26 NOTE — Progress Notes (Signed)
Progress Note  Patient Name: Craig [name] Date of Encounter: 02/26/2018  Primary Cardiologist: Nona Dell, MD   Subjective   No CP; + DOE  Inpatient Medications    Scheduled Meds: . allopurinol  300 mg Oral QPM  . apixaban  5 mg Oral BID  . aspirin EC  81 mg Oral Daily  . atorvastatin  20 mg Oral q1800  . cefdinir  300 mg Oral Q12H  . feeding supplement (ENSURE ENLIVE)  237 mL Oral TID BM  . furosemide  40 mg Oral Daily  . gemfibrozil  600 mg Oral BID AC  . guaiFENesin  600 mg Oral BID  . insulin aspart  0-9 Units Subcutaneous TID WC  . ipratropium  0.5 mg Nebulization TID  . levalbuterol  1.25 mg Nebulization TID  . liver oil-zinc oxide   Topical q morning - 10a  . metoprolol succinate  37.5 mg Oral Daily  . nystatin  5 mL Oral QID  . predniSONE  50 mg Oral Q breakfast  . senna-docusate  1 tablet Oral BID  . sodium chloride flush  3 mL Intravenous Q12H  . tamsulosin  0.4 mg Oral QPC supper   Continuous Infusions: . sodium chloride 250 mL (02/19/18 1437)   PRN Meds: sodium chloride, acetaminophen, ALPRAZolam, artificial tears, levalbuterol, magnesium hydroxide, menthol-cetylpyridinium, ondansetron (ZOFRAN) IV, phenol, sodium chloride, sodium chloride flush, zolpidem   Vital Signs    Vitals:   02/25/18 2333 02/26/18 0538 02/26/18 0803 02/26/18 0840  BP: 119/85 114/74 114/66   Pulse: 99 91 86 99  Resp: (!) 24 (!) 21 (!) 22   Temp: 97.8 F (36.6 C)     TempSrc: Oral  Oral   SpO2: 96% 96% 95%   Weight:  165 lb 4.8 oz (75 kg)    Height:        Intake/Output Summary (Last 24 hours) at 02/26/2018 0857 Last data filed at 02/26/2018 0545 Gross per 24 hour  Intake 942 ml  Output 1600 ml  Net -658 ml   Filed Weights   02/24/18 0541 02/25/18 0456 02/26/18 0538  Weight: 146 lb 9.6 oz (66.5 kg) 164 lb 14.4 oz (74.8 kg) 165 lb 4.8 oz (75 kg)    Telemetry    Atrial fibrillation with NSVT (longest 5 beats)- Personally Reviewed   Physical Exam   GEN: WD  chronically ill appearing Neck: Supple Cardiac: irregular Respiratory: continued basilar crackles GI: Soft, NT/ND, no masses MS: No edema or chords Neuro:  No focal findings   Labs    Chemistry Recent Labs  Lab 02/23/18 0427 02/24/18 0746 02/26/18 0426  NA 140 140 136  K 3.8 4.0 4.0  CL 101 102 95*  CO2 GLUCOSE 106* 96 126*  BUN 42* 43* 42*  CREATININE 1.36* 1.51* 1.31*  CALCIUM 9.5 9.4 9.4  GFRNONAA 48* 42* 50*  GFRAA 55* 49* 58*  ANIONGAP Hematology Recent Labs  Lab 02/22/18 0611 02/23/18 0427 02/24/18 0746  WBC 17.0* 15.6* 15.7*  RBC 4.69 4.89 4.94  HGB 16.0 17.2* 17.0  HCT 47.0 49.4 49.8  MCV 100.2* 101.0* 100.8*  MCH 34.1* 35.2* 34.4*  MCHC 34.0 34.8 34.1  RDW 14.0 14.2 14.3  PLT 224 207 183    Radiology    Dg Chest Port 1 View  Result Date: 02/25/2018 CLINICAL DATA:  Shortness of breath, CHF, coronary artery disease, right-sided percutaneous pleural catheter for right pneumothorax. EXAM: PORTABLE CHEST  1 VIEW COMPARISON:  Portable chest x-ray of Feb 24, 2018 FINDINGS: A 5% or less right-sided apical pneumothorax is observed. The interstitial markings remain increased and are confluent in the right mid and lower lung. There is no pleural effusion. The lung markings rim main increased in the left mid and lower lung as well. There is no significant pleural effusion on the left. The heart is top-normal in size. The pulmonary vascularity is not engorged. There is calcification in the wall of the aortic arch. IMPRESSION: Tiny right apical pneumothorax persists. Bilateral mid and lower lung interstitial infiltrates. No overt CHF. Thoracic aortic atherosclerosis. Electronically Signed   By: David  Swaziland M.D.   On: 02/25/2018 09:01    Patient Profile     83 y.o. male with PMH of CAD s/p recent NSTEMI 01/31/18 (RCA occlusion medically managed), permanent atrial fibrillation, CKD stage III, who presented with syncope and found to have acute  hypoxic respiratory failure (CHF exacerbation vs aspiration). Echo with contrast on this admission is consistent with apical variant hypertrophic cardiomyopathy.  Assessment & Plan    1 acute hypoxic respiratory failure-dyspnea mildly improved.  He is not volume overloaded on examination.  Will continue low-dose Lasix to keep on dry side.  Appears to be predominantly inflammatory process; continue steroids.   2 pneumothorax-chest tube now removed.  3 permanent atrial fibrillation-continue beta-blocker for rate control.  Continue apixaban. Cr improved to 1.31; continue 5 mg BID.  4 apical variant hypertrophic cardiomyopathy-we will continue with present dose of beta-blockade.    5 coronary artery disease status post recent non-ST elevation myocardial infarction-cardiac catheterization revealed occluded RCA managed medically. No recurrent chest pain. Continue medical therapy with aspirin, statin and beta-blocker.  6 chronic stage III kidney disease-Renal function stable this AM.  For questions or updates, please contact [facility] Please consult [email or web address] for contact info under Cardiology/STEMI.      Signed, Olga Millers, MD  02/26/2018, 8:57 AM

## 2018-02-26 NOTE — Clinical Social Work Placement (Signed)
   CLINICAL SOCIAL WORK PLACEMENT  NOTE  Date:  02/26/2018  Patient Details  Name: Craig [name] MRN: [id number] Date of Birth: [birth year]  Clinical Social Work is seeking post-discharge placement for this patient at the Skilled  Nursing Facility level of care (*CSW will initial, date and re-position this form in  chart as items are completed):  Yes   Patient/family provided with [facility] Clinical Social Work Department's list of facilities offering this level of care within the geographic area requested by the patient (or if unable, by the patient's family).  Yes   Patient/family informed of their freedom to choose among providers that offer the needed level of care, that participate in Medicare, Medicaid or managed care program needed by the patient, have an available bed and are willing to accept the patient.  Yes   Patient/family informed of [facility]'s ownership interest in [facility] and [facility], as well as of the fact that they are under no obligation to receive care at these facilities.  PASRR submitted to EDS on       PASRR number received on       Existing PASRR number confirmed on       FL2 transmitted to all facilities in geographic area requested by pt/family on 02/17/18     FL2 transmitted to all facilities within larger geographic area on       Patient informed that his/her managed care company has contracts with or will negotiate with certain facilities, including the following:  [facility] and [facility]     Yes   Patient/family informed of bed offers received.  Patient chooses bed at [facility] and [facility]     Physician recommends and patient chooses bed at      Patient to be [facility] on 02/26/18.  Patient to be transferred to facility by [facility]     Patient family notified on 02/26/18 of transfer.  Name of family member notified:  Dudley Major, sister in law      PHYSICIAN Please prepare priority discharge summary, including medications, Please prepare prescriptions, Please sign FL2     Additional Comment:    _______________________________________________ Abigail Butts, LCSW 02/26/2018, 10:14 AM

## 2018-02-26 NOTE — Care Management Important Message (Signed)
Important Message  Patient Details  Name: Craig [name] MRN: [id number] Date of Birth: 1934-11-24   Medicare Important Message Given:  Yes    [name] P [name] 02/26/2018, 3:01 PM

## 2018-02-26 NOTE — Progress Notes (Signed)
Patient will discharge to [facility] in [place], [place]  Anticipated discharge date: 02/26/18 Family notified: Dudley Major, sister in law Transportation by: Sharin Mons - pickup scheduled for 1 pm  Nurse to call report to [phone]. Patient will go to room 213B at the facility.   CSW signing off.  Abigail Butts, LCSWA  Clinical Social Worker

## 2018-03-11 ENCOUNTER — Institutional Professional Consult (permissible substitution): Payer: Medicare Other | Admitting: Internal Medicine

## 2018-03-14 ENCOUNTER — Other Ambulatory Visit (HOSPITAL_COMMUNITY): Payer: Medicare Other

## 2018-03-14 ENCOUNTER — Inpatient Hospital Stay
Admission: RE | Admit: 2018-03-14 | Discharge: 2018-04-14 | Disposition: E | Payer: Medicare Other | Source: Other Acute Inpatient Hospital | Attending: Internal Medicine | Admitting: Internal Medicine

## 2018-03-14 DIAGNOSIS — J189 Pneumonia, unspecified organism: Secondary | ICD-10-CM

## 2018-03-14 DIAGNOSIS — I214 Non-ST elevation (NSTEMI) myocardial infarction: Secondary | ICD-10-CM | POA: Diagnosis present

## 2018-03-14 DIAGNOSIS — J9690 Respiratory failure, unspecified, unspecified whether with hypoxia or hypercapnia: Secondary | ICD-10-CM

## 2018-03-14 DIAGNOSIS — Z4659 Encounter for fitting and adjustment of other gastrointestinal appliance and device: Secondary | ICD-10-CM

## 2018-03-14 DIAGNOSIS — Z9911 Dependence on respirator [ventilator] status: Secondary | ICD-10-CM

## 2018-03-14 DIAGNOSIS — J441 Chronic obstructive pulmonary disease with (acute) exacerbation: Secondary | ICD-10-CM

## 2018-03-14 DIAGNOSIS — J849 Interstitial pulmonary disease, unspecified: Secondary | ICD-10-CM | POA: Diagnosis present

## 2018-03-14 DIAGNOSIS — I4821 Permanent atrial fibrillation: Secondary | ICD-10-CM | POA: Diagnosis present

## 2018-03-14 DIAGNOSIS — N183 Chronic kidney disease, stage 3 unspecified: Secondary | ICD-10-CM | POA: Diagnosis present

## 2018-03-14 DIAGNOSIS — J9621 Acute and chronic respiratory failure with hypoxia: Secondary | ICD-10-CM

## 2018-03-14 DIAGNOSIS — R0902 Hypoxemia: Secondary | ICD-10-CM

## 2018-03-14 DIAGNOSIS — Z931 Gastrostomy status: Secondary | ICD-10-CM

## 2018-03-14 HISTORY — DX: Acute and chronic respiratory failure with hypoxia: J96.21

## 2018-03-14 LAB — BLOOD GAS, ARTERIAL
ACID-BASE EXCESS: 6.3 mmol/L — AB (ref 0.0–2.0)
Bicarbonate: 31 mmol/L — ABNORMAL HIGH (ref 20.0–28.0)
FIO2: 0.4
O2 SAT: 90.8 %
PCO2 ART: 51.5 mmHg — AB (ref 32.0–48.0)
PEEP/CPAP: 5 cmH2O
PH ART: 7.397 (ref 7.350–7.450)
Patient temperature: 98.6
RATE: 16 resp/min
VT: 480 mL
pO2, Arterial: 58.8 mmHg — ABNORMAL LOW (ref 83.0–108.0)

## 2018-03-14 MED ORDER — IOPAMIDOL (ISOVUE-300) INJECTION 61%
INTRAVENOUS | Status: AC
  Filled 2018-03-14: qty 50

## 2018-03-15 ENCOUNTER — Encounter: Payer: Self-pay | Admitting: Internal Medicine

## 2018-03-15 DIAGNOSIS — J9621 Acute and chronic respiratory failure with hypoxia: Secondary | ICD-10-CM | POA: Diagnosis not present

## 2018-03-15 DIAGNOSIS — J849 Interstitial pulmonary disease, unspecified: Secondary | ICD-10-CM | POA: Diagnosis not present

## 2018-03-15 DIAGNOSIS — I214 Non-ST elevation (NSTEMI) myocardial infarction: Secondary | ICD-10-CM | POA: Diagnosis not present

## 2018-03-15 DIAGNOSIS — J441 Chronic obstructive pulmonary disease with (acute) exacerbation: Secondary | ICD-10-CM | POA: Diagnosis not present

## 2018-03-15 DIAGNOSIS — I482 Chronic atrial fibrillation: Secondary | ICD-10-CM

## 2018-03-15 DIAGNOSIS — N183 Chronic kidney disease, stage 3 (moderate): Secondary | ICD-10-CM | POA: Diagnosis not present

## 2018-03-15 DIAGNOSIS — J189 Pneumonia, unspecified organism: Secondary | ICD-10-CM | POA: Diagnosis not present

## 2018-03-15 HISTORY — DX: Acute and chronic respiratory failure with hypoxia: J96.21

## 2018-03-15 LAB — BLOOD GAS, ARTERIAL
Acid-Base Excess: 6.4 mmol/L — ABNORMAL HIGH (ref 0.0–2.0)
BICARBONATE: 30.1 mmol/L — AB (ref 20.0–28.0)
FIO2: 90
LHR: 25 {breaths}/min
O2 Saturation: 97.2 %
PATIENT TEMPERATURE: 98.6
PCO2 ART: 41.4 mmHg (ref 32.0–48.0)
PEEP/CPAP: 5 cmH2O
PH ART: 7.475 — AB (ref 7.350–7.450)
Pressure control: 35 cmH2O
pO2, Arterial: 81.8 mmHg — ABNORMAL LOW (ref 83.0–108.0)

## 2018-03-15 LAB — CBC WITH DIFFERENTIAL/PLATELET
BASOS PCT: 1 %
Basophils Absolute: 0.1 10*3/uL (ref 0.0–0.1)
EOS ABS: 0.1 10*3/uL (ref 0.0–0.7)
Eosinophils Relative: 1 %
HCT: 36.6 % — ABNORMAL LOW (ref 39.0–52.0)
HEMOGLOBIN: 11.8 g/dL — AB (ref 13.0–17.0)
LYMPHS ABS: 0.2 10*3/uL — AB (ref 0.7–4.0)
Lymphocytes Relative: 2 %
MCH: 33.1 pg (ref 26.0–34.0)
MCHC: 32.2 g/dL (ref 30.0–36.0)
MCV: 102.5 fL — ABNORMAL HIGH (ref 78.0–100.0)
MONOS PCT: 3 %
Monocytes Absolute: 0.3 10*3/uL (ref 0.1–1.0)
NEUTROS ABS: 7.4 10*3/uL (ref 1.7–7.7)
NEUTROS PCT: 85 %
Platelets: 167 10*3/uL (ref 150–400)
RBC: 3.57 MIL/uL — ABNORMAL LOW (ref 4.22–5.81)
RDW: 15.7 % — ABNORMAL HIGH (ref 11.5–15.5)
WBC: 8.7 10*3/uL (ref 4.0–10.5)

## 2018-03-15 LAB — BASIC METABOLIC PANEL
ANION GAP: 8 (ref 5–15)
BUN: 36 mg/dL — ABNORMAL HIGH (ref 6–20)
CHLORIDE: 103 mmol/L (ref 101–111)
CO2: 28 mmol/L (ref 22–32)
Calcium: 9.1 mg/dL (ref 8.9–10.3)
Creatinine, Ser: 0.88 mg/dL (ref 0.61–1.24)
GFR calc non Af Amer: >60 mL/min (ref >60)
Glucose, Bld: 234 mg/dL — ABNORMAL HIGH (ref 65–99)
Potassium: 4.3 mmol/L (ref 3.5–5.1)
Sodium: 139 mmol/L (ref 135–145)

## 2018-03-15 NOTE — Consult Note (Signed)
[facility]  Date of Service: 03/15/2018  PULMONARY CONSULT   Craig [name]  [id number]  DOB: 04-30-1935   DOA: 09-29-2018  Referring Physician: Carron CurieAli Hijazi, MD  HPI: Craig [name] is a 83 y.o. male seen for follow up of Acute on Chronic Respiratory Failure.  Patient has a history of coronary artery disease with a recent non-STEMI which was medically managed.  Patient also had chronic kidney disease stage III presented to the hospital on April 27 with increasing shortness of breath.  Patient was seen in the emergency room after about 3 days onset and at that time was found to have a strongly positive troponin.  Patient underwent cardiac catheterization had a RCA occlusion which was managed medically.  In the ED the patient was found to be afebrile had some atrial fibrillation T wave inversions.  Troponin was found to be elevated at that time also.  [facility] that time for further management.  Patient has other complications including development of a spontaneous pneumothorax requiring a chest tube.  Also had acute on chronic diastolic heart failure which was managed conservatively.  Eventually patient was discharged.  Came back into the hospital on 22 May with respiratory failure.  At that time patient was intubated and was diagnosed with healthcare associated pneumonia.  Patient was started on treatment with vancomycin and cefepime along with Levaquin.  Patient also was felt to be having a COPD exacerbation.  At this time patient is on the ventilator is orally intubated currently on assist control mode has been requiring 50% oxygen.  Review of Systems:  ROS performed and is unremarkable other than noted above.  Past Medical History:  Diagnosis Date  . Arthritis   . Coronary atherosclerosis of native coronary artery    Nonobstructive at catheterization 2006  . Essential hypertension, benign    . Mixed hyperlipidemia   . Permanent atrial fibrillation (HCC)    Declined anticoagulation with Dr. Andee LinemaneGent 06/2012    Past Surgical History:  Procedure Laterality Date  . IR PERC PLEURAL DRAIN W/INDWELL CATH W/IMG GUIDE  02/22/2018  . JOINT REPLACEMENT     hip left  . LEFT HEART CATH AND CORONARY ANGIOGRAPHY N/A 01/31/2018   Procedure: LEFT HEART CATH AND CORONARY ANGIOGRAPHY;  Surgeon: Corky CraftsVaranasi, Jayadeep S, MD;  [facility];  Service: Cardiovascular;  Laterality: N/A;  . LUMBAR LAMINECTOMY  07/21/2012   Procedure: MICRODISCECTOMY LUMBAR LAMINECTOMY;  Surgeon: Eldred MangesMark C Yates, MD;  Location: [facility] OR;  Service: Orthopedics;  Laterality: Left;  Left L5-S1 Foraminotomy, Microdiscectomy  . MICRODISCECTOMY LUMBAR  07/21/2012  . ULTRASOUND GUIDANCE FOR VASCULAR ACCESS  01/31/2018   Procedure: Ultrasound Guidance For Vascular Access;  Surgeon: Corky CraftsVaranasi, Jayadeep S, MD;  [facility];  Service: Cardiovascular;;    Social History:    reports that he quit smoking about 35 years ago. His smoking use included cigarettes. He has a 30.00 pack-year smoking history. He has never used smokeless tobacco. He reports that he drinks about 8.4 oz of alcohol per week. He reports that he does not use drugs.  Family History: Non-Contributory to the present illness  No Known Allergies  Medications: Reviewed on Rounds  Physical Exam:  Vitals: Temperature 96.4 pulse 98 respiratory rate 21 blood pressure 122/68 saturations 95%  Ventilator Settings mode of ventilation assist control FiO2 50% tidal volume 473cc PEEP 5  . General: Comfortable at this time .  Eyes: Grossly normal lids, irises & conjunctiva . ENT: grossly tongue is normal . Neck: no obvious mass . Cardiovascular: S1-S2 normal no gallop or rub . Respiratory: Scattered rhonchi noted bilaterally . Abdomen: Obese and soft . Skin: no rash seen on limited exam . Musculoskeletal: not rigid . Psychiatric:unable to  assess . Neurologic: no seizure no involuntary movements         Labs on Admission:  Basic Metabolic Panel: Recent Labs  Lab 03/15/18 0930  NA 139  K 4.3  CL 103  CO2 28  GLUCOSE 234*  BUN 36*  CREATININE 0.88  CALCIUM 9.1    Liver Function Tests: No results for input(s): AST, ALT, ALKPHOS, BILITOT, PROT, ALBUMIN in the last 168 hours. No results for input(s): LIPASE, AMYLASE in the last 168 hours. No results for input(s): AMMONIA in the last 168 hours.  CBC: Recent Labs  Lab 03/15/18 0930  WBC 8.7  NEUTROABS 7.4  HGB 11.8*  HCT 36.6*  MCV 102.5*  PLT 167    Cardiac Enzymes: No results for input(s): CKTOTAL, CKMB, CKMBINDEX, TROPONINI in the last 168 hours.  BNP (last 3 results) Recent Labs    02/10/18 0453 02/18/18 0441  BNP 647.3* 425.5*    ProBNP (last 3 results) No results for input(s): PROBNP in the last 8760 hours.   Radiological Exams on Admission: Dg Chest Port 1 View  Result Date: 02/15/2018 CLINICAL DATA:  83 y/o  M; OG tube placement and ET tube placement. EXAM: PORTABLE CHEST 1 VIEW COMPARISON:  02/26/2018 chest radiograph FINDINGS: Endotracheal tube tip projects 7.6 cm above the carina. Enteric tube tip projects over gastric body. Stable cardiac silhouette given projection and technique. Aortic calcific atherosclerosis. Right PICC line tip projects over the right atrium and is stable. Stable patchy consolidations of the lungs bilaterally. Multilevel degenerative changes of the spine. IMPRESSION: 1. Endotracheal tube tip projects 7.6 cm above the carina. 2. Enteric tube tip projects over gastric body. 3. Stable patchy consolidations of the lungs. Aortic atherosclerosis. Electronically Signed   By: Mitzi Hansen M.D.   On: 03/08/2018 22:47   Dg Abd Portable 1v  Result Date: 02/21/2018 CLINICAL DATA:  83 y/o  M; OG tube placement and ET tube placement. EXAM: PORTABLE ABDOMEN - 1 VIEW COMPARISON:  None. FINDINGS: Normal bowel gas pattern.  Enteric tube tip projects over gastric body. Partially visualized left hip replacement. Multilevel degenerative changes of the spine. IMPRESSION: Enteric tube tip projects over gastric body. Electronically Signed   By: Mitzi Hansen M.D.   On: 03/10/2018 22:47    Assessment/Plan Principal Problem:   Acute on chronic respiratory failure with hypoxia (HCC) Active Problems:   Permanent atrial fibrillation (HCC)   Non-STEMI (non-ST elevated myocardial infarction) (HCC)   CKD (chronic kidney disease), stage III (HCC)   ILD (interstitial lung disease) (HCC)   COPD with acute exacerbation (HCC)   Healthcare-associated pneumonia   1. Acute on chronic respiratory failure with hypoxia respiratory therapy will assess the mechanics and will see if we are able to wean him.  He has a long course as far as his ventilatory requirements are concerned and will likely need to have a tracheostomy.  We will go ahead and place the ENT consultation and now. 2. Healthcare associated pneumonia treated with antibiotics the last chest film shows patchy consolidations being present. 3. Status post non-STEMI right now is stable we will continue to monitor 4. COPD with exacerbation continue with present management I have adjusted the nebulizer  regimen somewhat 5. Chronic atrial fibrillation rate is controlled we will continue with present management. 6. Chronic interstitial lung disease at this time is stable we will monitor closely. 7. Chronic kidney disease stage III monitor labs closely  I have personally seen and evaluated the patient, evaluated laboratory and imaging results, formulated the assessment and plan and placed orders. The Patient requires high complexity decision making for assessment and support.  Case was discussed on Rounds with the Respiratory Therapy Staff Time Spent in review of the medical record consultation with primary care team and medical staff and coordination of  care.  Yevonne Pax, MD [facility] Pulmonary Critical Care Medicine Sleep Medicine

## 2018-03-15 DEATH — deceased

## 2018-03-16 ENCOUNTER — Other Ambulatory Visit (HOSPITAL_COMMUNITY): Payer: Medicare Other

## 2018-03-16 DIAGNOSIS — J9621 Acute and chronic respiratory failure with hypoxia: Secondary | ICD-10-CM | POA: Diagnosis not present

## 2018-03-16 DIAGNOSIS — J189 Pneumonia, unspecified organism: Secondary | ICD-10-CM | POA: Diagnosis not present

## 2018-03-16 DIAGNOSIS — J441 Chronic obstructive pulmonary disease with (acute) exacerbation: Secondary | ICD-10-CM | POA: Diagnosis not present

## 2018-03-16 DIAGNOSIS — N183 Chronic kidney disease, stage 3 (moderate): Secondary | ICD-10-CM | POA: Diagnosis not present

## 2018-03-16 LAB — BLOOD GAS, ARTERIAL
Acid-Base Excess: 7.8 mmol/L — ABNORMAL HIGH (ref 0.0–2.0)
Bicarbonate: 30.8 mmol/L — ABNORMAL HIGH (ref 20.0–28.0)
FIO2: 0.8
LHR: 22 {breaths}/min
O2 SAT: 99.5 %
PCO2 ART: 33.6 mmHg (ref 32.0–48.0)
PEEP: 5 cmH2O
PH ART: 7.566 — AB (ref 7.350–7.450)
Patient temperature: 96.9
Pressure control: 28 cmH2O
pO2, Arterial: 185 mmHg — ABNORMAL HIGH (ref 83.0–108.0)

## 2018-03-16 LAB — PHOSPHORUS

## 2018-03-16 LAB — CBC WITH DIFFERENTIAL/PLATELET
BAND NEUTROPHILS: 0 %
BASOS ABS: 0 10*3/uL (ref 0.0–0.1)
BASOS PCT: 0 %
Blasts: 0 %
EOS ABS: 0 10*3/uL (ref 0.0–0.7)
EOS PCT: 0 %
HEMATOCRIT: 32.8 % — AB (ref 39.0–52.0)
Hemoglobin: 10.8 g/dL — ABNORMAL LOW (ref 13.0–17.0)
LYMPHS PCT: 11 %
Lymphs Abs: 0.9 10*3/uL (ref 0.7–4.0)
MCH: 33 pg (ref 26.0–34.0)
MCHC: 32.9 g/dL (ref 30.0–36.0)
MCV: 100.3 fL — ABNORMAL HIGH (ref 78.0–100.0)
METAMYELOCYTES PCT: 0 %
MONO ABS: 0.4 10*3/uL (ref 0.1–1.0)
MONOS PCT: 5 %
Myelocytes: 0 %
NEUTROS ABS: 6.8 10*3/uL (ref 1.7–7.7)
Neutrophils Relative %: 84 %
OTHER: 0 %
Platelets: 184 10*3/uL (ref 150–400)
Promyelocytes Relative: 0 %
RBC: 3.27 MIL/uL — ABNORMAL LOW (ref 4.22–5.81)
RDW: 15.4 % (ref 11.5–15.5)
WBC: 8.1 10*3/uL (ref 4.0–10.5)
nRBC: 0 /100 WBC

## 2018-03-16 LAB — BASIC METABOLIC PANEL
ANION GAP: 6 (ref 5–15)
BUN: 34 mg/dL — AB (ref 6–20)
CO2: 30 mmol/L (ref 22–32)
Calcium: 8.8 mg/dL — ABNORMAL LOW (ref 8.9–10.3)
Chloride: 101 mmol/L (ref 101–111)
Creatinine, Ser: 0.88 mg/dL (ref 0.61–1.24)
GFR calc Af Amer: >60 mL/min (ref >60)
GLUCOSE: 216 mg/dL — AB (ref 65–99)
POTASSIUM: 3.7 mmol/L (ref 3.5–5.1)
Sodium: 137 mmol/L (ref 135–145)

## 2018-03-16 LAB — MAGNESIUM: Magnesium: 2.1 mg/dL (ref 1.7–2.4)

## 2018-03-16 NOTE — Progress Notes (Signed)
Pulmonary Critical Care Medicine [facility]   PULMONARY SERVICE  PROGRESS NOTE  Date of Service: 03/16/2018  Craig [name]  ZOX:[id number]  DOB: [birth year]   DOA: 03/10/2018  Referring Physician: Carron Curie, MD  HPI: Craig [name] is a 83 y.o. male seen for follow up of Acute on Chronic Respiratory Failure.  Remains on pressure control mode at this time is on 80% FiO2 with PEEP of 5.  Is comfortable right now.  Has not been able to really do much in the way of weaning  Medications: Reviewed on Rounds  Physical Exam:  Vitals: Temperature 98.1 pulse 112 respiratory rate 25 blood pressure 130/78 saturations 96%  Ventilator Settings mode of ventilation pressure control FiO2 80% tidal 618cc PEEP 5  . General: Comfortable at this time . Eyes: Grossly normal lids, irises & conjunctiva . ENT: grossly tongue is normal . Neck: no obvious mass . Cardiovascular: S1 S2 normal no gallop . Respiratory: Coarse rhonchi noted bilaterally . Abdomen: soft . Skin: no rash seen on limited exam . Musculoskeletal: not rigid . Psychiatric:unable to assess . Neurologic: no seizure no involuntary movements         Labs on Admission:  Basic Metabolic Panel: Recent Labs  Lab 03/15/18 0930 03/16/18 0839  NA 139 137  K 4.3 3.7  CL 103 101  CO2 28 30  GLUCOSE 234* 216*  BUN 36* 34*  CREATININE 0.88 0.88  CALCIUM 9.1 8.8*  MG  --  2.1  PHOS  --  <1.0*    Liver Function Tests: No results for input(s): AST, ALT, ALKPHOS, BILITOT, PROT, ALBUMIN in the last 168 hours. No results for input(s): LIPASE, AMYLASE in the last 168 hours. No results for input(s): AMMONIA in the last 168 hours.  CBC: Recent Labs  Lab 03/15/18 0930 03/16/18 0839  WBC 8.7 8.1  NEUTROABS 7.4 6.8  HGB 11.8* 10.8*  HCT 36.6* 32.8*  MCV 102.5* 100.3*  PLT 167 184    Cardiac Enzymes: No results for input(s): CKTOTAL, CKMB, CKMBINDEX, TROPONINI in the last 168 hours.  BNP (last 3  results) Recent Labs    02/10/18 0453 02/18/18 0441  BNP 647.3* 425.5*    ProBNP (last 3 results) No results for input(s): PROBNP in the last 8760 hours.  Radiological Exams on Admission: Dg Chest Port 1 View  Result Date: 03/16/2018 CLINICAL DATA:  Ventilator dependent EXAM: PORTABLE CHEST 1 VIEW COMPARISON:  Mar 14, 2018 FINDINGS: An ET tube, right PICC line, and NG tube are all in good position. No pneumothorax. Bibasilar pulmonary opacities are stable. No interval change. IMPRESSION: Stable support apparatus as above. Stable bibasilar pulmonary opacities. Electronically Signed   By: Gerome Sam III M.D   On: 03/16/2018 08:15   Dg Chest Port 1 View  Result Date: 02/20/2018 CLINICAL DATA:  83 y/o  M; OG tube placement and ET tube placement. EXAM: PORTABLE CHEST 1 VIEW COMPARISON:  02/26/2018 chest radiograph FINDINGS: Endotracheal tube tip projects 7.6 cm above the carina. Enteric tube tip projects over gastric body. Stable cardiac silhouette given projection and technique. Aortic calcific atherosclerosis. Right PICC line tip projects over the right atrium and is stable. Stable patchy consolidations of the lungs bilaterally. Multilevel degenerative changes of the spine. IMPRESSION: 1. Endotracheal tube tip projects 7.6 cm above the carina. 2. Enteric tube tip projects over gastric body. 3. Stable patchy consolidations of the lungs. Aortic atherosclerosis. Electronically Signed   By: Mitzi Hansen M.D.   On: 02/27/2018  22:47   Dg Abd Portable 1v  Result Date: 20-Mar-2018 CLINICAL DATA:  83 y/o  M; OG tube placement and ET tube placement. EXAM: PORTABLE ABDOMEN - 1 VIEW COMPARISON:  None. FINDINGS: Normal bowel gas pattern. Enteric tube tip projects over gastric body. Partially visualized left hip replacement. Multilevel degenerative changes of the spine. IMPRESSION: Enteric tube tip projects over gastric body. Electronically Signed   By: Mitzi HansenLance  Furusawa-Stratton M.D.   On:  006-Jun-2019 22:47    Assessment/Plan Principal Problem:   Acute on chronic respiratory failure with hypoxia (HCC) Active Problems:   Permanent atrial fibrillation (HCC)   Non-STEMI (non-ST elevated myocardial infarction) (HCC)   CKD (chronic kidney disease), stage III (HCC)   ILD (interstitial lung disease) (HCC)   COPD with acute exacerbation (HCC)   Healthcare-associated pneumonia   1. Acute on chronic respiratory failure with hypoxia continue with trying to decrease the FiO2 down will titrate as tolerated continue with aggressive pulmonary toilet.  Last chest x-ray had shown some stable patchy consolidations. 2. Non-STEMI stable at this time no active disease 3. Chronic kidney disease stage III monitor fluids consider nephrology evaluation. 4. Chronic interstitial lung disease continue with supportive care prognosis guarded 5. Healthcare associated pneumonia treated with antibiotics we will follow along 6. Permanent atrial fibrillation rate right now is controlled we will continue to follow. 7. COPD stable   I have personally seen and evaluated the patient, evaluated laboratory and imaging results, formulated the assessment and plan and placed orders. The Patient requires high complexity decision making for assessment and support.  Case was discussed on Rounds with the Respiratory Therapy Staff  Yevonne PaxSaadat A Bianney Rockwood, MD [facility] Pulmonary Critical Care Medicine Sleep Medicine

## 2018-03-17 ENCOUNTER — Other Ambulatory Visit (HOSPITAL_COMMUNITY): Payer: Medicare Other

## 2018-03-17 DIAGNOSIS — N183 Chronic kidney disease, stage 3 (moderate): Secondary | ICD-10-CM | POA: Diagnosis not present

## 2018-03-17 DIAGNOSIS — J189 Pneumonia, unspecified organism: Secondary | ICD-10-CM | POA: Diagnosis not present

## 2018-03-17 DIAGNOSIS — J9621 Acute and chronic respiratory failure with hypoxia: Secondary | ICD-10-CM | POA: Diagnosis not present

## 2018-03-17 DIAGNOSIS — J441 Chronic obstructive pulmonary disease with (acute) exacerbation: Secondary | ICD-10-CM | POA: Diagnosis not present

## 2018-03-17 LAB — BLOOD GAS, ARTERIAL
ACID-BASE EXCESS: 8.5 mmol/L — AB (ref 0.0–2.0)
Acid-Base Excess: 9.3 mmol/L — ABNORMAL HIGH (ref 0.0–2.0)
Bicarbonate: 32.7 mmol/L — ABNORMAL HIGH (ref 20.0–28.0)
Bicarbonate: 34.4 mmol/L — ABNORMAL HIGH (ref 20.0–28.0)
FIO2: 80
FIO2: 90
LHR: 22 {breaths}/min
MECHVT: 500 mL
O2 SAT: 86.5 %
O2 Saturation: 86 %
PATIENT TEMPERATURE: 98.6
PATIENT TEMPERATURE: 98.6
PCO2 ART: 47.4 mmHg (ref 32.0–48.0)
PCO2 ART: 57.2 mmHg — AB (ref 32.0–48.0)
PEEP: 8 cmH2O
PEEP: 10 cmH2O
PH ART: 7.453 — AB (ref 7.350–7.450)
PO2 ART: 51.3 mmHg — AB (ref 83.0–108.0)
Pressure control: 24 cmH2O
RATE: 22 resp/min
pH, Arterial: 7.396 (ref 7.350–7.450)
pO2, Arterial: 49.3 mmHg — ABNORMAL LOW (ref 83.0–108.0)

## 2018-03-17 LAB — CBC WITH DIFFERENTIAL/PLATELET
BAND NEUTROPHILS: 13 %
BLASTS: 0 %
Basophils Absolute: 0 10*3/uL (ref 0.0–0.1)
Basophils Relative: 0 %
EOS ABS: 0 10*3/uL (ref 0.0–0.7)
Eosinophils Relative: 0 %
HCT: 31.5 % — ABNORMAL LOW (ref 39.0–52.0)
HEMOGLOBIN: 10.3 g/dL — AB (ref 13.0–17.0)
LYMPHS PCT: 1 %
Lymphs Abs: 0.1 10*3/uL — ABNORMAL LOW (ref 0.7–4.0)
MCH: 33.1 pg (ref 26.0–34.0)
MCHC: 32.7 g/dL (ref 30.0–36.0)
MCV: 101.3 fL — AB (ref 78.0–100.0)
MONOS PCT: 1 %
Metamyelocytes Relative: 6 %
Monocytes Absolute: 0.1 10*3/uL (ref 0.1–1.0)
Myelocytes: 1 %
NEUTROS ABS: 7.2 10*3/uL (ref 1.7–7.7)
NEUTROS PCT: 78 %
OTHER: 0 %
PROMYELOCYTES RELATIVE: 0 %
Platelets: 177 10*3/uL (ref 150–400)
RBC: 3.11 MIL/uL — ABNORMAL LOW (ref 4.22–5.81)
RDW: 15.6 % — AB (ref 11.5–15.5)
WBC: 7.4 10*3/uL (ref 4.0–10.5)
nRBC: 0 /100 WBC

## 2018-03-17 LAB — PHOSPHORUS
PHOSPHORUS: 1.6 mg/dL — AB (ref 2.5–4.6)
Phosphorus: 2.8 mg/dL (ref 2.5–4.6)

## 2018-03-17 LAB — BASIC METABOLIC PANEL
ANION GAP: 7 (ref 5–15)
ANION GAP: 9 (ref 5–15)
BUN: 35 mg/dL — ABNORMAL HIGH (ref 6–20)
BUN: 39 mg/dL — AB (ref 6–20)
CALCIUM: 8.4 mg/dL — AB (ref 8.9–10.3)
CHLORIDE: 99 mmol/L — AB (ref 101–111)
CO2: 31 mmol/L (ref 22–32)
CO2: 33 mmol/L — ABNORMAL HIGH (ref 22–32)
Calcium: 8.7 mg/dL — ABNORMAL LOW (ref 8.9–10.3)
Chloride: 101 mmol/L (ref 101–111)
Creatinine, Ser: 0.9 mg/dL (ref 0.61–1.24)
Creatinine, Ser: 1.1 mg/dL (ref 0.61–1.24)
GFR calc Af Amer: >60 mL/min (ref >60)
GFR calc non Af Amer: >60 mL/min (ref >60)
GLUCOSE: 159 mg/dL — AB (ref 65–99)
Glucose, Bld: 290 mg/dL — ABNORMAL HIGH (ref 65–99)
POTASSIUM: 3.6 mmol/L (ref 3.5–5.1)
Potassium: 3.9 mmol/L (ref 3.5–5.1)
SODIUM: 139 mmol/L (ref 135–145)
Sodium: 141 mmol/L (ref 135–145)

## 2018-03-17 LAB — CBC
HCT: 37 % — ABNORMAL LOW (ref 39.0–52.0)
HEMOGLOBIN: 11.8 g/dL — AB (ref 13.0–17.0)
MCH: 32.4 pg (ref 26.0–34.0)
MCHC: 31.9 g/dL (ref 30.0–36.0)
MCV: 101.6 fL — AB (ref 78.0–100.0)
Platelets: 177 10*3/uL (ref 150–400)
RBC: 3.64 MIL/uL — AB (ref 4.22–5.81)
RDW: 15.6 % — ABNORMAL HIGH (ref 11.5–15.5)
WBC: 8 10*3/uL (ref 4.0–10.5)

## 2018-03-17 LAB — APTT: aPTT: 28 seconds (ref 24–36)

## 2018-03-17 LAB — PROTIME-INR
INR: 1.89
Prothrombin Time: 21.5 seconds — ABNORMAL HIGH (ref 11.4–15.2)

## 2018-03-17 LAB — MAGNESIUM
MAGNESIUM: 2 mg/dL (ref 1.7–2.4)
Magnesium: 2.1 mg/dL (ref 1.7–2.4)

## 2018-03-17 NOTE — Progress Notes (Signed)
[facility]ulmonary Critical [facility]   PULMONARY SERV[facility]  PROGRESS NOTE  Date of Service: 03/17/2018  Craig Countserry D Zelaya  EXB:[id number]  DOB: [birth year]   DOA: 03/07/2018  Referring Physician: Carron CurieAli Hijazi, MD  HPI: Craig [name] is a 83 y.o. male seen for follow up of Acute on Chronic Respiratory Failure.  Patient has had a significant decline in his status with increased oxygen requirement.  Right now is on 80% FiO2 and the PEEP was increased to 8  Medications: Reviewed on Rounds  Physical Exam:  Vitals: Temperature was 98.5 pulse 97 respiratory rate 20 blood pressure 120/64 saturations 99%  Ventilator Settings currently on pressure assist control mode FiO2 80% tidal volume 500 PEEP 8  . General: Comfortable at this time . Eyes: Grossly normal lids, irises & conjunctiva . ENT: grossly tongue is normal . Neck: no obvious mass . Cardiovascular: S1 S2 normal no gallop . Respiratory: No rhonchi expansion is equal . Abdomen: soft . Skin: no rash seen on limited exam . Musculoskeletal: not rigid . Psychiatric:unable to assess . Neurologic: no seizure no involuntary movements         Lab Data:   Basic Metabolic Panel: Recent Labs  Lab 03/15/18 0930 03/16/18 0839 03/17/18 0515  NA 139 137 139  K 4.3 3.7 3.6  CL 103 101 101  CO2 28 30 31   GLUCOSE 234* 216* 290*  BUN 36* 34* 35*  CREATININE 0.88 0.88 0.90  CALCIUM 9.1 8.8* 8.4*  MG  --  2.1 2.1  PHOS  --  <1.0* 1.6*    Liver Function Tests: No results for input(s): AST, ALT, ALKPHOS, BILITOT, PROT, ALBUMIN in the last 168 hours. No results for input(s): LIPASE, AMYLASE in the last 168 hours. No results for input(s): AMMONIA in the last 168 hours.  CBC: Recent Labs  Lab 03/15/18 0930 03/16/18 0839 03/17/18 0515  WBC 8.7 8.1 7.4  NEUTROABS 7.4 6.8 7.2  HGB 11.8* 10.8* 10.3*  HCT 36.6* 32.8* 31.5*  MCV 102.5* 100.3* 101.3*  PLT 167 184 177    Cardiac Enzymes: No results for  input(s): CKTOTAL, CKMB, CKMBINDEX, TROPONINI in the last 168 hours.  BNP (last 3 results) Recent Labs    02/10/18 0453 02/18/18 0441  BNP 647.3* 425.5*    ProBNP (last 3 results) No results for input(s): PROBNP in the last 8760 hours.  Radiological Exams: Dg Chest Port 1 View  Result Date: 03/17/2018 CLINICAL DATA:  Hypoxia. EXAM: PORTABLE CHEST 1 VIEW COMPARISON:  Chest x-ray from yesterday. FINDINGS: Unchanged positioning of the endotracheal tube, enteric tube, and right upper extremity PICC line. Stable cardiomediastinal silhouette. Bibasilar pulmonary opacities may have slightly increased on the right. No pneumothorax or pleural effusion. No acute osseous abnormality. IMPRESSION: 1. Stable support apparatus. 2. Bibasilar pulmonary opacities, slightly worsened on the right. Electronically Signed   By: Obie DredgeWilliam T Derry M.D.   On: 03/17/2018 17:32   Dg Chest Port 1 View  Result Date: 03/16/2018 CLINICAL DATA:  Ventilator dependent EXAM: PORTABLE CHEST 1 VIEW COMPARISON:  Mar 14, 2018 FINDINGS: An ET tube, right PICC line, and NG tube are all in good position. No pneumothorax. Bibasilar pulmonary opacities are stable. No interval change. IMPRESSION: Stable support apparatus as above. Stable bibasilar pulmonary opacities. Electronically Signed   By: Gerome Samavid  Williams III M.D   On: 03/16/2018 08:15    Assessment/Plan Principal Problem:   Acute on chronic respiratory failure with hypoxia [facility](HCC) Active Problems:   Permanent atrial fibrillation (  HCC)   Non-STEMI (non-ST elevated myocardial infarction) (HCC)   CKD (chronic kidney disease), stage III (HCC)   ILD (interstitial lung disease) (HCC)   COPD with acute exacerbation (HCC)   Healthcare-associated pneumonia   1. Acute on chronic respiratory failure with hypoxia continue with full vent support increased PEEP up to 12.  Also suggested maintaining saturations greater than 88%.  Discussed with his primary care team we will get a CT scan of  the chest done to assess for pulmonary embolism since he has such a profound hypoxia noted on his blood gas.  Also will give [place] a better look at the parenchyma to see if there is any underlying pneumonitis. 2. Interstitial lung disease severe disease we will as mentioned above get a CT scan of the chest 3. Chronic kidney disease stage III follow labs prognosis guarded 4. Healthcare associated pneumonia patient's overall prognosis is guarded 5. COPD severe disease we will continue present management. 6. Non-STEMI stable at this time 7. Permanent atrial fibrillation rate is controlled   I have personally seen and evaluated the patient, evaluated laboratory and imaging results, formulated the assessment and plan and placed orders. The Patient requires high complexity decision making for assessment and support.  Case was discussed on Rounds with the Respiratory Therapy Staff time spent 35 minutes review of the ancillary data radiological studies discussion with primary care coordination of care  Yevonne Pax, MD [facility] Pulmonary Critical Care Medicine Sleep Medicine

## 2018-03-18 LAB — COMPREHENSIVE METABOLIC PANEL
ALBUMIN: 1.5 g/dL — AB (ref 3.5–5.0)
ALT: 29 U/L (ref 17–63)
AST: 50 U/L — ABNORMAL HIGH (ref 15–41)
Alkaline Phosphatase: 70 U/L (ref 38–126)
Anion gap: 12 (ref 5–15)
BUN: 42 mg/dL — ABNORMAL HIGH (ref 6–20)
CHLORIDE: 99 mmol/L — AB (ref 101–111)
CO2: 32 mmol/L (ref 22–32)
Calcium: 8.4 mg/dL — ABNORMAL LOW (ref 8.9–10.3)
Creatinine, Ser: 1.44 mg/dL — ABNORMAL HIGH (ref 0.61–1.24)
GFR calc Af Amer: 51 mL/min — ABNORMAL LOW (ref >60)
GFR calc non Af Amer: 44 mL/min — ABNORMAL LOW (ref >60)
GLUCOSE: 304 mg/dL — AB (ref 65–99)
POTASSIUM: 5 mmol/L (ref 3.5–5.1)
SODIUM: 143 mmol/L (ref 135–145)
Total Bilirubin: 1.2 mg/dL (ref 0.3–1.2)
Total Protein: 4.5 g/dL — ABNORMAL LOW (ref 6.5–8.1)

## 2018-03-18 LAB — BLOOD GAS, ARTERIAL
Acid-Base Excess: 1.4 mmol/L (ref 0.0–2.0)
Bicarbonate: 28.5 mmol/L — ABNORMAL HIGH (ref 20.0–28.0)
FIO2: 100
MECHVT: 500 mL
O2 Saturation: 72.8 %
PEEP: 10 cmH2O
Patient temperature: 98.6
RATE: 22 resp/min
pCO2 arterial: 73.6 mmHg (ref 32.0–48.0)
pH, Arterial: 7.213 — ABNORMAL LOW (ref 7.350–7.450)
pO2, Arterial: 45.2 mmHg — ABNORMAL LOW (ref 83.0–108.0)

## 2018-03-18 LAB — PROTIME-INR
INR: 2.13
PROTHROMBIN TIME: 23.6 s — AB (ref 11.4–15.2)

## 2018-03-18 LAB — LACTIC ACID, PLASMA: LACTIC ACID, VENOUS: 7.2 mmol/L — AB (ref 0.5–1.9)

## 2018-03-18 LAB — PROCALCITONIN: Procalcitonin: 9.42 ng/mL

## 2018-03-18 LAB — APTT: aPTT: 31 seconds (ref 24–36)

## 2018-03-18 LAB — TROPONIN I: TROPONIN I: 0.24 ng/mL — AB (ref <0.03)

## 2018-03-19 MED FILL — Medication: Qty: 1 | Status: AC

## 2018-04-07 ENCOUNTER — Ambulatory Visit: Payer: Medicare Other | Admitting: Cardiology

## 2018-04-14 NOTE — Code Documentation (Signed)
  Patient Name: Craig [name]   MRN: [id number]   Date of Birth/ Sex: 1934/11/01 , male      Admission Date: 12/03/2017  Attending Provider: Carron CurieHijazi, Ali, MD  Primary Diagnosis: Acute on chronic respiratory failure with hypoxia [facility](HCC)   Indication: Pt was in his usual state of health until this PM, when he was noted to be bradycardic and hypotensive. Code blue was subsequently called. At the time of arrival on scene, ACLS protocol was underway and patient was found to have a pulse, rapid rate, hypotensive.   Technical Description:  - CPR performance duration:  0 minutes  - Was defibrillation or cardioversion used? No   - Was external pacer placed? No  - Was patient intubated pre/post CPR? Yes   Medications Administered: Y = Yes; Blank = No Amiodarone    Atropine  y  Calcium    Epinephrine  y  Lidocaine    Magnesium    Norepinephrine  y  Phenylephrine  y  Sodium bicarbonate  y  Vasopressin  y   Post CPR evaluation:  - Final Status - Was patient successfully resuscitated ? Yes - What is current rhythm? Sinus tachycardia - What is current hemodynamic status? Unstable  Miscellaneous Information:  - Labs sent, including: CMET, CBC, troponin, lactate, ABG  - Primary team notified?  Yes  - Family Notified? Yes  - Additional notes/ transfer status:  Patient remained severely hypotensive despite being on max dose of 3 pressors.  He also did not respond to pushes of sodium bicarb.  After discussion with family, decision was made that patient would not want to continue with these aggresssive measures and decision with several family members, including HCPOA, was made to proceed to comfort care.  The nurses with [facility] then contacted the primary attending for comfort orders.      Gwynn BurlyWallace, Yareliz Thorstenson, DO  04/09/2018, 12:58 AM

## 2018-04-14 DEATH — deceased

## 2019-01-24 IMAGING — XA IR PERC PLEURAL DRAIN W/INDWELL CATH W/IMG GUIDE
2 series · 2 of 2 positions shown · non-contrast
Comparison: none

INDICATION: 82-year-old male with a spontaneous right pneumothorax.

[Series 1: single · 1 of 1 slices shown (1 of 2)]
[im 1/1]
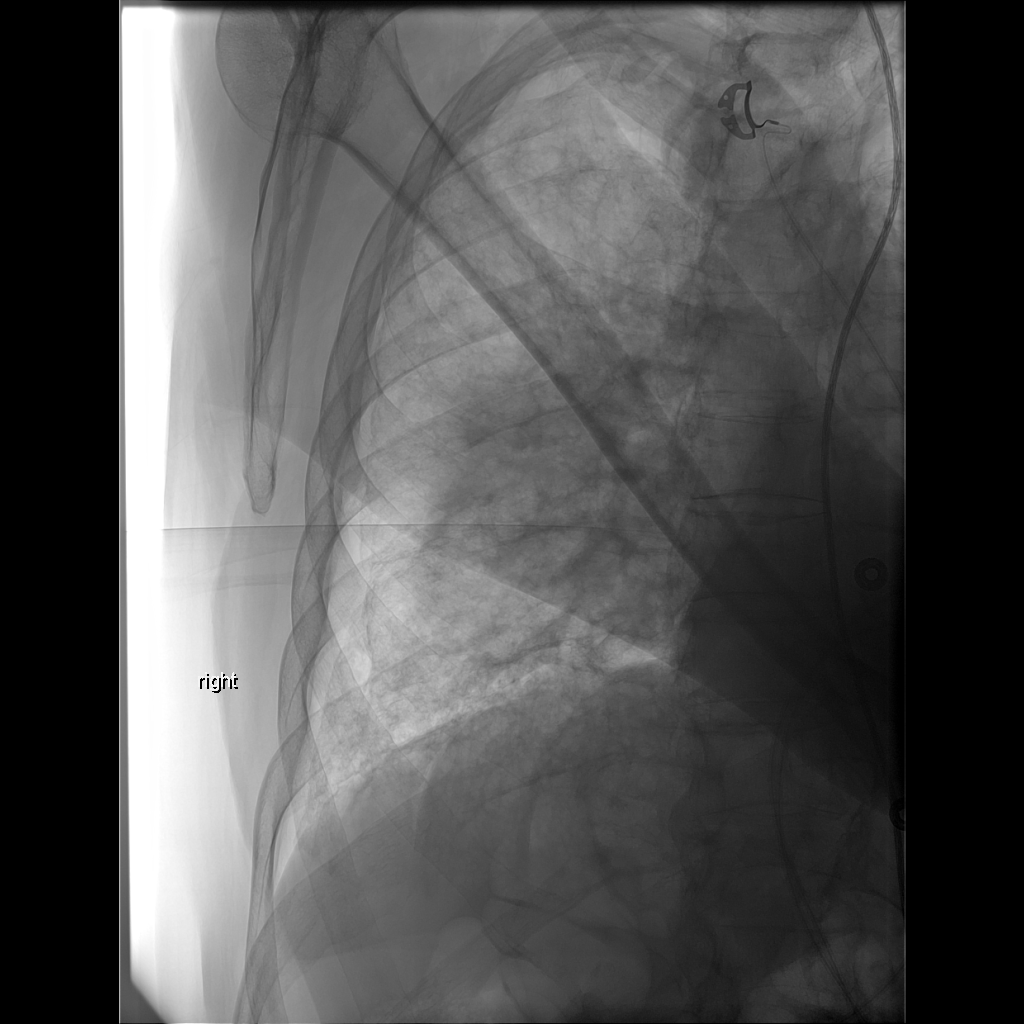

[Series 3: single · 1 of 1 slices shown (2 of 2)]
[im 1/1]
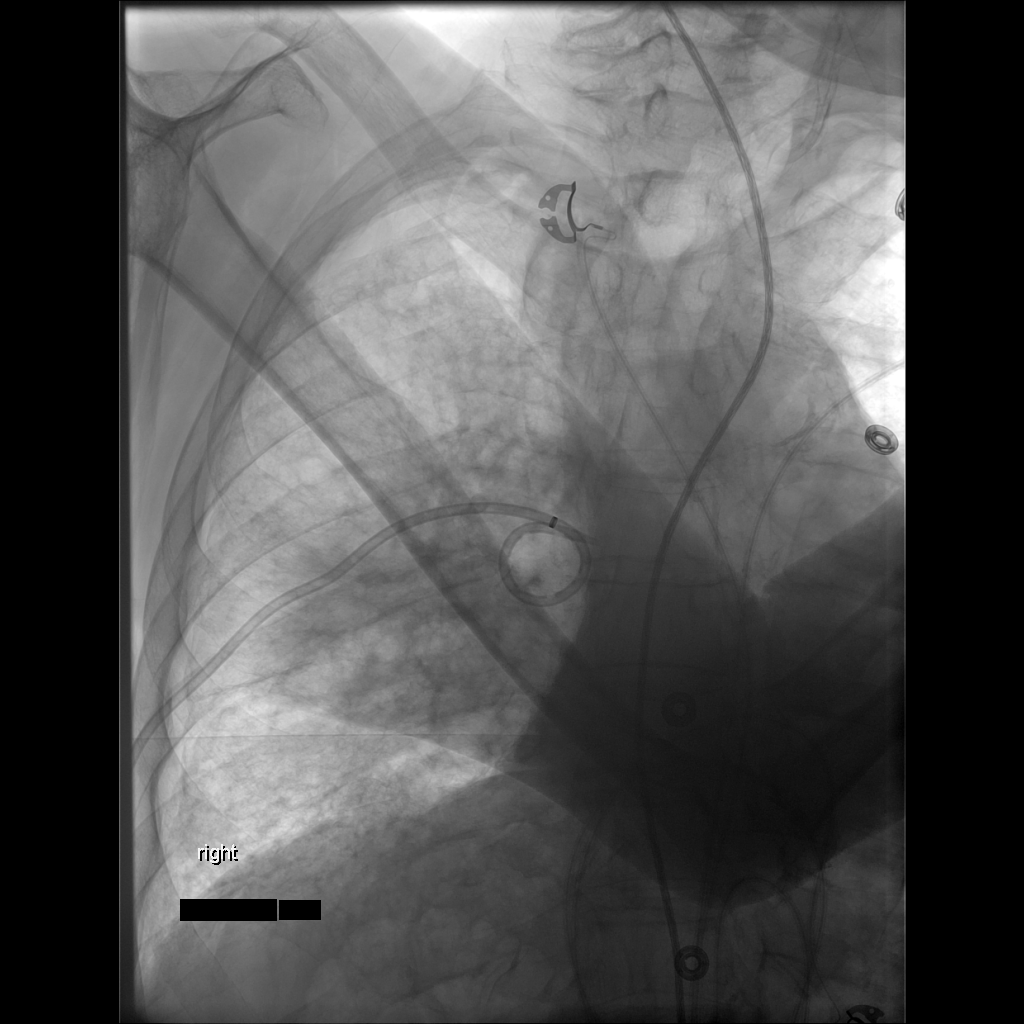

[2 of 2 positions shown; findings below may reference images not displayed]

EXAM:
IMAGE GUIDED RIGHT THORACOSTOMY TUBE PLACEMENT

MEDICATIONS:
The patient is currently admitted to the hospital and receiving
intravenous antibiotics. The antibiotics were administered within an
appropriate time frame prior to the initiation of the procedure.

ANESTHESIA/SEDATION:
Fentanyl 50 mcg IV; Versed 0 mg IV

NONE.

COMPLICATIONS:
NONE

PROCEDURE:
The procedure, risks, benefits, and alternatives were explained to
the patient/patient's family, who provided informed consent on the
patient's behalf. Specific risks that were addressed included
bleeding, infection, ongoing pneumothorax, need for further
procedure/surgery, chance of hemorrhage, hemoptysis, cardiopulmonary
collapse, death. Questions regarding the procedure were encouraged
and answered. The patient understands and consents to the procedure.

Patient was positioned in the right anterior oblique position on the
IR table and scout image of the chest was performed for planning
purposes.

The right mid axillary line at the level of the nipple was
identified, and prepped and draped in the usual sterile fashion. The
skin and subcutaneous tissues were generously infiltrated 1%
lidocaine for local anesthesia.

A Yueh needle was then used to enter the pleural space with
aspiration of air. The plastic Yueh catheter was advanced into the
pleural space and an 035 guidewire was advanced to the apex of the
lung under fluoroscopy. Dilation of the skin tract was performed
over the wire, and then modified Seldinger technique was used to
place a 10 French pigtail catheter into the anterior pleural space..

Catheter was attached to water seal chamber and suction was applied
confirming a operational chest tube.

Retention suture was placed.  Sterile dressing was placed.

Patient tolerated the procedure well and remained hemodynamically
stable throughout.

No complications were encountered and no significant blood loss was
encounter
IMPRESSION: Status post right thoracostomy tube placed.

## 2019-01-25 IMAGING — DX DG CHEST 1V PORT
1 series · 1 of 1 positions shown · non-contrast
Comparison: 02/22/2018, 02/20/2018

CLINICAL DATA: 82-year-old male with a history of right-sided
pneumothorax status post thoracostomy

EXAM:
PORTABLE CHEST 1 VIEW

[chest ap]
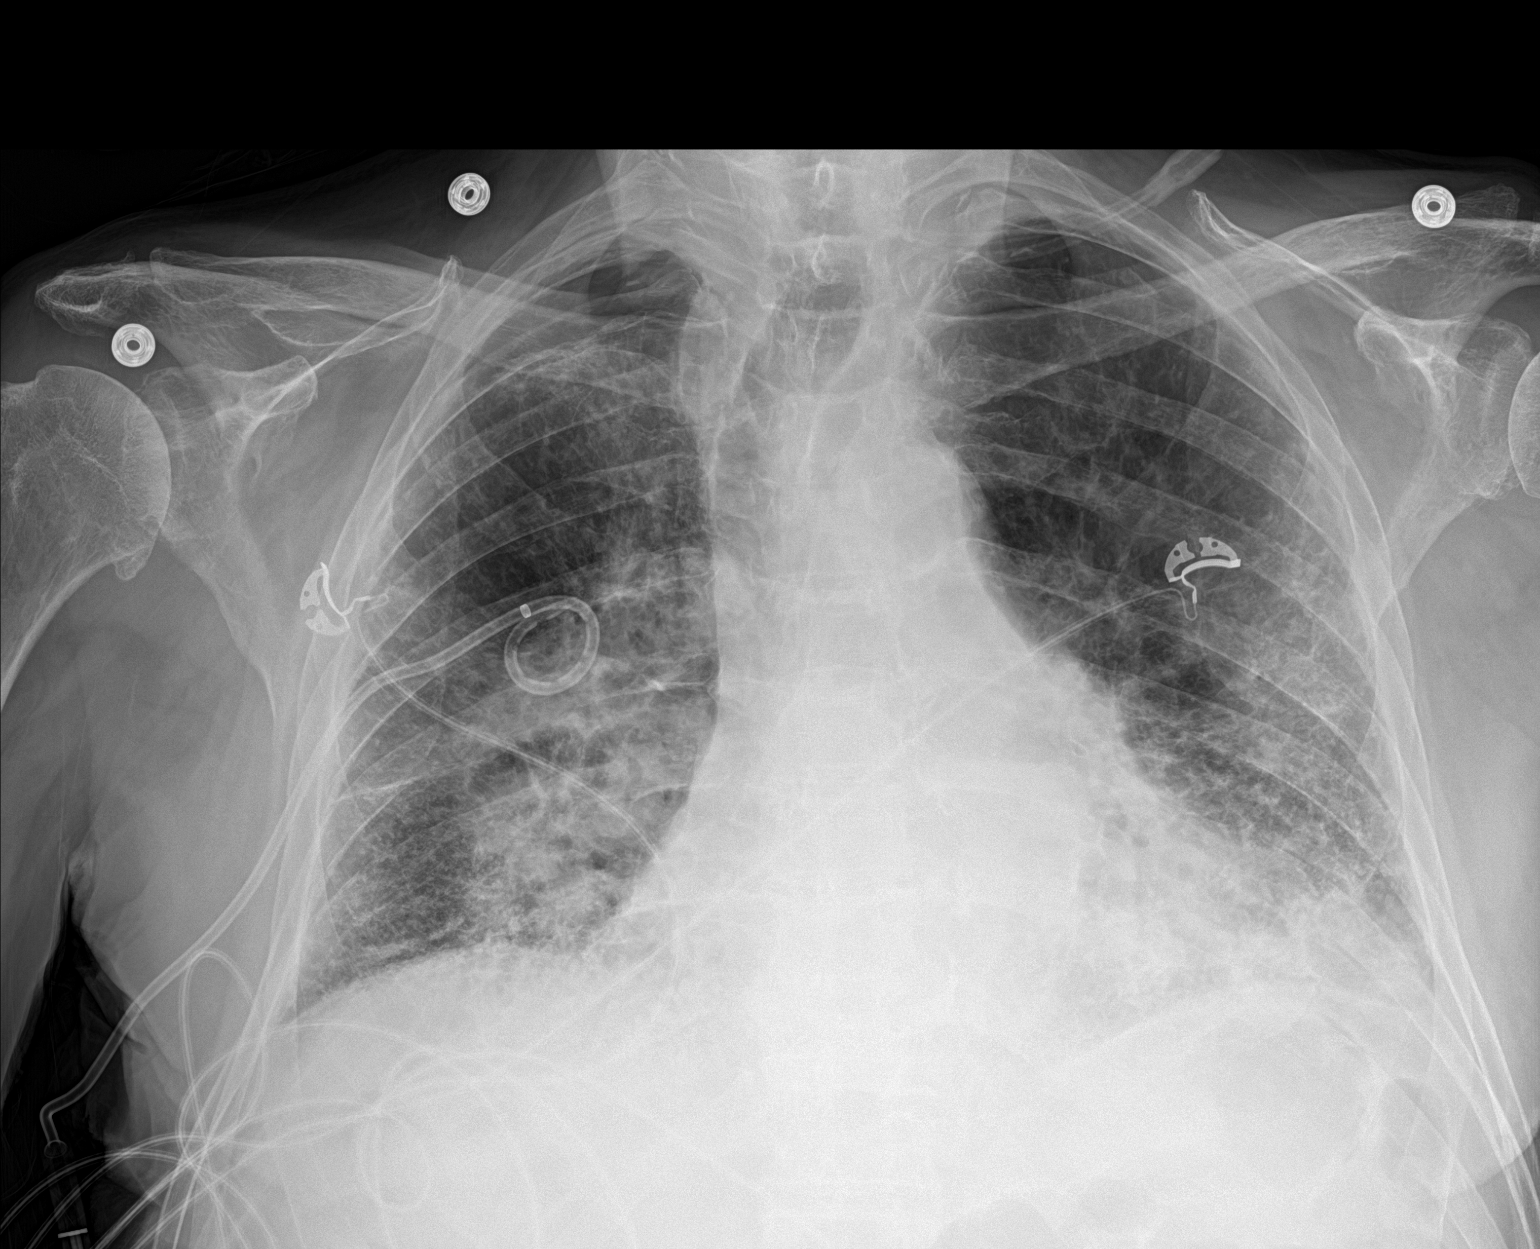

[1 of 1 positions shown; findings below may reference images not displayed]

FINDINGS: Cardiomediastinal silhouette unchanged in size and contour. Similar
appearance of mixed interstitial and airspace opacities in the
bilateral mid and lower lungs. No new airspace opacity. Reticular
opacities in the periphery with interlobular septal thickening.

Unchanged position of right-sided pigtail thoracostomy tube. Likely
small residual right apical pneumothorax.
IMPRESSION: Likely small residual apical pneumothorax on the right with
unchanged position of the thoracostomy tube.

Similar appearance of mixed interstitial and airspace disease of the
bilateral lungs.

## 2019-01-26 IMAGING — DX DG CHEST 1V PORT
1 series · 1 of 1 positions shown · non-contrast
Comparison: 02/23/2018

CLINICAL DATA: Shortness of breath

EXAM:
PORTABLE CHEST 1 VIEW

[chest ap]
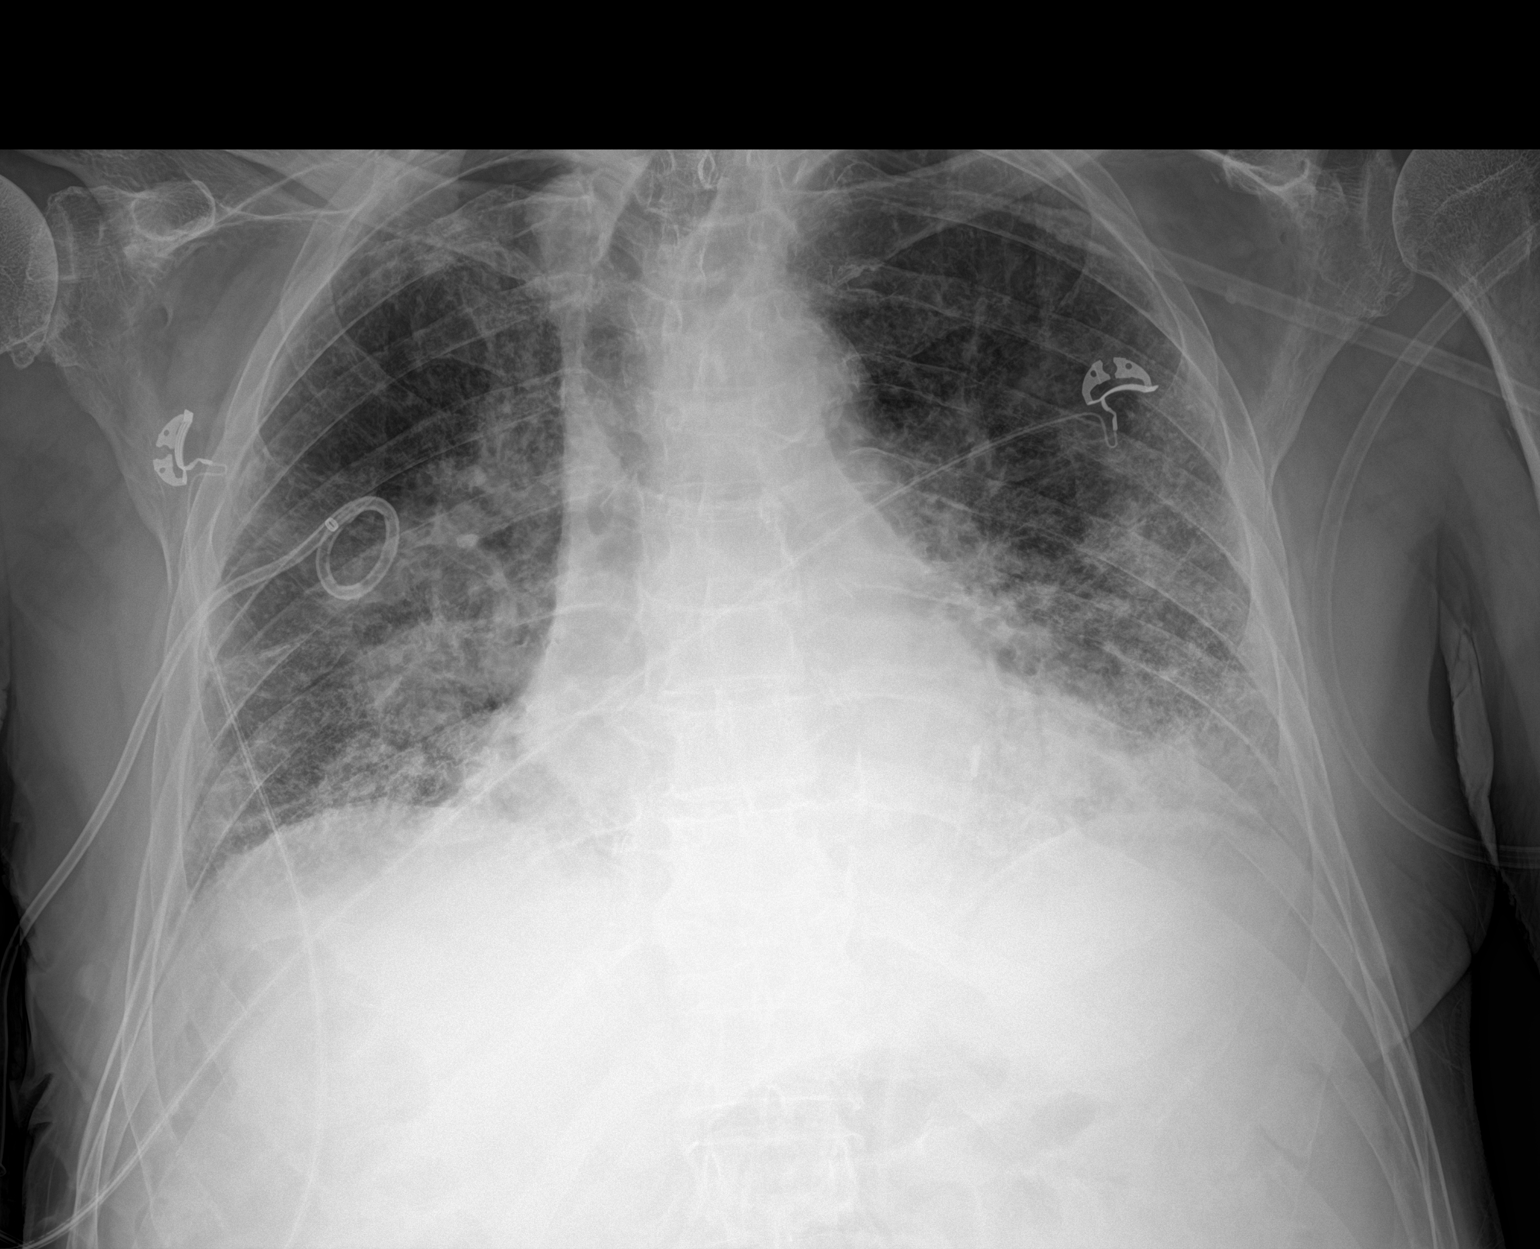

[1 of 1 positions shown; findings below may reference images not displayed]

FINDINGS: Cardiac shadow is within normal limits. Right-sided chest tube is
again identified. The right pneumothorax has decreased in size when
compare with the prior exam. Diffuse opacities are again identified
and stable bilaterally. No new focal abnormality is seen.
IMPRESSION: Stable bilateral infiltrates.

Reduction in right pneumothorax.

## 2019-01-27 IMAGING — CR DG CHEST 1V PORT
1 series · 1 of 1 positions shown · non-contrast
Comparison: Portable chest x-ray February 24, 2018

CLINICAL DATA: Shortness of breath, CHF, coronary artery disease,
right-sided percutaneous pleural catheter for right pneumothorax.

EXAM:
PORTABLE CHEST 1 VIEW

[AP]
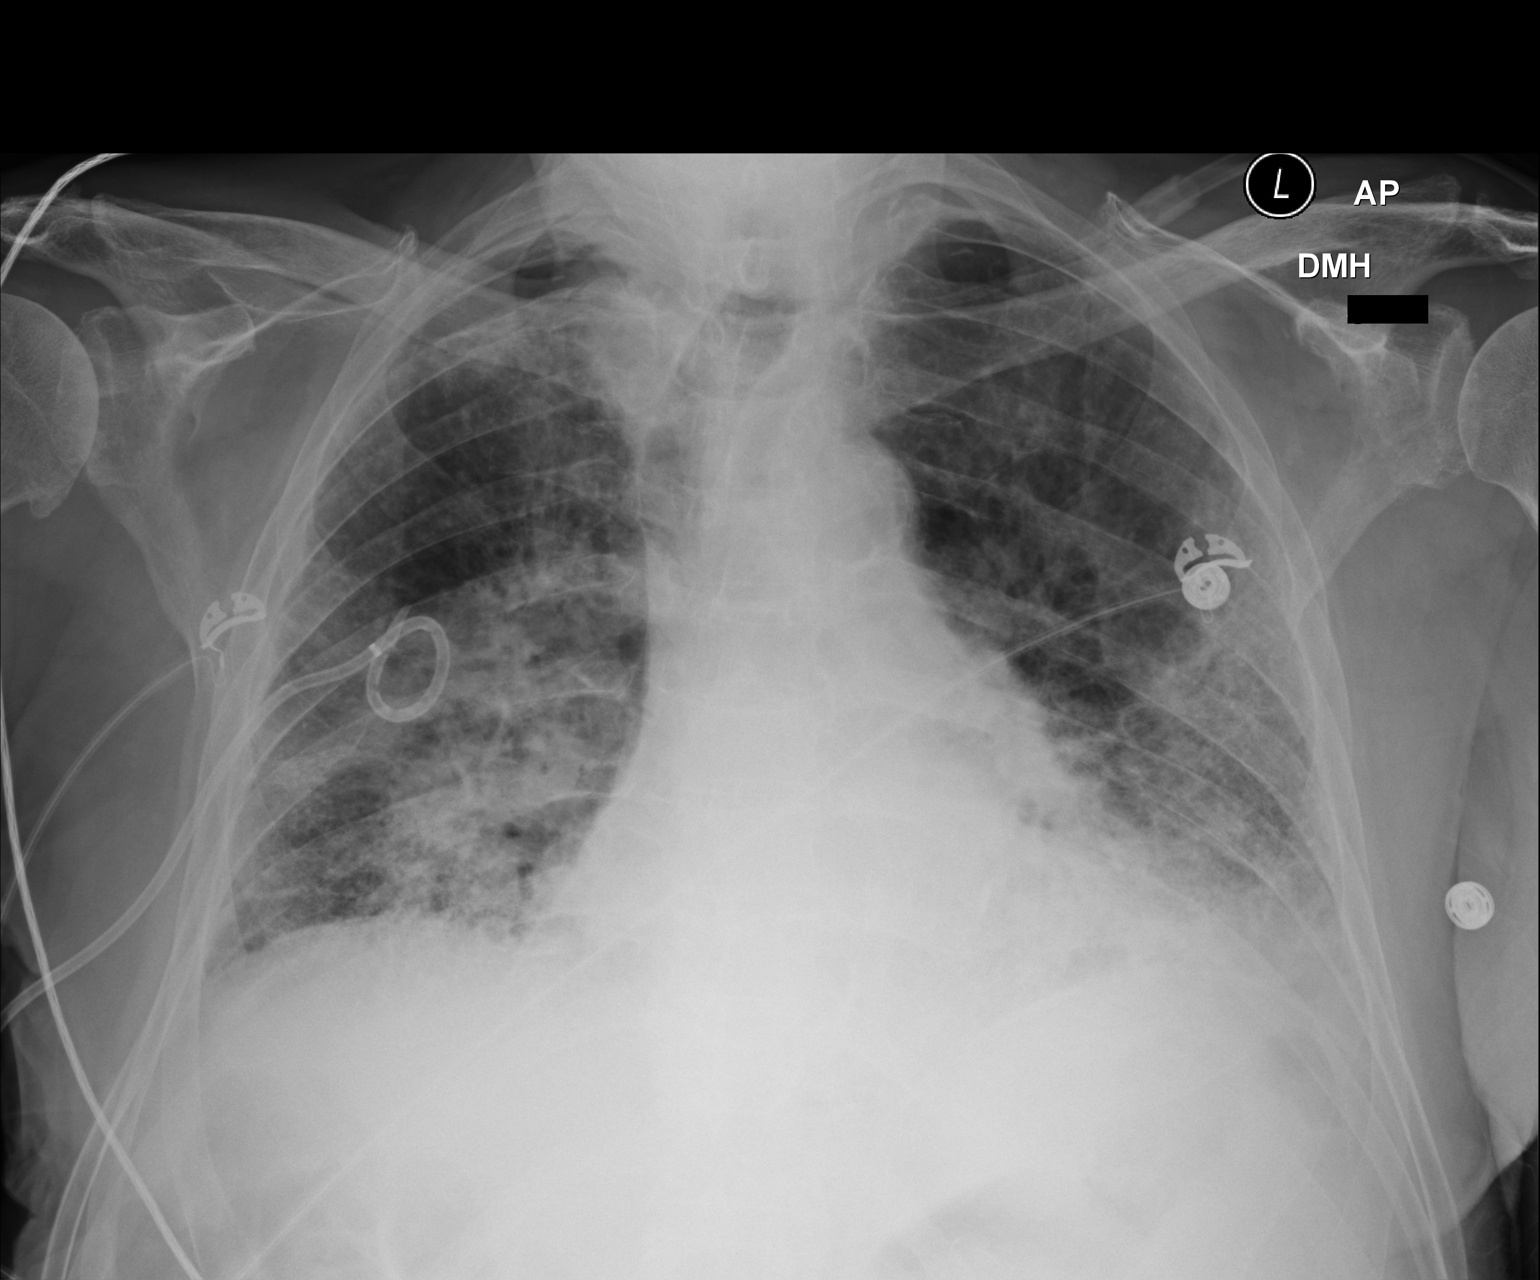

[1 of 1 positions shown; findings below may reference images not displayed]

FINDINGS: A 5% or less right-sided apical pneumothorax is observed. The
interstitial markings remain increased and are confluent in the
right mid and lower lung. There is no pleural effusion. The lung
markings rim main increased in the left mid and lower lung as well.
There is no significant pleural effusion on the left. The heart is
top-normal in size. The pulmonary vascularity is not engorged. There
is calcification in the wall of the aortic arch.
IMPRESSION: Tiny right apical pneumothorax persists. Bilateral mid and lower
lung interstitial infiltrates. No overt CHF.

Thoracic aortic atherosclerosis.
